# Patient Record
Sex: Male | Born: 1935 | Race: White | Hispanic: No | Smoking: Former smoker
Health system: Southern US, Community
[De-identification: ages and names within clinical notes are randomized; demographics above are authoritative.]

## PROBLEM LIST (undated history)

## (undated) DIAGNOSIS — T4145XA Adverse effect of unspecified anesthetic, initial encounter: Secondary | ICD-10-CM

## (undated) DIAGNOSIS — J309 Allergic rhinitis, unspecified: Secondary | ICD-10-CM

## (undated) DIAGNOSIS — Z87442 Personal history of urinary calculi: Secondary | ICD-10-CM

## (undated) DIAGNOSIS — S42002A Fracture of unspecified part of left clavicle, initial encounter for closed fracture: Secondary | ICD-10-CM

## (undated) DIAGNOSIS — E039 Hypothyroidism, unspecified: Secondary | ICD-10-CM

## (undated) DIAGNOSIS — K5790 Diverticulosis of intestine, part unspecified, without perforation or abscess without bleeding: Secondary | ICD-10-CM

## (undated) DIAGNOSIS — T8859XA Other complications of anesthesia, initial encounter: Secondary | ICD-10-CM

## (undated) DIAGNOSIS — I4510 Unspecified right bundle-branch block: Secondary | ICD-10-CM

## (undated) DIAGNOSIS — S329XXA Fracture of unspecified parts of lumbosacral spine and pelvis, initial encounter for closed fracture: Secondary | ICD-10-CM

## (undated) DIAGNOSIS — G4700 Insomnia, unspecified: Secondary | ICD-10-CM

## (undated) DIAGNOSIS — N200 Calculus of kidney: Secondary | ICD-10-CM

## (undated) DIAGNOSIS — H9190 Unspecified hearing loss, unspecified ear: Secondary | ICD-10-CM

## (undated) DIAGNOSIS — H269 Unspecified cataract: Secondary | ICD-10-CM

## (undated) DIAGNOSIS — E785 Hyperlipidemia, unspecified: Secondary | ICD-10-CM

## (undated) DIAGNOSIS — H4720 Unspecified optic atrophy: Secondary | ICD-10-CM

## (undated) DIAGNOSIS — I8390 Asymptomatic varicose veins of unspecified lower extremity: Secondary | ICD-10-CM

## (undated) DIAGNOSIS — M1990 Unspecified osteoarthritis, unspecified site: Secondary | ICD-10-CM

## (undated) HISTORY — DX: Fracture of unspecified part of left clavicle, initial encounter for closed fracture: S42.002A

## (undated) HISTORY — PX: INGUINAL HERNIA REPAIR: SUR1180

## (undated) HISTORY — DX: Unspecified optic atrophy: H47.20

## (undated) HISTORY — DX: Asymptomatic varicose veins of unspecified lower extremity: I83.90

## (undated) HISTORY — DX: Fracture of unspecified parts of lumbosacral spine and pelvis, initial encounter for closed fracture: S32.9XXA

## (undated) HISTORY — DX: Calculus of kidney: N20.0

## (undated) HISTORY — DX: Unspecified cataract: H26.9

## (undated) HISTORY — PX: THYROIDECTOMY: SHX17

## (undated) HISTORY — DX: Unspecified right bundle-branch block: I45.10

## (undated) HISTORY — DX: Hypothyroidism, unspecified: E03.9

## (undated) HISTORY — DX: Insomnia, unspecified: G47.00

## (undated) HISTORY — PX: OTHER SURGICAL HISTORY: SHX169

## (undated) HISTORY — DX: Allergic rhinitis, unspecified: J30.9

## (undated) HISTORY — DX: Hyperlipidemia, unspecified: E78.5

## (undated) HISTORY — DX: Unspecified osteoarthritis, unspecified site: M19.90

## (undated) HISTORY — DX: Unspecified hearing loss, unspecified ear: H91.90

## (undated) HISTORY — DX: Diverticulosis of intestine, part unspecified, without perforation or abscess without bleeding: K57.90

## (undated) SURGERY — Surgical Case
Anesthesia: *Unknown

---
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
## 1999-07-12 ENCOUNTER — Encounter: Payer: Self-pay | Admitting: Emergency Medicine

## 1999-07-12 ENCOUNTER — Inpatient Hospital Stay (HOSPITAL_COMMUNITY): Admission: EM | Admit: 1999-07-12 | Discharge: 1999-07-16 | Payer: Self-pay | Admitting: Emergency Medicine

## 1999-07-12 ENCOUNTER — Encounter: Payer: Self-pay | Admitting: Orthopedic Surgery

## 1999-07-13 ENCOUNTER — Encounter: Payer: Self-pay | Admitting: Orthopedic Surgery

## 2001-04-04 ENCOUNTER — Encounter: Payer: Self-pay | Admitting: Internal Medicine

## 2001-04-04 ENCOUNTER — Encounter: Admission: RE | Admit: 2001-04-04 | Discharge: 2001-04-04 | Payer: Self-pay | Admitting: Internal Medicine

## 2009-02-01 ENCOUNTER — Encounter: Admission: RE | Admit: 2009-02-01 | Discharge: 2009-02-01 | Payer: Self-pay | Admitting: Rheumatology

## 2009-03-30 HISTORY — PX: AORTIC VALVE REPLACEMENT: SHX41

## 2009-09-10 ENCOUNTER — Ambulatory Visit: Payer: Self-pay | Admitting: Surgery

## 2009-09-20 ENCOUNTER — Ambulatory Visit (HOSPITAL_COMMUNITY): Admission: RE | Admit: 2009-09-20 | Discharge: 2009-09-20 | Payer: Self-pay | Admitting: Cardiology

## 2009-10-29 ENCOUNTER — Ambulatory Visit: Payer: Self-pay | Admitting: Surgery

## 2009-10-30 ENCOUNTER — Encounter: Payer: Self-pay | Admitting: Surgery

## 2009-10-31 ENCOUNTER — Ambulatory Visit: Payer: Self-pay | Admitting: Surgery

## 2009-10-31 ENCOUNTER — Encounter: Payer: Self-pay | Admitting: Surgery

## 2009-10-31 ENCOUNTER — Inpatient Hospital Stay (HOSPITAL_COMMUNITY): Admission: RE | Admit: 2009-10-31 | Discharge: 2009-11-05 | Payer: Self-pay | Admitting: Surgery

## 2009-12-03 ENCOUNTER — Encounter: Admission: RE | Admit: 2009-12-03 | Discharge: 2009-12-03 | Payer: Self-pay | Admitting: Surgery

## 2009-12-03 ENCOUNTER — Ambulatory Visit: Payer: Self-pay | Admitting: Surgery

## 2010-01-02 ENCOUNTER — Encounter (HOSPITAL_COMMUNITY)
Admission: RE | Admit: 2010-01-02 | Discharge: 2010-04-02 | Payer: Self-pay | Source: Home / Self Care | Attending: Cardiology | Admitting: Cardiology

## 2010-04-03 ENCOUNTER — Encounter (HOSPITAL_COMMUNITY)
Admission: RE | Admit: 2010-04-03 | Discharge: 2010-04-29 | Payer: Self-pay | Source: Home / Self Care | Attending: Cardiology | Admitting: Cardiology

## 2010-04-30 ENCOUNTER — Ambulatory Visit (HOSPITAL_COMMUNITY): Payer: Self-pay

## 2010-05-02 ENCOUNTER — Ambulatory Visit (HOSPITAL_COMMUNITY): Payer: Self-pay

## 2010-05-05 ENCOUNTER — Ambulatory Visit (HOSPITAL_COMMUNITY): Payer: Self-pay

## 2010-05-07 ENCOUNTER — Ambulatory Visit (HOSPITAL_COMMUNITY): Payer: Self-pay

## 2010-05-09 ENCOUNTER — Ambulatory Visit (HOSPITAL_COMMUNITY): Payer: Self-pay

## 2010-05-12 ENCOUNTER — Ambulatory Visit (HOSPITAL_COMMUNITY): Payer: Self-pay

## 2010-05-14 ENCOUNTER — Ambulatory Visit (HOSPITAL_COMMUNITY): Payer: Self-pay

## 2010-05-16 ENCOUNTER — Ambulatory Visit (HOSPITAL_COMMUNITY): Payer: Self-pay

## 2010-05-19 ENCOUNTER — Ambulatory Visit (HOSPITAL_COMMUNITY): Payer: Self-pay

## 2010-05-21 ENCOUNTER — Ambulatory Visit (HOSPITAL_COMMUNITY): Payer: Self-pay

## 2010-05-23 ENCOUNTER — Ambulatory Visit (HOSPITAL_COMMUNITY): Payer: Self-pay

## 2010-05-26 ENCOUNTER — Ambulatory Visit (HOSPITAL_COMMUNITY): Payer: Self-pay

## 2010-05-28 ENCOUNTER — Ambulatory Visit (HOSPITAL_COMMUNITY): Payer: Self-pay

## 2010-05-30 ENCOUNTER — Ambulatory Visit (HOSPITAL_COMMUNITY): Payer: Self-pay

## 2010-06-02 ENCOUNTER — Ambulatory Visit (HOSPITAL_COMMUNITY): Payer: Self-pay

## 2010-06-04 ENCOUNTER — Ambulatory Visit (HOSPITAL_COMMUNITY): Payer: Self-pay

## 2010-06-06 ENCOUNTER — Ambulatory Visit (HOSPITAL_COMMUNITY): Payer: Self-pay

## 2010-06-09 ENCOUNTER — Ambulatory Visit (HOSPITAL_COMMUNITY): Payer: Self-pay

## 2010-06-11 ENCOUNTER — Ambulatory Visit (HOSPITAL_COMMUNITY): Payer: Self-pay

## 2010-06-13 ENCOUNTER — Ambulatory Visit (HOSPITAL_COMMUNITY): Payer: Self-pay

## 2010-06-13 LAB — BASIC METABOLIC PANEL
BUN: 14 mg/dL (ref 6–23)
BUN: 17 mg/dL (ref 6–23)
BUN: 21 mg/dL (ref 6–23)
BUN: 22 mg/dL (ref 6–23)
BUN: 23 mg/dL (ref 6–23)
BUN: 24 mg/dL — ABNORMAL HIGH (ref 6–23)
CO2: 25 mEq/L (ref 19–32)
CO2: 26 mEq/L (ref 19–32)
CO2: 26 mEq/L (ref 19–32)
CO2: 27 mEq/L (ref 19–32)
CO2: 27 mEq/L (ref 19–32)
CO2: 30 mEq/L (ref 19–32)
Calcium: 7.6 mg/dL — ABNORMAL LOW (ref 8.4–10.5)
Calcium: 7.6 mg/dL — ABNORMAL LOW (ref 8.4–10.5)
Calcium: 7.7 mg/dL — ABNORMAL LOW (ref 8.4–10.5)
Calcium: 7.8 mg/dL — ABNORMAL LOW (ref 8.4–10.5)
Calcium: 7.9 mg/dL — ABNORMAL LOW (ref 8.4–10.5)
Calcium: 8.6 mg/dL (ref 8.4–10.5)
Chloride: 104 mEq/L (ref 96–112)
Chloride: 106 mEq/L (ref 96–112)
Chloride: 108 mEq/L (ref 96–112)
Chloride: 108 mEq/L (ref 96–112)
Chloride: 109 mEq/L (ref 96–112)
Chloride: 112 mEq/L (ref 96–112)
Creatinine, Ser: 1.04 mg/dL (ref 0.4–1.5)
Creatinine, Ser: 1.08 mg/dL (ref 0.4–1.5)
Creatinine, Ser: 1.11 mg/dL (ref 0.4–1.5)
Creatinine, Ser: 1.12 mg/dL (ref 0.4–1.5)
Creatinine, Ser: 1.18 mg/dL (ref 0.4–1.5)
Creatinine, Ser: 1.29 mg/dL (ref 0.4–1.5)
GFR calc Af Amer: >60 mL/min (ref >60)
GFR calc Af Amer: >60 mL/min (ref >60)
GFR calc Af Amer: >60 mL/min (ref >60)
GFR calc Af Amer: >60 mL/min (ref >60)
GFR calc Af Amer: >60 mL/min (ref >60)
GFR calc Af Amer: >60 mL/min (ref >60)
GFR calc non Af Amer: 53 mL/min — ABNORMAL LOW (ref >60)
GFR calc non Af Amer: 59 mL/min — ABNORMAL LOW (ref >60)
GFR calc non Af Amer: >60 mL/min (ref >60)
GFR calc non Af Amer: >60 mL/min (ref >60)
GFR calc non Af Amer: >60 mL/min (ref >60)
GFR calc non Af Amer: >60 mL/min (ref >60)
Glucose, Bld: 118 mg/dL — ABNORMAL HIGH (ref 70–99)
Glucose, Bld: 119 mg/dL — ABNORMAL HIGH (ref 70–99)
Glucose, Bld: 120 mg/dL — ABNORMAL HIGH (ref 70–99)
Glucose, Bld: 128 mg/dL — ABNORMAL HIGH (ref 70–99)
Glucose, Bld: 129 mg/dL — ABNORMAL HIGH (ref 70–99)
Glucose, Bld: 143 mg/dL — ABNORMAL HIGH (ref 70–99)
Potassium: 3.5 mEq/L (ref 3.5–5.1)
Potassium: 3.6 mEq/L (ref 3.5–5.1)
Potassium: 3.7 mEq/L (ref 3.5–5.1)
Potassium: 3.9 mEq/L (ref 3.5–5.1)
Potassium: 3.9 mEq/L (ref 3.5–5.1)
Potassium: 3.9 mEq/L (ref 3.5–5.1)
Sodium: 139 mEq/L (ref 135–145)
Sodium: 139 mEq/L (ref 135–145)
Sodium: 141 mEq/L (ref 135–145)
Sodium: 141 mEq/L (ref 135–145)
Sodium: 143 mEq/L (ref 135–145)
Sodium: 144 mEq/L (ref 135–145)

## 2010-06-13 LAB — PLATELET COUNT: Platelets: 77 10*3/uL — ABNORMAL LOW (ref 150–400)

## 2010-06-13 LAB — CBC
HCT: 26.4 % — ABNORMAL LOW (ref 39.0–52.0)
HCT: 27.9 % — ABNORMAL LOW (ref 39.0–52.0)
HCT: 28.2 % — ABNORMAL LOW (ref 39.0–52.0)
HCT: 28.3 % — ABNORMAL LOW (ref 39.0–52.0)
HCT: 30.2 % — ABNORMAL LOW (ref 39.0–52.0)
HCT: 30.4 % — ABNORMAL LOW (ref 39.0–52.0)
HCT: 31.6 % — ABNORMAL LOW (ref 39.0–52.0)
HCT: 42.2 % (ref 39.0–52.0)
Hemoglobin: 10.5 g/dL — ABNORMAL LOW (ref 13.0–17.0)
Hemoglobin: 14.3 g/dL (ref 13.0–17.0)
Hemoglobin: 8.7 g/dL — ABNORMAL LOW (ref 13.0–17.0)
Hemoglobin: 9.3 g/dL — ABNORMAL LOW (ref 13.0–17.0)
Hemoglobin: 9.3 g/dL — ABNORMAL LOW (ref 13.0–17.0)
Hemoglobin: 9.5 g/dL — ABNORMAL LOW (ref 13.0–17.0)
Hemoglobin: 9.9 g/dL — ABNORMAL LOW (ref 13.0–17.0)
Hemoglobin: 9.9 g/dL — ABNORMAL LOW (ref 13.0–17.0)
MCH: 28.5 pg (ref 26.0–34.0)
MCH: 28.7 pg (ref 26.0–34.0)
MCH: 28.9 pg (ref 26.0–34.0)
MCH: 28.9 pg (ref 26.0–34.0)
MCH: 29 pg (ref 26.0–34.0)
MCH: 29 pg (ref 26.0–34.0)
MCH: 29.1 pg (ref 26.0–34.0)
MCH: 29.1 pg (ref 26.0–34.0)
MCHC: 32.6 g/dL (ref 30.0–36.0)
MCHC: 32.8 g/dL (ref 30.0–36.0)
MCHC: 32.9 g/dL (ref 30.0–36.0)
MCHC: 33 g/dL (ref 30.0–36.0)
MCHC: 33.2 g/dL (ref 30.0–36.0)
MCHC: 33.3 g/dL (ref 30.0–36.0)
MCHC: 33.7 g/dL (ref 30.0–36.0)
MCHC: 33.9 g/dL (ref 30.0–36.0)
MCV: 85.9 fL (ref 78.0–100.0)
MCV: 85.9 fL (ref 78.0–100.0)
MCV: 86 fL (ref 78.0–100.0)
MCV: 86.6 fL (ref 78.0–100.0)
MCV: 87.7 fL (ref 78.0–100.0)
MCV: 88.1 fL (ref 78.0–100.0)
MCV: 88.2 fL (ref 78.0–100.0)
MCV: 88.8 fL (ref 78.0–100.0)
Platelets: 108 10*3/uL — ABNORMAL LOW (ref 150–400)
Platelets: 125 10*3/uL — ABNORMAL LOW (ref 150–400)
Platelets: 72 10*3/uL — ABNORMAL LOW (ref 150–400)
Platelets: 84 10*3/uL — ABNORMAL LOW (ref 150–400)
Platelets: 89 10*3/uL — ABNORMAL LOW (ref 150–400)
Platelets: 90 10*3/uL — ABNORMAL LOW (ref 150–400)
Platelets: 98 10*3/uL — ABNORMAL LOW (ref 150–400)
Platelets: 99 10*3/uL — ABNORMAL LOW (ref 150–400)
RBC: 3.01 MIL/uL — ABNORMAL LOW (ref 4.22–5.81)
RBC: 3.21 MIL/uL — ABNORMAL LOW (ref 4.22–5.81)
RBC: 3.22 MIL/uL — ABNORMAL LOW (ref 4.22–5.81)
RBC: 3.28 MIL/uL — ABNORMAL LOW (ref 4.22–5.81)
RBC: 3.4 MIL/uL — ABNORMAL LOW (ref 4.22–5.81)
RBC: 3.45 MIL/uL — ABNORMAL LOW (ref 4.22–5.81)
RBC: 3.68 MIL/uL — ABNORMAL LOW (ref 4.22–5.81)
RBC: 4.91 MIL/uL (ref 4.22–5.81)
RDW: 14 % (ref 11.5–15.5)
RDW: 14 % (ref 11.5–15.5)
RDW: 14.1 % (ref 11.5–15.5)
RDW: 14.5 % (ref 11.5–15.5)
RDW: 14.7 % (ref 11.5–15.5)
RDW: 14.7 % (ref 11.5–15.5)
RDW: 14.8 % (ref 11.5–15.5)
RDW: 15 % (ref 11.5–15.5)
WBC: 12.1 10*3/uL — ABNORMAL HIGH (ref 4.0–10.5)
WBC: 12.9 10*3/uL — ABNORMAL HIGH (ref 4.0–10.5)
WBC: 6.5 10*3/uL (ref 4.0–10.5)
WBC: 7 10*3/uL (ref 4.0–10.5)
WBC: 8.3 10*3/uL (ref 4.0–10.5)
WBC: 8.9 10*3/uL (ref 4.0–10.5)
WBC: 9.2 10*3/uL (ref 4.0–10.5)
WBC: 9.7 10*3/uL (ref 4.0–10.5)

## 2010-06-13 LAB — HEMOGLOBIN A1C
Hgb A1c MFr Bld: 5.8 % — ABNORMAL HIGH (ref <5.7)
Mean Plasma Glucose: 120 mg/dL — ABNORMAL HIGH (ref <117)

## 2010-06-13 LAB — GLUCOSE, CAPILLARY
Glucose-Capillary: 100 mg/dL — ABNORMAL HIGH (ref 70–99)
Glucose-Capillary: 100 mg/dL — ABNORMAL HIGH (ref 70–99)
Glucose-Capillary: 105 mg/dL — ABNORMAL HIGH (ref 70–99)
Glucose-Capillary: 108 mg/dL — ABNORMAL HIGH (ref 70–99)
Glucose-Capillary: 117 mg/dL — ABNORMAL HIGH (ref 70–99)
Glucose-Capillary: 120 mg/dL — ABNORMAL HIGH (ref 70–99)
Glucose-Capillary: 132 mg/dL — ABNORMAL HIGH (ref 70–99)
Glucose-Capillary: 132 mg/dL — ABNORMAL HIGH (ref 70–99)
Glucose-Capillary: 136 mg/dL — ABNORMAL HIGH (ref 70–99)
Glucose-Capillary: 138 mg/dL — ABNORMAL HIGH (ref 70–99)
Glucose-Capillary: 152 mg/dL — ABNORMAL HIGH (ref 70–99)
Glucose-Capillary: 179 mg/dL — ABNORMAL HIGH (ref 70–99)
Glucose-Capillary: 208 mg/dL — ABNORMAL HIGH (ref 70–99)

## 2010-06-13 LAB — POCT I-STAT 3, ART BLOOD GAS (G3+)
Acid-base deficit: 1 mmol/L (ref 0.0–2.0)
Acid-base deficit: 1 mmol/L (ref 0.0–2.0)
Acid-base deficit: 2 mmol/L (ref 0.0–2.0)
Bicarbonate: 23.2 mEq/L (ref 20.0–24.0)
Bicarbonate: 23.5 mEq/L (ref 20.0–24.0)
Bicarbonate: 24.1 mEq/L — ABNORMAL HIGH (ref 20.0–24.0)
O2 Saturation: 100 %
O2 Saturation: 94 %
O2 Saturation: 96 %
Patient temperature: 35.4
Patient temperature: 36.9
TCO2: 24 mmol/L (ref 0–100)
TCO2: 25 mmol/L (ref 0–100)
TCO2: 25 mmol/L (ref 0–100)
pCO2 arterial: 35.8 mmHg (ref 35.0–45.0)
pCO2 arterial: 36.3 mmHg (ref 35.0–45.0)
pCO2 arterial: 39.9 mmHg (ref 35.0–45.0)
pH, Arterial: 7.373 (ref 7.350–7.450)
pH, Arterial: 7.422 (ref 7.350–7.450)
pH, Arterial: 7.425 (ref 7.350–7.450)
pO2, Arterial: 378 mmHg — ABNORMAL HIGH (ref 80.0–100.0)
pO2, Arterial: 65 mmHg — ABNORMAL LOW (ref 80.0–100.0)
pO2, Arterial: 83 mmHg (ref 80.0–100.0)

## 2010-06-13 LAB — URINALYSIS, ROUTINE W REFLEX MICROSCOPIC
Glucose, UA: NEGATIVE mg/dL
Hgb urine dipstick: NEGATIVE
Ketones, ur: 15 mg/dL — AB
Nitrite: NEGATIVE
Protein, ur: NEGATIVE mg/dL
Specific Gravity, Urine: 1.025 (ref 1.005–1.030)
Urobilinogen, UA: 1 mg/dL (ref 0.0–1.0)
pH: 5 (ref 5.0–8.0)

## 2010-06-13 LAB — TYPE AND SCREEN
ABO/RH(D): O NEG
Antibody Screen: NEGATIVE

## 2010-06-13 LAB — CREATININE, SERUM
Creatinine, Ser: 0.92 mg/dL (ref 0.4–1.5)
Creatinine, Ser: 1.28 mg/dL (ref 0.4–1.5)
GFR calc Af Amer: >60 mL/min (ref >60)
GFR calc Af Amer: >60 mL/min (ref >60)
GFR calc non Af Amer: 54 mL/min — ABNORMAL LOW (ref >60)
GFR calc non Af Amer: >60 mL/min (ref >60)

## 2010-06-13 LAB — MAGNESIUM
Magnesium: 2.4 mg/dL (ref 1.5–2.5)
Magnesium: 2.5 mg/dL (ref 1.5–2.5)
Magnesium: 2.8 mg/dL — ABNORMAL HIGH (ref 1.5–2.5)

## 2010-06-13 LAB — BLOOD GAS, ARTERIAL
Acid-base deficit: 0.5 mmol/L (ref 0.0–2.0)
Bicarbonate: 23.3 mEq/L (ref 20.0–24.0)
Drawn by: 181601
FIO2: 0.21 %
O2 Saturation: 95.8 %
Patient temperature: 98.6
TCO2: 24.4 mmol/L (ref 0–100)
pCO2 arterial: 36 mmHg (ref 35.0–45.0)
pH, Arterial: 7.428 (ref 7.350–7.450)
pO2, Arterial: 79.2 mmHg — ABNORMAL LOW (ref 80.0–100.0)

## 2010-06-13 LAB — POCT I-STAT 4, (NA,K, GLUC, HGB,HCT)
Glucose, Bld: 106 mg/dL — ABNORMAL HIGH (ref 70–99)
Glucose, Bld: 107 mg/dL — ABNORMAL HIGH (ref 70–99)
Glucose, Bld: 112 mg/dL — ABNORMAL HIGH (ref 70–99)
Glucose, Bld: 113 mg/dL — ABNORMAL HIGH (ref 70–99)
Glucose, Bld: 121 mg/dL — ABNORMAL HIGH (ref 70–99)
Glucose, Bld: 97 mg/dL (ref 70–99)
HCT: 26 % — ABNORMAL LOW (ref 39.0–52.0)
HCT: 26 % — ABNORMAL LOW (ref 39.0–52.0)
HCT: 27 % — ABNORMAL LOW (ref 39.0–52.0)
HCT: 30 % — ABNORMAL LOW (ref 39.0–52.0)
HCT: 32 % — ABNORMAL LOW (ref 39.0–52.0)
HCT: 36 % — ABNORMAL LOW (ref 39.0–52.0)
Hemoglobin: 10.2 g/dL — ABNORMAL LOW (ref 13.0–17.0)
Hemoglobin: 10.9 g/dL — ABNORMAL LOW (ref 13.0–17.0)
Hemoglobin: 12.2 g/dL — ABNORMAL LOW (ref 13.0–17.0)
Hemoglobin: 8.8 g/dL — ABNORMAL LOW (ref 13.0–17.0)
Hemoglobin: 8.8 g/dL — ABNORMAL LOW (ref 13.0–17.0)
Hemoglobin: 9.2 g/dL — ABNORMAL LOW (ref 13.0–17.0)
Potassium: 3.9 mEq/L (ref 3.5–5.1)
Potassium: 3.9 mEq/L (ref 3.5–5.1)
Potassium: 4 mEq/L (ref 3.5–5.1)
Potassium: 4 mEq/L (ref 3.5–5.1)
Potassium: 4.5 mEq/L (ref 3.5–5.1)
Potassium: 6 mEq/L — ABNORMAL HIGH (ref 3.5–5.1)
Sodium: 135 mEq/L (ref 135–145)
Sodium: 136 mEq/L (ref 135–145)
Sodium: 139 mEq/L (ref 135–145)
Sodium: 141 mEq/L (ref 135–145)
Sodium: 141 mEq/L (ref 135–145)
Sodium: 141 mEq/L (ref 135–145)

## 2010-06-13 LAB — HEMOGLOBIN AND HEMATOCRIT, BLOOD
HCT: 26.5 % — ABNORMAL LOW (ref 39.0–52.0)
Hemoglobin: 9 g/dL — ABNORMAL LOW (ref 13.0–17.0)

## 2010-06-13 LAB — COMPREHENSIVE METABOLIC PANEL
ALT: 20 U/L (ref 0–53)
AST: 27 U/L (ref 0–37)
Albumin: 3.8 g/dL (ref 3.5–5.2)
Alkaline Phosphatase: 70 U/L (ref 39–117)
BUN: 18 mg/dL (ref 6–23)
CO2: 22 mEq/L (ref 19–32)
Calcium: 9.1 mg/dL (ref 8.4–10.5)
Chloride: 109 mEq/L (ref 96–112)
Creatinine, Ser: 1 mg/dL (ref 0.4–1.5)
GFR calc Af Amer: >60 mL/min (ref >60)
GFR calc non Af Amer: >60 mL/min (ref >60)
Glucose, Bld: 137 mg/dL — ABNORMAL HIGH (ref 70–99)
Potassium: 4.3 mEq/L (ref 3.5–5.1)
Sodium: 138 mEq/L (ref 135–145)
Total Bilirubin: 0.7 mg/dL (ref 0.3–1.2)
Total Protein: 6.8 g/dL (ref 6.0–8.3)

## 2010-06-13 LAB — POCT I-STAT, CHEM 8
BUN: 14 mg/dL (ref 6–23)
Calcium, Ion: 1.11 mmol/L — ABNORMAL LOW (ref 1.12–1.32)
Chloride: 109 mEq/L (ref 96–112)
Creatinine, Ser: 1 mg/dL (ref 0.4–1.5)
Glucose, Bld: 143 mg/dL — ABNORMAL HIGH (ref 70–99)
HCT: 25 % — ABNORMAL LOW (ref 39.0–52.0)
Hemoglobin: 8.5 g/dL — ABNORMAL LOW (ref 13.0–17.0)
Potassium: 3.9 mEq/L (ref 3.5–5.1)
Sodium: 140 mEq/L (ref 135–145)
TCO2: 22 mmol/L (ref 0–100)

## 2010-06-13 LAB — SURGICAL PCR SCREEN
MRSA, PCR: NEGATIVE
Staphylococcus aureus: NEGATIVE

## 2010-06-13 LAB — PREPARE PLATELETS

## 2010-06-13 LAB — PROTIME-INR
INR: 1.08 (ref 0.00–1.49)
INR: 1.84 — ABNORMAL HIGH (ref 0.00–1.49)
Prothrombin Time: 14.2 seconds (ref 11.6–15.2)
Prothrombin Time: 21.4 seconds — ABNORMAL HIGH (ref 11.6–15.2)

## 2010-06-13 LAB — APTT
aPTT: 26 seconds (ref 24–37)
aPTT: 42 seconds — ABNORMAL HIGH (ref 24–37)

## 2010-06-13 LAB — ABO/RH: ABO/RH(D): O NEG

## 2010-06-15 LAB — POCT I-STAT 3, ART BLOOD GAS (G3+)
Bicarbonate: 23.9 mEq/L (ref 20.0–24.0)
O2 Saturation: 94 %
TCO2: 25 mmol/L (ref 0–100)
pCO2 arterial: 34.9 mmHg — ABNORMAL LOW (ref 35.0–45.0)
pH, Arterial: 7.444 (ref 7.350–7.450)
pO2, Arterial: 66 mmHg — ABNORMAL LOW (ref 80.0–100.0)

## 2010-06-15 LAB — POCT I-STAT 3, VENOUS BLOOD GAS (G3P V)
Acid-Base Excess: 1 mmol/L (ref 0.0–2.0)
Bicarbonate: 26 mEq/L — ABNORMAL HIGH (ref 20.0–24.0)
O2 Saturation: 60 %
TCO2: 27 mmol/L (ref 0–100)
pCO2, Ven: 43.5 mmHg — ABNORMAL LOW (ref 45.0–50.0)
pH, Ven: 7.385 — ABNORMAL HIGH (ref 7.250–7.300)
pO2, Ven: 32 mmHg (ref 30.0–45.0)

## 2010-06-16 ENCOUNTER — Ambulatory Visit (HOSPITAL_COMMUNITY): Payer: Self-pay

## 2010-06-18 ENCOUNTER — Ambulatory Visit (HOSPITAL_COMMUNITY): Payer: Self-pay

## 2010-06-20 ENCOUNTER — Ambulatory Visit (HOSPITAL_COMMUNITY): Payer: Self-pay

## 2010-06-23 ENCOUNTER — Ambulatory Visit (HOSPITAL_COMMUNITY): Payer: Self-pay

## 2010-06-25 ENCOUNTER — Ambulatory Visit (HOSPITAL_COMMUNITY): Payer: Self-pay

## 2010-06-27 ENCOUNTER — Ambulatory Visit (HOSPITAL_COMMUNITY): Payer: Self-pay

## 2010-06-30 ENCOUNTER — Ambulatory Visit (HOSPITAL_COMMUNITY): Payer: Self-pay

## 2010-07-02 ENCOUNTER — Ambulatory Visit (HOSPITAL_COMMUNITY): Payer: Self-pay

## 2010-07-04 ENCOUNTER — Ambulatory Visit (HOSPITAL_COMMUNITY): Payer: Self-pay

## 2010-07-07 ENCOUNTER — Ambulatory Visit (HOSPITAL_COMMUNITY): Payer: Self-pay

## 2010-07-09 ENCOUNTER — Ambulatory Visit (HOSPITAL_COMMUNITY): Payer: Self-pay

## 2010-07-11 ENCOUNTER — Ambulatory Visit (HOSPITAL_COMMUNITY): Payer: Self-pay

## 2010-07-14 ENCOUNTER — Ambulatory Visit (HOSPITAL_COMMUNITY): Payer: Self-pay

## 2010-07-16 ENCOUNTER — Ambulatory Visit (HOSPITAL_COMMUNITY): Payer: Self-pay

## 2010-07-18 ENCOUNTER — Ambulatory Visit (HOSPITAL_COMMUNITY): Payer: Self-pay

## 2010-07-21 ENCOUNTER — Ambulatory Visit (HOSPITAL_COMMUNITY): Payer: Self-pay

## 2010-07-23 ENCOUNTER — Ambulatory Visit (HOSPITAL_COMMUNITY): Payer: Self-pay

## 2010-07-25 ENCOUNTER — Ambulatory Visit (HOSPITAL_COMMUNITY): Payer: Self-pay

## 2010-07-28 ENCOUNTER — Ambulatory Visit (HOSPITAL_COMMUNITY): Payer: Self-pay

## 2010-07-30 ENCOUNTER — Ambulatory Visit (HOSPITAL_COMMUNITY): Payer: Self-pay

## 2010-08-01 ENCOUNTER — Ambulatory Visit (HOSPITAL_COMMUNITY): Payer: Self-pay

## 2010-08-04 ENCOUNTER — Ambulatory Visit (HOSPITAL_COMMUNITY): Payer: Self-pay

## 2010-08-06 ENCOUNTER — Ambulatory Visit (HOSPITAL_COMMUNITY): Payer: Self-pay

## 2010-08-08 ENCOUNTER — Ambulatory Visit (HOSPITAL_COMMUNITY): Payer: Self-pay

## 2010-08-11 ENCOUNTER — Ambulatory Visit (HOSPITAL_COMMUNITY): Payer: Self-pay

## 2010-08-12 NOTE — Consult Note (Signed)
NEW PATIENT CONSULTATION   ELJAY, LAVE  DOB:  [birth year]                                        September 11, 2009  CHART #:  [id number]   REASON FOR CONSULTATION:  Severe aortic stenosis.   CLINICAL HISTORY:  I was asked by Dr. Anne Fu to evaluate the patient for  consideration of aortic valve replacement.  He is an 77 year old  gentleman with a long history of heart murmur, who says he was referred  to Dr. Anne Fu recently by his primary physician, Dr. Donette Larry, due to  worsening of his heart murmur.  He had an echocardiogram performed on  July 26, 2009, which showed mild concentric left ventricular  hypertrophy with severe aortic stenosis with a peak gradient of 4 meters  per second and mean gradient of 35 mmHg with severe thickening and  calcification of the aortic valve.  When compared to a previous  echocardiogram in 2008, the aortic stenosis had worsened and was now  severe with an aortic valve area 0.85 sq. cm.  There was mild aortic  insufficiency, mild mitral annular calcification with trace mitral  regurgitation, and trivial tricuspid regurgitation.  He also underwent  an exercise treadmill test which only lasted about two and half minutes  and he had severely decreased exercise tolerance.   REVIEW OF SYSTEMS:  GENERAL:  He denies any fever or chills.  He has had  no recent weight changes.  He does report fatigue.  EYES:  Negative.  ENT:  Negative.  He does see a dentist regularly every 6 months.  ENDOCRINE:  He denies diabetes.  He does have a history of  hypothyroidism.  CARDIOVASCULAR:  He denies any chest pain or pressure.  He has had  exertional dyspnea.  Denies PND and orthopnea.  He denies peripheral  edema and palpitations.  RESPIRATORY:  He denies cough and sputum production.  He does have a  history of asthma with some wheezing and bronchitis.  GI:  He denies nausea or vomiting.  Denies melena and bright red blood  per rectum.  He does have  a history of reflux.  GU:  He denies dysuria and hematuria.  NEUROLOGIC:  Denies any focal weakness or numbness.  Denies dizziness  and syncope.  He has never had a TIA or a stroke.  He does have  headaches.  VASCULAR:  He denies claudication.  He does have a history of varicose  veins and phlebitis.  PSYCHIATRIC:  Negative.  HEMATOLOGIC:  Negative.  MUSCULOSKELETAL:  He does have arthritis.   ALLERGIES:  Penicillin causes a rash and hives and Aleve causes a rash.   MEDICATIONS:  1. Albuterol nebulizer p.r.n.  2. Aspirin 81 mg daily.  3. Saw palmetto 500 mg daily.  4. Zyrtec 10 mg daily.  5. Lipitor 10 mg daily.  6. Synthroid 88 mcg daily.  7. Zolpidem 5 mg daily.  8. Fiber capsule daily.  9. Celebrex 200 mg p.r.n.   PAST MEDICAL HISTORY:  Significant for aortic stenosis.  He has a  history of hypercholesterolemia.  He has a history of hypothyroidism.  He has a history of arthritis.  He has a history of diverticulosis.  He  has history of varicose veins.  He had trauma in the past with a pelvic  fracture and a left clavicle fracture.  He  has a history hearing loss  and wears hearing aids.  He has history of kidney stones.  He has  history of cataracts and had surgery in 2010.  He is status post surgery  for a thyroid goiter in the distant past.   SOCIAL HISTORY:  He is married and has 3 children.  He is retired.  He  is a previous smoker but quit in 1971.  Denies alcohol use.   FAMILY HISTORY:  Negative for cardiac or valve disease.   PHYSICAL EXAMINATION:  Blood pressure is 139/85, pulse is 62 and  regular, respiratory rate is 18 and unlabored.  Oxygen saturation on  room air is 93%.  He is a well-developed elderly white male in no  distress.  HEENT exam shows him to be normocephalic and atraumatic.  Pupils are equal and reactive to light and accommodation.  Extraocular  muscles are intact.  Throat is clear.  Teeth are in good condition.  Neck exam shows normal carotid  pulses bilaterally.  There is a  transmitted murmur to both sides of his neck.  There is no adenopathy or  thyromegaly.  Cardiac exam shows a regular rate and rhythm with a grade  3/6 holosystolic murmur over the aorta.  There is no diastolic murmur.  His lungs are clear.  Abdominal exam shows active bowel sounds.  His  abdomen is soft and nontender.  There are no palpable masses or  organomegaly.  Extremity exam shows no peripheral edema.  He does have  bilateral varicose veins, particularly in the lower legs.  Pedal pulses  are palpable bilaterally.  Skin is warm and dry.  Neurologic exam shows  him to be alert and oriented x3.  Motor and sensory exams are grossly  normal.   IMPRESSION:  The patient has severe aortic stenosis and mild aortic  insufficiency with symptoms of exertional dyspnea and fatigue as well as  severely decreased exercise tolerance on stress testing.  He is in  overall good condition and I agree at this time to proceed with aortic  valve replacement.  He will require cardiac catheterization first, and  given his age I think it would be best to do his catheterization and  wait at least a week before doing his surgery to minimize any insult to  his kidney function.  Hopefully, he can be cathed as an outpatient or an  overnight stay and then I will see him back in the office afterwards to  discuss of surgery further with him and make operative plans.  I told  him I would like to get his surgery done as quickly as possible, but I  would like to wait at least 5 days in between his cath and the surgery  if possible.  I discussed my recommendation for a tissue valve given his  age.  We discussed benefits and risks of surgery including but not  limited to bleeding, blood transfusion, infection, stroke, myocardial  infarction, heart block requiring permanent pacemaker, organ  dysfunction, and death.  He understands all this and agrees to proceed.  I will see him back after  his catheterization.   Evelene Croon, M.D.  Electronically Signed   BB/MEDQ  D:  09/11/2009  T:  09/12/2009  Job:  [id number]   cc:   Jake Bathe, MD  Georgann Housekeeper, MD

## 2010-08-12 NOTE — Assessment & Plan Note (Signed)
OFFICE VISIT   MONTERRIUS, CARDOSA  DOB:  March 22, 1926                                        December 03, 2009  CHART #:  [id number]   HISTORY:  The patient returned to my office today for followup status  post aortic valve replacement with a 23-mm Edwards pericardial valve on  October 31, 2009.  He has been feeling well overall and is walking daily  without chest pain or shortness of breath.   PHYSICAL EXAMINATION:  Blood pressure 111/69, pulse 71 and regular,  respiratory rate is 18 unlabored.  Oxygen saturation on room air is 96%.  He looks well.  Cardiac exam shows regular rate and rhythm with normal  valve sounds.  There is no murmur.  His lungs are clear.  Chest incision  is healing well and sternum is stable.  There is no peripheral edema.   Followup chest x-ray today shows clear lung fields and no pleural  effusions.   MEDICATIONS:  Albuterol HFA b.i.d. p.r.n., aspirin 81 mg daily, Zyrtec  10 mg daily, Lipitor 10 mg daily, Synthroid 88 mcg daily, his fiber  capsule daily, Lopressor 12.5 mg b.i.d., Ultram p.r.n. for pain, Maalox  q.4 h. p.r.n. for ingestion, and saw palmetto 500 mg daily.   IMPRESSION:  Overall, the patient is making a good recovery following  his surgery.  I encouraged him to continue increasing his activity.  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  He is planning on participating an outpatient cardiac rehab.  He will follow up with Dr. Anne Fu and will contact me if he develops any  problems with his incisions.   Evelene Croon, M.D.  Electronically Signed   BB/MEDQ  D:  12/03/2009  T:  12/04/2009  Job:  [id number]   cc:   Jake Bathe, MD  Tyson Dense, MD

## 2010-08-12 NOTE — Assessment & Plan Note (Signed)
OFFICE VISIT   Jay [name], Jay [name]  DOB:  [birth year]                                        October 29, 2009  CHART #:  [id number]   HISTORY:  The patient returned to my office today for further discussion  of planned aortic valve replacement scheduled for Thursday, October 31, 2009.  He underwent cardiac catheterization on September 20, 2009, which I  have reviewed.  This showed insignificant coronary disease.  The LAD had  about 30% proximal stenosis with some ectatic portion in the mid  segment.  The left circumflex had some aneurysmal dilatation, but no  stenosis.  The right coronary artery was a small nondominant vessel.  Left ventriculogram was not performed.  Right heart cath showed normal  pulmonary pressure of 30/15 and a wedge pressure of 10/12 with a mean of  8.  Cardiac output was 4 L/min with a pulmonary artery oxygen saturation  of 60% and an aortic saturation 94%.   The patient said that he is stable symptomatically.  He has not been  doing very much lately because he does get exertional dyspnea.  He has  had no chest pain or pressure.  He denies dizziness or syncope.   PHYSICAL EXAMINATION:  Today, his blood pressure is 127/82 with pulse of  84 and regular.  Respiratory rate is 16, unlabored.  Oxygen saturation  is 93% on room air.  He looks well.  Cardiac exam shows regular rate and  rhythm with a normal S1 and S2.  There is a grade 3/6 systolic murmur  over the aorta.  There is no diastolic murmur.  His lungs are clear.  There is no peripheral edema.   IMPRESSION:  The patient is stable for aortic valve replacement on  Thursday.  He has insignificant coronary disease.  We will plan to use a  tissue valve at his age of 62.  I discussed the operative procedure with  him including alternatives, benefits, and risks including, but not  limited to bleeding, blood transfusion, infection, stroke, myocardial  infarction, heart block requiring permanent  pacemaker, and death.  He  understands all this and agrees to proceed.   Evelene Croon, M.D.  Electronically Signed   BB/MEDQ  D:  10/29/2009  T:  10/30/2009  Job:  [id number]

## 2010-08-13 ENCOUNTER — Ambulatory Visit (HOSPITAL_COMMUNITY): Payer: Self-pay

## 2010-08-15 ENCOUNTER — Ambulatory Visit (HOSPITAL_COMMUNITY): Payer: Self-pay

## 2010-08-15 NOTE — H&P (Signed)
[name]. [facility]  Patient:    Jay [name], Jay [name]                MRN: [id number] Adm. Date:  [id number] Disc. Date: [id number] Attending:  Wende Mott                         History and Physical  CHIEF COMPLAINT:  Painful left hip.  HISTORY OF PRESENT ILLNESS:  This is a 77 year old male who had fallen 8 feet from a ladder today and landed onto his left side.  The patient denies any loss of consciousness, but felt dazed for a while.  The patient states that he hit the eft side of his head, but it was not on the concrete, but on the grassy surface. The patient was brought to [facility] Emergency Room for treatment by ambulance.  PAST MEDICAL HISTORY:  Asthmatic bronchitis.  Hypothyroidism.  ALLERGIES:  Penicillin.  MEDICATIONS:  Glucosamine and Synthroid.  FAMILY HISTORY:  Noncontributory.  HABITS:  None.  REVIEW OF SYSTEMS:  No cardiac, respiratory, urinary or bowel symptoms.  PHYSICAL EXAMINATION:  VITAL SIGNS:  Temperature 97.8.  Pulse 75.  Respirations 16.  Blood pressure 138/81.  GENERAL:  Alert and oriented.  No acute distress.  NECK:  Left paracervical muscle tenderness.  Range of motion is good but guarded on rotation.  NEUROLOGIC:  Good grip and pinch in both upper extremities.  Intrinsically intact. Sensory is intact.  CHEST:  Clear.  Nontender.  ABDOMEN:  Soft.  Active bowel sounds.  Nontender.  PELVIS:  Tender in the left SI joint and left inguinal area.  BACK AND SPINE:  Dorsal spine nontender.  Lumbar spine showed left paralumbar tenderness and SI joint.  EXTREMITIES:  Straight leg raise and ______ negative.  No clonus.  Extensor hallux intact.  Left elbow abrasion.  Bruise, left forearm.  LABORATORY DATA:  X-ray revealed fracture left ischial ramus.  IMPRESSION: 1. Cervical strain. 2. Lumbar strain. 3. Strain, left sacroiliac joint. 4. Abrasion, left elbow. 5. Contusion, head, rule  out subdural. 6. Fractured pelvis, non-displaced. DD:  07/29/99 TD:  07/30/99 Job: [id number]

## 2010-08-15 NOTE — Discharge Summary (Signed)
[place]. [facility]  Patient:    Jay [name], Jay [name]                MRN: [id number] Adm. Date:  [id number] Disc. Date: [id number] Attending:  Wende Mott                           Discharge Summary  ADMITTING DIAGNOSES: 1. Fractured left pelvis. 2. Cervical strain. 3. Lumbar strain. 4. Strain, left sacroiliac joint. 5. Abrasion, right elbow. 6. Contused head, rule out subdural.  DISCHARGE DIAGNOSES: 1. Cervical strain. 2. Lumbar strain. 3. Strain, left sacroiliac joint. 4. Abrasion, right elbow. 5. Contusion, head. 6. Fractured left pelvis.  COMPLICATIONS:  None.  INFECTIONS:  None.  OPERATIONS:  None.  PERTINENT HISTORY:  This is a 77 year old male who was on a ladder and had an eight foot fall.  The patient was brought to [facility] Emergency Room for treatment.  The patient complained of left hip and pelvic pain.  PERTINENT PHYSICAL EXAMINATION:  NECK:  Paracervical muscle tenderness.  NEUROLOGIC:  Intact.  BACK AND SPINE:  Lumbar spine showed left paralumbar muscle tenderness and SI joint tenderness.  EXTREMITIES:  Abrasion about the right elbow.  Bruise about the left forearm.  LABORATORY DATA:  X-rays revealed a fractured left ischial ramus.  HOSPITAL COURSE:  The patient was placed on strict bed rest with flexion of the knee of the bed, pain medication, muscle relaxers, anti-inflammatories. The patient was started on ambulation, non weight bearing on the left side with the use of a walker and progressed quite well.  The patient became stable enough to be discharged and was discharged on Celebrex 200 mg q.d., baby aspiring q.d., Skelaxin two tablets b.i.d. moist heat and a pillow beneath the knees at home, non weight bearing on the left side with the use of crutches. The patient is to return to the office in two weeks. DD:  07/29/99 TD:  07/30/99 Job: [id number]

## 2010-08-18 ENCOUNTER — Ambulatory Visit (HOSPITAL_COMMUNITY): Payer: Self-pay

## 2010-08-20 ENCOUNTER — Ambulatory Visit (HOSPITAL_COMMUNITY): Payer: Self-pay

## 2010-08-22 ENCOUNTER — Ambulatory Visit (HOSPITAL_COMMUNITY): Payer: Self-pay

## 2010-08-25 ENCOUNTER — Ambulatory Visit (HOSPITAL_COMMUNITY): Payer: Self-pay

## 2010-08-27 ENCOUNTER — Ambulatory Visit (HOSPITAL_COMMUNITY): Payer: Self-pay

## 2010-08-29 ENCOUNTER — Ambulatory Visit (HOSPITAL_COMMUNITY): Payer: Self-pay

## 2010-09-01 ENCOUNTER — Ambulatory Visit (HOSPITAL_COMMUNITY): Payer: Self-pay

## 2011-08-11 DIAGNOSIS — E039 Hypothyroidism, unspecified: Secondary | ICD-10-CM | POA: Diagnosis not present

## 2011-08-11 DIAGNOSIS — R7309 Other abnormal glucose: Secondary | ICD-10-CM | POA: Diagnosis not present

## 2011-08-11 DIAGNOSIS — E782 Mixed hyperlipidemia: Secondary | ICD-10-CM | POA: Diagnosis not present

## 2011-08-11 DIAGNOSIS — N182 Chronic kidney disease, stage 2 (mild): Secondary | ICD-10-CM | POA: Diagnosis not present

## 2011-08-11 DIAGNOSIS — Z1331 Encounter for screening for depression: Secondary | ICD-10-CM | POA: Diagnosis not present

## 2011-08-11 DIAGNOSIS — J45909 Unspecified asthma, uncomplicated: Secondary | ICD-10-CM | POA: Diagnosis not present

## 2011-09-17 DIAGNOSIS — Z954 Presence of other heart-valve replacement: Secondary | ICD-10-CM | POA: Diagnosis not present

## 2011-09-17 DIAGNOSIS — I359 Nonrheumatic aortic valve disorder, unspecified: Secondary | ICD-10-CM | POA: Diagnosis not present

## 2011-09-17 DIAGNOSIS — E782 Mixed hyperlipidemia: Secondary | ICD-10-CM | POA: Diagnosis not present

## 2011-11-02 DIAGNOSIS — D485 Neoplasm of uncertain behavior of skin: Secondary | ICD-10-CM | POA: Diagnosis not present

## 2011-11-02 DIAGNOSIS — C44390 Other specified malignant neoplasm of skin of unspecified parts of face: Secondary | ICD-10-CM | POA: Diagnosis not present

## 2011-11-02 DIAGNOSIS — L821 Other seborrheic keratosis: Secondary | ICD-10-CM | POA: Diagnosis not present

## 2011-11-02 DIAGNOSIS — R238 Other skin changes: Secondary | ICD-10-CM | POA: Diagnosis not present

## 2011-11-02 DIAGNOSIS — Z85828 Personal history of other malignant neoplasm of skin: Secondary | ICD-10-CM | POA: Diagnosis not present

## 2011-11-19 DIAGNOSIS — H2510 Age-related nuclear cataract, unspecified eye: Secondary | ICD-10-CM | POA: Diagnosis not present

## 2011-12-03 DIAGNOSIS — C44390 Other specified malignant neoplasm of skin of unspecified parts of face: Secondary | ICD-10-CM | POA: Diagnosis not present

## 2012-02-08 DIAGNOSIS — Z0000 Encounter for general adult medical examination without abnormal findings: Secondary | ICD-10-CM | POA: Diagnosis not present

## 2012-02-08 DIAGNOSIS — E782 Mixed hyperlipidemia: Secondary | ICD-10-CM | POA: Diagnosis not present

## 2012-02-08 DIAGNOSIS — N182 Chronic kidney disease, stage 2 (mild): Secondary | ICD-10-CM | POA: Diagnosis not present

## 2012-02-08 DIAGNOSIS — E039 Hypothyroidism, unspecified: Secondary | ICD-10-CM | POA: Diagnosis not present

## 2012-02-08 DIAGNOSIS — J45909 Unspecified asthma, uncomplicated: Secondary | ICD-10-CM | POA: Diagnosis not present

## 2012-02-08 DIAGNOSIS — Z23 Encounter for immunization: Secondary | ICD-10-CM | POA: Diagnosis not present

## 2012-02-08 DIAGNOSIS — Z1331 Encounter for screening for depression: Secondary | ICD-10-CM | POA: Diagnosis not present

## 2012-02-08 DIAGNOSIS — R7309 Other abnormal glucose: Secondary | ICD-10-CM | POA: Diagnosis not present

## 2012-02-08 DIAGNOSIS — I359 Nonrheumatic aortic valve disorder, unspecified: Secondary | ICD-10-CM | POA: Diagnosis not present

## 2012-02-09 DIAGNOSIS — Z1211 Encounter for screening for malignant neoplasm of colon: Secondary | ICD-10-CM | POA: Diagnosis not present

## 2012-03-18 DIAGNOSIS — I359 Nonrheumatic aortic valve disorder, unspecified: Secondary | ICD-10-CM | POA: Diagnosis not present

## 2012-03-18 DIAGNOSIS — Z954 Presence of other heart-valve replacement: Secondary | ICD-10-CM | POA: Diagnosis not present

## 2012-03-18 DIAGNOSIS — E782 Mixed hyperlipidemia: Secondary | ICD-10-CM | POA: Diagnosis not present

## 2012-08-09 DIAGNOSIS — E782 Mixed hyperlipidemia: Secondary | ICD-10-CM | POA: Diagnosis not present

## 2012-08-09 DIAGNOSIS — E039 Hypothyroidism, unspecified: Secondary | ICD-10-CM | POA: Diagnosis not present

## 2012-08-09 DIAGNOSIS — Z23 Encounter for immunization: Secondary | ICD-10-CM | POA: Diagnosis not present

## 2012-08-09 DIAGNOSIS — J309 Allergic rhinitis, unspecified: Secondary | ICD-10-CM | POA: Diagnosis not present

## 2012-09-23 DIAGNOSIS — Z954 Presence of other heart-valve replacement: Secondary | ICD-10-CM | POA: Diagnosis not present

## 2012-09-23 DIAGNOSIS — I359 Nonrheumatic aortic valve disorder, unspecified: Secondary | ICD-10-CM | POA: Diagnosis not present

## 2012-09-23 DIAGNOSIS — I959 Hypotension, unspecified: Secondary | ICD-10-CM | POA: Diagnosis not present

## 2012-09-23 DIAGNOSIS — E782 Mixed hyperlipidemia: Secondary | ICD-10-CM | POA: Diagnosis not present

## 2012-10-31 DIAGNOSIS — Z85828 Personal history of other malignant neoplasm of skin: Secondary | ICD-10-CM | POA: Diagnosis not present

## 2012-10-31 DIAGNOSIS — L821 Other seborrheic keratosis: Secondary | ICD-10-CM | POA: Diagnosis not present

## 2012-10-31 DIAGNOSIS — L570 Actinic keratosis: Secondary | ICD-10-CM | POA: Diagnosis not present

## 2013-02-09 DIAGNOSIS — Z1331 Encounter for screening for depression: Secondary | ICD-10-CM | POA: Diagnosis not present

## 2013-02-09 DIAGNOSIS — E782 Mixed hyperlipidemia: Secondary | ICD-10-CM | POA: Diagnosis not present

## 2013-02-09 DIAGNOSIS — E039 Hypothyroidism, unspecified: Secondary | ICD-10-CM | POA: Diagnosis not present

## 2013-02-09 DIAGNOSIS — Z0000 Encounter for general adult medical examination without abnormal findings: Secondary | ICD-10-CM | POA: Diagnosis not present

## 2013-02-09 DIAGNOSIS — Z954 Presence of other heart-valve replacement: Secondary | ICD-10-CM | POA: Diagnosis not present

## 2013-02-09 DIAGNOSIS — N182 Chronic kidney disease, stage 2 (mild): Secondary | ICD-10-CM | POA: Diagnosis not present

## 2013-02-09 DIAGNOSIS — Z23 Encounter for immunization: Secondary | ICD-10-CM | POA: Diagnosis not present

## 2013-02-09 DIAGNOSIS — R7309 Other abnormal glucose: Secondary | ICD-10-CM | POA: Diagnosis not present

## 2013-02-09 DIAGNOSIS — I359 Nonrheumatic aortic valve disorder, unspecified: Secondary | ICD-10-CM | POA: Diagnosis not present

## 2013-03-26 ENCOUNTER — Encounter: Payer: Self-pay | Admitting: Cardiology

## 2013-03-26 DIAGNOSIS — I4510 Unspecified right bundle-branch block: Secondary | ICD-10-CM | POA: Insufficient documentation

## 2013-03-26 DIAGNOSIS — E039 Hypothyroidism, unspecified: Secondary | ICD-10-CM | POA: Insufficient documentation

## 2013-03-26 DIAGNOSIS — E785 Hyperlipidemia, unspecified: Secondary | ICD-10-CM | POA: Insufficient documentation

## 2013-04-03 ENCOUNTER — Ambulatory Visit (INDEPENDENT_AMBULATORY_CARE_PROVIDER_SITE_OTHER): Payer: Medicare Other | Admitting: Cardiology

## 2013-04-03 ENCOUNTER — Encounter: Payer: Self-pay | Admitting: Cardiology

## 2013-04-03 ENCOUNTER — Encounter (INDEPENDENT_AMBULATORY_CARE_PROVIDER_SITE_OTHER): Payer: Self-pay

## 2013-04-03 VITALS — BP 136/84 | HR 56 | Ht 70.0 in | Wt 172.0 lb

## 2013-04-03 DIAGNOSIS — Z952 Presence of prosthetic heart valve: Secondary | ICD-10-CM

## 2013-04-03 DIAGNOSIS — I951 Orthostatic hypotension: Secondary | ICD-10-CM | POA: Diagnosis not present

## 2013-04-03 DIAGNOSIS — E785 Hyperlipidemia, unspecified: Secondary | ICD-10-CM

## 2013-04-03 DIAGNOSIS — Z954 Presence of other heart-valve replacement: Secondary | ICD-10-CM

## 2013-04-03 DIAGNOSIS — I4510 Unspecified right bundle-branch block: Secondary | ICD-10-CM

## 2013-04-03 NOTE — Patient Instructions (Signed)
Your physician wants you to follow-up in: 6 months with Dr. Skains You will receive a reminder letter in the mail two months in advance. If you don't receive a letter, please call our office to schedule the follow-up appointment.  Your physician recommends that you continue on your current medications as directed. Please refer to the Current Medication list given to you today.  

## 2013-04-03 NOTE — Progress Notes (Signed)
[name]. [address], [address] [place], [place]  [address] Phone: [phone] Fax:  [phone]  Date:  04/03/2013   ID:  Jay [name], DOB [birth year], MRN [id number]  PCP:  Wenda Low, MD   History of Present Illness: Jay [name] is a 80 y.o. male with aortic valve replacement on 10/31/09 with a pericardial #23 Edwards.  Energy can be low at times but not terrible. Children live behind him. Walked half mile. In all, he feels very vigorous. He chops wood. He is very satisfied with his life.  Has arthritis at times. Used to be on Celebrex.  BP has been low at times, whoozy. Tries to eat more salt. Once he got up and hit the floor long time ago. None since  Wife had MI summer 2013. Dr. Burt Knack took care of her.   Overall doing well  Wt Readings from Last 3 Encounters:  04/03/13 172 lb (78.019 kg)     Past Medical History  Diagnosis Date  . Hypothyroidism   . Dyslipidemia   . OA (optic atrophy)   . Insomnia   . Allergic rhinitis     ,RAD wheezing  . Diverticulosis     , ACBE, flex, in 2003  . Pelvic fracture     , Hx  . Fracture of left clavicle     , Hx  . Hearing loss     , Bilateral  . Kidney stone   . Osteoarthritis   . RBBB (right bundle branch block)     , seen on EKG  . Cataracts, bilateral   . Varicose veins     Past Surgical History  Procedure Laterality Date  . Inguinal hernia repair Bilateral   . Orchectomy Right   . Thyroidectomy    . Anomalous pulmonary venous return repair      Current Outpatient Prescriptions  Medication Sig Dispense Refill  . albuterol (PROVENTIL HFA;VENTOLIN HFA) 108 (90 BASE) MCG/ACT inhaler Inhale 2 puffs into the lungs every 6 (six) hours as needed for wheezing or shortness of breath.      [name] aspirin 81 MG tablet Take 81 mg by mouth daily.      [name] atorvastatin (LIPITOR) 10 MG tablet Take 10 mg by mouth daily.      . cetirizine (ZYRTEC) 10 MG tablet Take 10 mg by mouth daily.      [name] levothyroxine (SYNTHROID,  LEVOTHROID) 88 MCG tablet Take 88 mcg by mouth daily before breakfast.      . mometasone-formoterol (DULERA) 100-5 MCG/ACT AERO Inhale 2 puffs into the lungs 2 (two) times daily.      . Omega-3 300 MG CAPS Take 900 mg by mouth daily.      . saw palmetto 500 MG capsule Take 500 mg by mouth daily.       No current facility-administered medications for this visit.    Allergies:    Allergies  Allergen Reactions  . Aleve [Naproxen Sodium] Hives  . Penicillin G Benzathine     Social History:  The patient  reports that he has quit smoking. He does not have any smokeless tobacco history on file. He reports that he drinks alcohol. He reports that he does not use illicit drugs.   ROS:  Please see the history of present illness.   Denies any CV, bleeding, orthopnea, PND   PHYSICAL EXAM: VS:  BP 136/84  Pulse 56  Ht 5\' 10"  (1.778 m)  Wt 172 lb (78.019 kg)  BMI 24.68 kg/m2 Well nourished, well developed, in no acute distress HEENT: normal Neck: no JVD Cardiac:  normal S1, S2; RRR with occasional irreg. ; no murmur Lungs:  clear to auscultation bilaterally, no wheezing, rhonchi or rales Abd: soft, nontender, no hepatomegaly Ext: no edema Skin: warm and dry Neuro: no focal abnormalities noted  EKG: Sinus bradycardia heart rate 56, right bundle branch block, nonspecific ST-T wave changes, Baelynn Schmuhl sinus arrhythmia. Not atrial fibrillation. No significant change from prior.     ASSESSMENT AND PLAN:  1. Right bundle branch block-chronic, stable, no hilar symptoms such as syncope. 2. Aortic valve replacement-currently doing very well no symptoms. 3. Orthostatic hypotension-currently doing well with salt liberalization. Blood pressure excellent today. 4. Hyperlipidemia-continue with low-dose statin.  Signed, Candee Furbish, MD [facility]  04/03/2013 2:12 PM

## 2013-08-14 DIAGNOSIS — E782 Mixed hyperlipidemia: Secondary | ICD-10-CM | POA: Diagnosis not present

## 2013-08-14 DIAGNOSIS — E039 Hypothyroidism, unspecified: Secondary | ICD-10-CM | POA: Diagnosis not present

## 2013-08-14 DIAGNOSIS — J309 Allergic rhinitis, unspecified: Secondary | ICD-10-CM | POA: Diagnosis not present

## 2013-08-14 DIAGNOSIS — I359 Nonrheumatic aortic valve disorder, unspecified: Secondary | ICD-10-CM | POA: Diagnosis not present

## 2013-08-14 DIAGNOSIS — N182 Chronic kidney disease, stage 2 (mild): Secondary | ICD-10-CM | POA: Diagnosis not present

## 2013-10-03 ENCOUNTER — Encounter: Payer: Self-pay | Admitting: Cardiology

## 2013-10-03 ENCOUNTER — Ambulatory Visit (INDEPENDENT_AMBULATORY_CARE_PROVIDER_SITE_OTHER): Payer: Medicare Other | Admitting: Cardiology

## 2013-10-03 VITALS — BP 118/62 | HR 60 | Ht 70.0 in | Wt 164.0 lb

## 2013-10-03 DIAGNOSIS — E785 Hyperlipidemia, unspecified: Secondary | ICD-10-CM

## 2013-10-03 DIAGNOSIS — E038 Other specified hypothyroidism: Secondary | ICD-10-CM

## 2013-10-03 DIAGNOSIS — Z952 Presence of prosthetic heart valve: Secondary | ICD-10-CM | POA: Insufficient documentation

## 2013-10-03 DIAGNOSIS — Z954 Presence of other heart-valve replacement: Secondary | ICD-10-CM | POA: Diagnosis not present

## 2013-10-03 DIAGNOSIS — I4510 Unspecified right bundle-branch block: Secondary | ICD-10-CM | POA: Diagnosis not present

## 2013-10-03 NOTE — Progress Notes (Signed)
[place]. [address], [address] [place], [place]  [address] Phone: [phone] Fax:  [phone]  Date:  10/03/2013   ID:  Jay [name], DOB 06-13-1925, MRN [id number]  PCP:  Wenda Low, MD   History of Present Illness: Jay [name] is a 80 y.o. male with aortic valve replacement on 10/31/09 with a pericardial #23 Edwards.  Energy can be low at times but not terrible. Children live behind him. Walked half mile. In all, he feels very vigorous. He chops wood. He is very satisfied with his life.  Has arthritis at times. Used to be on Celebrex.  BP has been low at times, whoozy. Tries to eat more salt. Once he got up and hit the floor long time ago. None since  Wife had MI summer 2013. Dr. Burt Knack took care of her.   Overall doing well.  Did have some questions about statin use and potential side effects from this.  Wt Readings from Last 3 Encounters:  10/03/13 164 lb (74.39 kg)  04/03/13 172 lb (78.019 kg)     Past Medical History  Diagnosis Date  . Hypothyroidism   . Dyslipidemia   . OA (optic atrophy)   . Insomnia   . Allergic rhinitis     ,RAD wheezing  . Diverticulosis     , ACBE, flex, in 2003  . Pelvic fracture     , Hx  . Fracture of left clavicle     , Hx  . Hearing loss     , Bilateral  . Kidney stone   . Osteoarthritis   . RBBB (right bundle branch block)     , seen on EKG  . Cataracts, bilateral   . Varicose veins     Past Surgical History  Procedure Laterality Date  . Inguinal hernia repair Bilateral   . Orchectomy Right   . Thyroidectomy    . Anomalous pulmonary venous return repair      Current Outpatient Prescriptions  Medication Sig Dispense Refill  . albuterol (PROVENTIL HFA;VENTOLIN HFA) 108 (90 BASE) MCG/ACT inhaler Inhale 2 puffs into the lungs every 6 (six) hours as needed for wheezing or shortness of breath.      [name] aspirin 81 MG tablet Take 81 mg by mouth daily.      [name] atorvastatin (LIPITOR) 10 MG tablet Take 10 mg by mouth  daily.      . cetirizine (ZYRTEC) 10 MG tablet Take 10 mg by mouth daily.      . clindamycin (CLEOCIN) 150 MG capsule For dental visits      . levothyroxine (SYNTHROID, LEVOTHROID) 88 MCG tablet Take 88 mcg by mouth daily before breakfast.      . mometasone-formoterol (DULERA) 100-5 MCG/ACT AERO Inhale 2 puffs into the lungs 2 (two) times daily.      . Omega-3 300 MG CAPS Take 900 mg by mouth daily.      . saw palmetto 500 MG capsule Take 500 mg by mouth daily.       No current facility-administered medications for this visit.    Allergies:    Allergies  Allergen Reactions  . Aleve [Naproxen Sodium] Hives  . Penicillin G Benzathine     Social History:  The patient  reports that he has quit smoking. He does not have any smokeless tobacco history on file. He reports that he drinks alcohol. He reports that he does not use illicit drugs.   ROS:  Please see the history  of present illness.   Denies any CV, bleeding, orthopnea, PND   PHYSICAL EXAM: VS:  BP 118/62  Pulse 60  Ht 5\' 10"  (1.778 m)  Wt 164 lb (74.39 kg)  BMI 23.53 kg/m2 Well nourished, well developed, in no acute distress HEENT: normal Neck: no JVD Cardiac:  normal S1, S2; RRR; no murmur Lungs:  clear to auscultation bilaterally, no wheezing, rhonchi or rales Abd: soft, nontender, no hepatomegaly Ext: no edema Skin: warm and dry Neuro: no focal abnormalities noted  EKG: 1/15: Sinus bradycardia heart rate 56, right bundle branch block, nonspecific ST-T wave changes, Trinidad Petron sinus arrhythmia. Not atrial fibrillation. No significant change from prior.  Labs: 11/14-LDL 86     ASSESSMENT AND PLAN:  1. Right bundle branch block-chronic, stable, no high risk symptoms such as syncope. 2. Aortic valve replacement-currently doing very well no symptoms. No murmurs 3. Orthostatic hypotension-currently doing well with salt liberalization. Blood pressure excellent today. 4. Hyperlipidemia-we will give him a statin holiday for 2  weeks. See how his symptoms are. He had a neighbor who felt much better off of his cholesterol medication. After 2 weeks, reevaluate, resume low-dose atorvastatin. If symptoms return, we may go to a 3 times a week plan.  Signed, Candee Furbish, MD [facility]  10/03/2013 8:57 AM

## 2013-10-03 NOTE — Patient Instructions (Signed)
Hold atorvastatin for 2 weeks to see how you feel.  IF no improvement after 2 weeks restart.  Continue all other medications as listed.  Follow up in 1 year with Dr Marlou Porch.  You will receive a letter in the mail 2 months before you are due.  Please call us when you receive this letter to schedule your follow up appointment.

## 2014-02-14 DIAGNOSIS — M1990 Unspecified osteoarthritis, unspecified site: Secondary | ICD-10-CM | POA: Diagnosis not present

## 2014-02-14 DIAGNOSIS — E039 Hypothyroidism, unspecified: Secondary | ICD-10-CM | POA: Diagnosis not present

## 2014-02-14 DIAGNOSIS — Z1389 Encounter for screening for other disorder: Secondary | ICD-10-CM | POA: Diagnosis not present

## 2014-02-14 DIAGNOSIS — Z0000 Encounter for general adult medical examination without abnormal findings: Secondary | ICD-10-CM | POA: Diagnosis not present

## 2014-02-14 DIAGNOSIS — N182 Chronic kidney disease, stage 2 (mild): Secondary | ICD-10-CM | POA: Diagnosis not present

## 2014-02-14 DIAGNOSIS — J309 Allergic rhinitis, unspecified: Secondary | ICD-10-CM | POA: Diagnosis not present

## 2014-02-14 DIAGNOSIS — Z952 Presence of prosthetic heart valve: Secondary | ICD-10-CM | POA: Diagnosis not present

## 2014-02-14 DIAGNOSIS — Z23 Encounter for immunization: Secondary | ICD-10-CM | POA: Diagnosis not present

## 2014-02-14 DIAGNOSIS — R7309 Other abnormal glucose: Secondary | ICD-10-CM | POA: Diagnosis not present

## 2014-02-14 DIAGNOSIS — E780 Pure hypercholesterolemia: Secondary | ICD-10-CM | POA: Diagnosis not present

## 2014-02-26 DIAGNOSIS — Z1211 Encounter for screening for malignant neoplasm of colon: Secondary | ICD-10-CM | POA: Diagnosis not present

## 2014-07-19 DIAGNOSIS — D229 Melanocytic nevi, unspecified: Secondary | ICD-10-CM | POA: Diagnosis not present

## 2014-07-19 DIAGNOSIS — D1801 Hemangioma of skin and subcutaneous tissue: Secondary | ICD-10-CM | POA: Diagnosis not present

## 2014-07-19 DIAGNOSIS — L821 Other seborrheic keratosis: Secondary | ICD-10-CM | POA: Diagnosis not present

## 2014-07-19 DIAGNOSIS — L723 Sebaceous cyst: Secondary | ICD-10-CM | POA: Diagnosis not present

## 2014-07-19 DIAGNOSIS — Z85828 Personal history of other malignant neoplasm of skin: Secondary | ICD-10-CM | POA: Diagnosis not present

## 2014-07-19 DIAGNOSIS — L720 Epidermal cyst: Secondary | ICD-10-CM | POA: Diagnosis not present

## 2014-10-04 ENCOUNTER — Telehealth: Payer: Self-pay | Admitting: *Deleted

## 2014-10-04 NOTE — Telephone Encounter (Signed)
UNABLE TO REACH PT TO GET FAMILY HX/STATUS.

## 2014-10-09 ENCOUNTER — Encounter: Payer: Self-pay | Admitting: Cardiology

## 2014-10-09 ENCOUNTER — Ambulatory Visit (INDEPENDENT_AMBULATORY_CARE_PROVIDER_SITE_OTHER): Payer: Medicare Other | Admitting: Cardiology

## 2014-10-09 VITALS — BP 128/68 | HR 56 | Ht 69.0 in | Wt 166.5 lb

## 2014-10-09 DIAGNOSIS — I4510 Unspecified right bundle-branch block: Secondary | ICD-10-CM | POA: Diagnosis not present

## 2014-10-09 DIAGNOSIS — Z954 Presence of other heart-valve replacement: Secondary | ICD-10-CM

## 2014-10-09 DIAGNOSIS — Z952 Presence of prosthetic heart valve: Secondary | ICD-10-CM

## 2014-10-09 DIAGNOSIS — E785 Hyperlipidemia, unspecified: Secondary | ICD-10-CM

## 2014-10-09 NOTE — Progress Notes (Signed)
Jay [name]. [address], [address] [place], [place]  [address] Phone: [phone] Fax:  [phone]  Date:  10/09/2014   ID:  Jay [name], DOB 01-01-1926, MRN [id number]  PCP:  Jay Low, MD   History of Present Illness: Jay [name] is a 81 y.o. male with aortic valve replacement on 10/31/09 with a pericardial #23 Edwards.  Energy can be [name] at times but not terrible. Children live behind him. Walked half mile. In all, he feels very vigorous. He chops wood. He is very satisfied with his life.  BP has been [name] at times, whoozy. Tries to eat more salt. Once he got up and hit the floor long time ago. None since  Wife had MI summer 2013. Dr. Burt [name] took care of her.    Wt Readings from Last 3 Encounters:  10/09/14 166 lb 8 oz (75.524 kg)  10/03/13 164 lb (74.39 kg)  04/03/13 172 lb (78.019 kg)     Past Medical History  Diagnosis Date  . Hypothyroidism   . Dyslipidemia   . OA (optic atrophy)   . Insomnia   . Allergic rhinitis     ,RAD wheezing  . Diverticulosis     , ACBE, flex, in 2003  . Pelvic fracture     , Hx  . Fracture of left clavicle     , Hx  . Hearing loss     , Bilateral  . Kidney stone   . Osteoarthritis   . RBBB (right bundle branch block)     , seen on EKG  . Cataracts, bilateral   . Varicose veins     Past Surgical History  Procedure Laterality Date  . Inguinal hernia repair Bilateral   . Orchectomy Right   . Thyroidectomy    . Anomalous pulmonary venous return repair      Current Outpatient Prescriptions  Medication Sig Dispense Refill  . albuterol (PROVENTIL HFA;VENTOLIN HFA) 108 (90 BASE) MCG/ACT inhaler Inhale 2 puffs into the lungs every 6 (six) hours as needed for wheezing or shortness of breath.    [name] aspirin 81 MG tablet Take 81 mg by mouth daily.    [name] atorvastatin (LIPITOR) 10 MG tablet Take 10 mg by mouth daily.    . cetirizine (ZYRTEC) 10 MG tablet Take 10 mg by mouth as needed.     . clindamycin (CLEOCIN) 150 MG capsule  For dental visits    . levothyroxine (SYNTHROID, LEVOTHROID) 88 MCG tablet Take 88 mcg by mouth daily before breakfast.    . mometasone-formoterol (DULERA) 100-5 MCG/ACT AERO Inhale 2 puffs into the lungs as needed.     . Omega-3 300 MG CAPS Take 900 mg by mouth daily.    . saw palmetto 500 MG capsule Take 500 mg by mouth daily.     No current facility-administered medications for this visit.    Allergies:    Allergies  Allergen Reactions  . Aleve [Naproxen Sodium] Hives  . Penicillin G Benzathine     Social History:  The patient  reports that he has quit smoking. He does not have any smokeless tobacco history on file. He reports that he drinks alcohol. He reports that he does not use illicit drugs.   ROS:  Please see the history of present illness.   Denies any CV, bleeding, orthopnea, PND   PHYSICAL EXAM: VS:  BP 128/68 mmHg  Pulse 56  Ht 5\' 9"  (1.753 m)  Wt 166 lb 8 oz (75.524  kg)  BMI 24.58 kg/m2 Well nourished, well developed, in no acute distress HEENT: normal Neck: no JVD Cardiac:  normal S1, S2; RRR; no murmur Lungs:  clear to auscultation bilaterally, no wheezing, rhonchi or rales Abd: soft, nontender, no hepatomegaly Ext: no edema Skin: warm and dry Neuro: no focal abnormalities noted  EKG: Today 10/09/14-sinus bradycardia, sinus arrhythmia, 56, likely PACs, right bundle block, left posterior fascicular block personally viewed-no significant change in prior 1/15: Sinus bradycardia heart rate 56, right bundle branch block, nonspecific ST-T wave changes, Jay [name] sinus arrhythmia. Not atrial fibrillation. No significant change from prior.  Labs: 11/14-LDL 86     ASSESSMENT AND PLAN:  1. Right bundle branch block-chronic, stable, no high risk symptoms such as syncope. 2. Aortic valve replacement-currently doing very well no symptoms. No murmurs, dental antibodies 3. Orthostatic hypotension-currently doing well with salt liberalization. Blood pressure excellent today.  Careful especially when mowing his 6 acres. 4. Hyperlipidemia-[name]-dose atorvastatin. Tried holiday but no change, he is back on medication. 5. One-year follow-up  Signed, Jay Furbish, MD [facility]  10/09/2014 11:01 AM

## 2014-10-09 NOTE — Patient Instructions (Signed)
Medication Instructions:  Your physician recommends that you continue on your current medications as directed. Please refer to the Current Medication list given to you today.  Follow-Up: Follow up in 1 year with Dr. Skains.  You will receive a letter in the mail 2 months before you are due.  Please call us when you receive this letter to schedule your follow up appointment.  Thank you for choosing [facility]!!       

## 2015-01-30 DIAGNOSIS — R7309 Other abnormal glucose: Secondary | ICD-10-CM | POA: Diagnosis not present

## 2015-01-30 DIAGNOSIS — Z23 Encounter for immunization: Secondary | ICD-10-CM | POA: Diagnosis not present

## 2015-01-30 DIAGNOSIS — Z952 Presence of prosthetic heart valve: Secondary | ICD-10-CM | POA: Diagnosis not present

## 2015-01-30 DIAGNOSIS — E039 Hypothyroidism, unspecified: Secondary | ICD-10-CM | POA: Diagnosis not present

## 2015-01-30 DIAGNOSIS — N182 Chronic kidney disease, stage 2 (mild): Secondary | ICD-10-CM | POA: Diagnosis not present

## 2015-01-30 DIAGNOSIS — D696 Thrombocytopenia, unspecified: Secondary | ICD-10-CM | POA: Diagnosis not present

## 2015-01-30 DIAGNOSIS — J309 Allergic rhinitis, unspecified: Secondary | ICD-10-CM | POA: Diagnosis not present

## 2015-01-30 DIAGNOSIS — E7800 Pure hypercholesterolemia, unspecified: Secondary | ICD-10-CM | POA: Diagnosis not present

## 2015-01-30 DIAGNOSIS — H9193 Unspecified hearing loss, bilateral: Secondary | ICD-10-CM | POA: Diagnosis not present

## 2015-01-30 DIAGNOSIS — M1990 Unspecified osteoarthritis, unspecified site: Secondary | ICD-10-CM | POA: Diagnosis not present

## 2015-01-30 DIAGNOSIS — Z1389 Encounter for screening for other disorder: Secondary | ICD-10-CM | POA: Diagnosis not present

## 2015-01-30 DIAGNOSIS — Z0000 Encounter for general adult medical examination without abnormal findings: Secondary | ICD-10-CM | POA: Diagnosis not present

## 2015-10-04 ENCOUNTER — Encounter: Payer: Self-pay | Admitting: Cardiology

## 2015-10-25 ENCOUNTER — Encounter: Payer: Self-pay | Admitting: Cardiology

## 2015-10-25 ENCOUNTER — Encounter (INDEPENDENT_AMBULATORY_CARE_PROVIDER_SITE_OTHER): Payer: Self-pay

## 2015-10-25 ENCOUNTER — Ambulatory Visit (INDEPENDENT_AMBULATORY_CARE_PROVIDER_SITE_OTHER): Payer: Medicare Other | Admitting: Cardiology

## 2015-10-25 VITALS — BP 110/66 | HR 61 | Ht 69.0 in | Wt 163.0 lb

## 2015-10-25 DIAGNOSIS — Z952 Presence of prosthetic heart valve: Secondary | ICD-10-CM

## 2015-10-25 DIAGNOSIS — I4510 Unspecified right bundle-branch block: Secondary | ICD-10-CM

## 2015-10-25 DIAGNOSIS — E785 Hyperlipidemia, unspecified: Secondary | ICD-10-CM

## 2015-10-25 DIAGNOSIS — Z954 Presence of other heart-valve replacement: Secondary | ICD-10-CM

## 2015-10-25 NOTE — Progress Notes (Signed)
[place]. [address] [place], [place]  [address] Phone: [phone] Fax:  [phone]  Date:  10/25/2015   ID:  Jay [name], DOB [birth year], MRN [id number]  PCP:  Wenda Low, MD   History of Present Illness: Jay [name] is a 82 y.o. male with aortic valve replacement on 10/31/09 with a pericardial #23 Edwards. right bundle branch block chronic  The LAD had about 30% proximal stenosis with some ectatic portion in the mid  segment.  The left circumflex had some aneurysmal dilatation, but no  stenosis.  The right coronary artery was a small nondominant vessel.  Children live behind him. Walked half mile. In all, he feels very vigorous. He chops wood. He is very satisfied with his life.  Wife had MI summer 2013. Dr. Burt Knack took care of her.   He was a bit tangential during his office visit today, sometimes several different topics. I'm concerned because he still driving and taking care of his wife. No significant chest pain or shortness of breath. No syncope. Other times during his encounter he was very sharp and clear area   Wt Readings from Last 3 Encounters:  10/25/15 163 lb (73.9 kg)  10/09/14 166 lb 8 oz (75.5 kg)  10/03/13 164 lb (74.4 kg)     Past Medical History:  Diagnosis Date  . Allergic rhinitis    ,RAD wheezing  . Cataracts, bilateral   . Diverticulosis    , ACBE, flex, in 2003  . Dyslipidemia   . Fracture of left clavicle    , Hx  . Hearing loss    , Bilateral  . Hypothyroidism   . Insomnia   . Kidney stone   . OA (optic atrophy)   . Osteoarthritis   . Pelvic fracture (HCC)    , Hx  . RBBB (right bundle branch block)    , seen on EKG  . Varicose veins     Past Surgical History:  Procedure Laterality Date  . ANOMALOUS PULMONARY VENOUS RETURN REPAIR    . INGUINAL HERNIA REPAIR Bilateral   . Orchectomy Right   . THYROIDECTOMY      Current Outpatient Prescriptions  Medication Sig Dispense Refill  . albuterol (PROVENTIL  HFA;VENTOLIN HFA) 108 (90 BASE) MCG/ACT inhaler Inhale 2 puffs into the lungs every 6 (six) hours as needed for wheezing or shortness of breath.    [name] aspirin 81 MG tablet Take 81 mg by mouth daily.    [name] atorvastatin (LIPITOR) 10 MG tablet Take 10 mg by mouth daily.    . cetirizine (ZYRTEC) 10 MG tablet Take 10 mg by mouth as needed.     . clindamycin (CLEOCIN) 150 MG capsule For dental visits    . levothyroxine (SYNTHROID, LEVOTHROID) 88 MCG tablet Take 88 mcg by mouth daily before breakfast.    . mometasone-formoterol (DULERA) 100-5 MCG/ACT AERO Inhale 2 puffs into the lungs as needed.     . Omega-3 300 MG CAPS Take 900 mg by mouth daily.    . saw palmetto 500 MG capsule Take 500 mg by mouth daily.     No current facility-administered medications for this visit.     Allergies:    Allergies  Allergen Reactions  . Aleve [Naproxen Sodium] Hives  . Penicillin G Benzathine     Social History:  The patient  reports that he has quit smoking. He has never used smokeless tobacco. He reports that he drinks alcohol. He reports that  he does not use drugs.   ROS:  Please see the history of present illness.   Denies any CV, bleeding, orthopnea, PND   PHYSICAL EXAM: VS:  BP 110/66 (BP Location: Right Arm, Patient Position: Sitting, Cuff Size: Normal)   Pulse 61   Ht 5\' 9"  (1.753 m)   Wt 163 lb (73.9 kg)   SpO2 94%   BMI 24.07 kg/m  Well nourished, well developed, in no acute distress  HEENT: normal  Neck: no JVD  Cardiac:  normal S1, S2; RRR; no murmur  Lungs:  clear to auscultation bilaterally, no wheezing, rhonchi or rales  Abd: soft, nontender, no hepatomegaly  Ext: no edema  Skin: warm and dry  Neuro: no focal abnormalities noted  EKG: Today 10/25/15-sinus rhythm, 61, PA-C, sinus arrhythmia noted, PVC, right bundle branch block personally viewed-prior 10/09/14-sinus bradycardia, sinus arrhythmia, 56, likely PACs, right bundle block, left posterior fascicular block personally viewed-no  significant change in prior 1/15: Sinus bradycardia heart rate 56, right bundle branch block, nonspecific ST-T wave changes, Mark sinus arrhythmia. Not atrial fibrillation. No significant change from prior.  Labs: 11/14-LDL 86     ASSESSMENT AND PLAN:  1. Right bundle branch block-chronic, stable, no high risk symptoms such as syncope. 2. Aortic valve replacement-currently doing very well no symptoms. No murmurs, dental antibodies. His been several years since his last echocardiogram, we will check his aortic valve echocardiogram. 3. Orthostatic hypotension-currently doing well with salt liberalization. Blood pressure excellent today. Careful especially when mowing his 6 acres. 4. Hyperlipidemia-low-dose atorvastatin. Tried holiday but no change, he is back on medication. 5. Memory impairment?-I'm somewhat concerned especially with his driving. Expressed this to him. He is the only one taking care of his wife he states. I want to be very careful. If he begins to become more forgetful he may have to relinquish this privilege. 6. One-year follow-up  Signed, Candee Furbish, MD [facility]  10/25/2015 9:45 AM

## 2015-10-25 NOTE — Patient Instructions (Signed)

## 2015-11-07 ENCOUNTER — Ambulatory Visit (HOSPITAL_COMMUNITY): Payer: Medicare Other | Attending: Cardiology

## 2015-11-07 ENCOUNTER — Other Ambulatory Visit: Payer: Self-pay

## 2015-11-07 DIAGNOSIS — I517 Cardiomegaly: Secondary | ICD-10-CM | POA: Insufficient documentation

## 2015-11-07 DIAGNOSIS — I359 Nonrheumatic aortic valve disorder, unspecified: Secondary | ICD-10-CM | POA: Diagnosis present

## 2015-11-07 DIAGNOSIS — Z953 Presence of xenogenic heart valve: Secondary | ICD-10-CM | POA: Insufficient documentation

## 2015-11-07 DIAGNOSIS — Z952 Presence of prosthetic heart valve: Secondary | ICD-10-CM

## 2015-11-07 DIAGNOSIS — I340 Nonrheumatic mitral (valve) insufficiency: Secondary | ICD-10-CM | POA: Diagnosis not present

## 2015-11-07 DIAGNOSIS — E785 Hyperlipidemia, unspecified: Secondary | ICD-10-CM | POA: Insufficient documentation

## 2015-11-07 DIAGNOSIS — Z954 Presence of other heart-valve replacement: Secondary | ICD-10-CM

## 2016-01-07 DIAGNOSIS — H40003 Preglaucoma, unspecified, bilateral: Secondary | ICD-10-CM | POA: Diagnosis not present

## 2016-02-10 DIAGNOSIS — H2513 Age-related nuclear cataract, bilateral: Secondary | ICD-10-CM | POA: Diagnosis not present

## 2016-02-11 DIAGNOSIS — Z85828 Personal history of other malignant neoplasm of skin: Secondary | ICD-10-CM | POA: Diagnosis not present

## 2016-02-11 DIAGNOSIS — L814 Other melanin hyperpigmentation: Secondary | ICD-10-CM | POA: Diagnosis not present

## 2016-02-11 DIAGNOSIS — L570 Actinic keratosis: Secondary | ICD-10-CM | POA: Diagnosis not present

## 2016-02-11 DIAGNOSIS — L821 Other seborrheic keratosis: Secondary | ICD-10-CM | POA: Diagnosis not present

## 2016-02-12 ENCOUNTER — Encounter: Payer: Self-pay | Admitting: *Deleted

## 2016-02-14 NOTE — Discharge Instructions (Signed)
Cataract Surgery, Care After °Refer to this sheet in the next few weeks. These instructions provide you with information about caring for yourself after your procedure. Your health care provider may also give you more specific instructions. Your treatment has been planned according to current medical practices, but problems sometimes occur. Call your health care provider if you have any problems or questions after your procedure. °What can I expect after the procedure? °After the procedure, it is common to have: °· Itching. °· Discomfort. °· Fluid discharge. °· Sensitivity to light and to touch. °· Bruising. °Follow these instructions at home: °Eye Care  °· Check your eye every day for signs of infection. Watch for: °¨ Redness, swelling, or pain. °¨ Fluid, blood, or pus. °¨ Warmth. °¨ Bad smell. °Activity  °· Avoid strenuous activities, such as playing contact sports, for as long as told by your health care provider. °· Do not drive or operate heavy machinery until your health care provider approves. °· Do not bend or lift heavy objects . Bending increases pressure in the eye. You can walk, climb stairs, and do light household chores. °· Ask your health care provider when you can return to work. If you work in a dusty environment, you may be advised to wear protective eyewear for a period of time. °General instructions  °· Take or apply over-the-counter and prescription medicines only as told by your health care provider. This includes eye drops. °· Do not touch or rub your eyes. °· If you were given a protective shield, wear it as told by your health care provider. If you were not given a protective shield, wear sunglasses as told by your health care provider to protect your eyes. °· Keep the area around your eye clean and dry. Avoid swimming or allowing water to hit you directly in the face while showering until told by your health care provider. Keep soap and shampoo out of your eyes. °· Do not put a contact lens  into the affected eye or eyes until your health care provider approves. °· Keep all follow-up visits as told by your health care provider. This is important. °Contact a health care provider if: ° °· You have increased bruising around your eye. °· You have pain that is not helped with medicine. °· You have a fever. °· You have redness, swelling, or pain in your eye. °· You have fluid, blood, or pus coming from your incision. °· Your vision gets worse. °Get help right away if: °· You have sudden vision loss. °This information is not intended to replace advice given to you by your health care provider. Make sure you discuss any questions you have with your health care provider. °Document Released: 10/03/2004 Document Revised: 07/25/2015 Document Reviewed: 01/24/2015 °Elsevier Interactive Patient Education © 2017 [employer] ° ° ° ° °General Anesthesia, Adult, Care After °These instructions provide you with information about caring for yourself after your procedure. Your health care provider may also give you more specific instructions. Your treatment has been planned according to current medical practices, but problems sometimes occur. Call your health care provider if you have any problems or questions after your procedure. °What can I expect after the procedure? °After the procedure, it is common to have: °· Vomiting. °· A sore throat. °· Mental slowness. °It is common to feel: °· Nauseous. °· Cold or shivery. °· Sleepy. °· Tired. °· Sore or achy, even in parts of your body where you did not have surgery. °Follow these instructions at   home: °For at least 24 hours after the procedure:  °· Do not: °¨ Participate in activities where you could fall or become injured. °¨ Drive. °¨ Use heavy machinery. °¨ Drink alcohol. °¨ Take sleeping pills or medicines that cause drowsiness. °¨ Make important decisions or sign legal documents. °¨ Take care of children on your own. °· Rest. °Eating and drinking  °· If you vomit, drink  water, juice, or soup when you can drink without vomiting. °· Drink enough fluid to keep your urine clear or pale yellow. °· Make sure you have little or no nausea before eating solid foods. °· Follow the diet recommended by your health care provider. °General instructions  °· Have a responsible adult stay with you until you are awake and alert. °· Return to your normal activities as told by your health care provider. Ask your health care provider what activities are safe for you. °· Take over-the-counter and prescription medicines only as told by your health care provider. °· If you smoke, do not smoke without supervision. °· Keep all follow-up visits as told by your health care provider. This is important. °Contact a health care provider if: °· You continue to have nausea or vomiting at home, and medicines are not helpful. °· You cannot drink fluids or start eating again. °· You cannot urinate after 8-12 hours. °· You develop a skin rash. °· You have fever. °· You have increasing redness at the site of your procedure. °Get help right away if: °· You have difficulty breathing. °· You have chest pain. °· You have unexpected bleeding. °· You feel that you are having a life-threatening or urgent problem. °This information is not intended to replace advice given to you by your health care provider. Make sure you discuss any questions you have with your health care provider. °Document Released: 06/22/2000 Document Revised: 08/19/2015 Document Reviewed: 02/28/2015 °Elsevier Interactive Patient Education © 2017 [employer] Inc[employer] ° °

## 2016-02-17 ENCOUNTER — Ambulatory Visit: Payer: Medicare Other | Admitting: Anesthesiology

## 2016-02-17 ENCOUNTER — Ambulatory Visit
Admission: RE | Admit: 2016-02-17 | Discharge: 2016-02-17 | Disposition: A | Discharge status: Home / Self Care | Payer: Medicare Other | Source: Ambulatory Visit | Attending: Ophthalmology | Admitting: Ophthalmology

## 2016-02-17 ENCOUNTER — Encounter
Admission: RE | Disposition: A | Discharge status: Home / Self Care | Payer: Self-pay | Source: Ambulatory Visit | Attending: Ophthalmology

## 2016-02-17 DIAGNOSIS — E7800 Pure hypercholesterolemia, unspecified: Secondary | ICD-10-CM | POA: Insufficient documentation

## 2016-02-17 DIAGNOSIS — I4510 Unspecified right bundle-branch block: Secondary | ICD-10-CM | POA: Diagnosis not present

## 2016-02-17 DIAGNOSIS — Z87891 Personal history of nicotine dependence: Secondary | ICD-10-CM | POA: Insufficient documentation

## 2016-02-17 DIAGNOSIS — J45909 Unspecified asthma, uncomplicated: Secondary | ICD-10-CM | POA: Insufficient documentation

## 2016-02-17 DIAGNOSIS — E039 Hypothyroidism, unspecified: Secondary | ICD-10-CM | POA: Diagnosis not present

## 2016-02-17 DIAGNOSIS — N400 Enlarged prostate without lower urinary tract symptoms: Secondary | ICD-10-CM | POA: Insufficient documentation

## 2016-02-17 DIAGNOSIS — H2511 Age-related nuclear cataract, right eye: Secondary | ICD-10-CM | POA: Insufficient documentation

## 2016-02-17 DIAGNOSIS — H2513 Age-related nuclear cataract, bilateral: Secondary | ICD-10-CM | POA: Diagnosis not present

## 2016-02-17 DIAGNOSIS — Z954 Presence of other heart-valve replacement: Secondary | ICD-10-CM | POA: Insufficient documentation

## 2016-02-17 HISTORY — PX: CATARACT EXTRACTION W/PHACO: SHX586

## 2016-02-17 SURGERY — PHACOEMULSIFICATION, CATARACT, WITH IOL INSERTION
Anesthesia: Monitor Anesthesia Care | Laterality: Right

## 2016-02-17 MED ORDER — ACETAMINOPHEN 160 MG/5ML PO SOLN: 325.0000 mg | ORAL | Status: DC | PRN

## 2016-02-17 MED ORDER — EPINEPHRINE PF 1 MG/ML IJ SOLN
INTRAOCULAR | Status: DC | PRN
  Administered 2016-02-17: 120 mL via OPHTHALMIC

## 2016-02-17 MED ORDER — MOXIFLOXACIN HCL 0.5 % OP SOLN
1.0000 [drp] | OPHTHALMIC | Status: DC | PRN
  Administered 2016-02-17 (×3): 1 [drp] via OPHTHALMIC

## 2016-02-17 MED ORDER — MIDAZOLAM HCL 2 MG/2ML IJ SOLN
INTRAMUSCULAR | Status: DC | PRN
  Administered 2016-02-17: 1 mg via INTRAVENOUS

## 2016-02-17 MED ORDER — FENTANYL CITRATE (PF) 100 MCG/2ML IJ SOLN
INTRAMUSCULAR | Status: DC | PRN
  Administered 2016-02-17: 50 ug via INTRAVENOUS

## 2016-02-17 MED ORDER — ARMC OPHTHALMIC DILATING DROPS
1.0000 "application " | OPHTHALMIC | Status: DC | PRN
  Administered 2016-02-17 (×3): 1 via OPHTHALMIC

## 2016-02-17 MED ORDER — BRIMONIDINE TARTRATE 0.2 % OP SOLN
OPHTHALMIC | Status: DC | PRN
  Administered 2016-02-17: 1 [drp] via OPHTHALMIC

## 2016-02-17 MED ORDER — LIDOCAINE HCL (PF) 4 % IJ SOLN
INTRAOCULAR | Status: DC | PRN
  Administered 2016-02-17: 1 mL via OPHTHALMIC

## 2016-02-17 MED ORDER — MOXIFLOXACIN HCL 0.5 % OP SOLN
OPHTHALMIC | Status: DC | PRN
  Administered 2016-02-17: .3 mL via OPHTHALMIC

## 2016-02-17 MED ORDER — ACETAMINOPHEN 325 MG PO TABS: 325.0000 mg | ORAL_TABLET | ORAL | Status: DC | PRN

## 2016-02-17 MED ORDER — TIMOLOL MALEATE 0.5 % OP SOLN
OPHTHALMIC | Status: DC | PRN
  Administered 2016-02-17: 1 [drp] via OPHTHALMIC

## 2016-02-17 MED ORDER — NA HYALUR & NA CHOND-NA HYALUR 0.4-0.35 ML IO KIT
PACK | INTRAOCULAR | Status: DC | PRN
  Administered 2016-02-17: 1 mL via INTRAOCULAR

## 2016-02-17 MED ORDER — LACTATED RINGERS IV SOLN: INTRAVENOUS | Status: DC

## 2016-02-17 SURGICAL SUPPLY — 31 items
APL FBRTP 3 NS LF CTTN WD (MISCELLANEOUS) ×1
APPLICATOR COTTON TIP 3IN (MISCELLANEOUS) ×2 IMPLANT
CANNULA ANT/CHMB 27G (MISCELLANEOUS) ×1 IMPLANT
CANNULA ANT/CHMB 27GA (MISCELLANEOUS) ×2 IMPLANT
DISSECTOR HYDRO NUCLEUS 50X22 (MISCELLANEOUS) ×2 IMPLANT
GLOVE BIO SURGEON STRL SZ7 (GLOVE) ×2 IMPLANT
GLOVE SURG LX 6.5 MICRO (GLOVE) ×1
GLOVE SURG LX STRL 6.5 MICRO (GLOVE) ×1 IMPLANT
GOWN STRL REUS W/ TWL LRG LVL3 (GOWN DISPOSABLE) ×2 IMPLANT
GOWN STRL REUS W/TWL LRG LVL3 (GOWN DISPOSABLE) ×4
LENS IOL ACRYSOF IQ 21.0 (Intraocular Lens) ×1 IMPLANT
MARKER SKIN DUAL TIP RULER LAB (MISCELLANEOUS) ×2 IMPLANT
NDL FILTER BLUNT 18X1 1/2 (NEEDLE) ×1 IMPLANT
NEEDLE FILTER BLUNT 18X 1/2SAF (NEEDLE) ×1
NEEDLE FILTER BLUNT 18X1 1/2 (NEEDLE) ×1 IMPLANT
PACK CATARACT BRASINGTON (MISCELLANEOUS) ×2 IMPLANT
PACK EYE AFTER SURG (MISCELLANEOUS) ×2 IMPLANT
PACK OPTHALMIC (MISCELLANEOUS) ×2 IMPLANT
RING MALYGIN 7.0 (MISCELLANEOUS) IMPLANT
SOL BAL SALT 15ML (MISCELLANEOUS)
SOLUTION BAL SALT 15ML (MISCELLANEOUS) IMPLANT
SUT ETHILON 10-0 CS-B-6CS-B-6 (SUTURE)
SUT VICRYL  9 0 (SUTURE)
SUT VICRYL 9 0 (SUTURE) IMPLANT
SUTURE EHLN 10-0 CS-B-6CS-B-6 (SUTURE) IMPLANT
SYR 3ML LL SCALE MARK (SYRINGE) ×2 IMPLANT
SYR TB 1ML LUER SLIP (SYRINGE) ×2 IMPLANT
WATER STERILE IRR 250ML POUR (IV SOLUTION) ×2 IMPLANT
WATER STERILE IRR 500ML POUR (IV SOLUTION) IMPLANT
WICK EYE OCUCEL (MISCELLANEOUS) IMPLANT
WIPE NON LINTING 3.25X3.25 (MISCELLANEOUS) ×2 IMPLANT

## 2016-02-17 NOTE — Anesthesia Postprocedure Evaluation (Signed)
Anesthesia Post Note  Patient: Jay [name]  Procedure(s) Performed: Procedure(s) (LRB): CATARACT EXTRACTION PHACO AND INTRAOCULAR LENS PLACEMENT (IOC) (Right)  Patient location during evaluation: PACU Anesthesia Type: MAC Level of consciousness: awake and alert Pain management: pain level controlled Vital Signs Assessment: post-procedure vital signs reviewed and stable Respiratory status: spontaneous breathing, nonlabored ventilation, respiratory function stable and patient connected to nasal cannula oxygen Cardiovascular status: stable and blood pressure returned to baseline Anesthetic complications: no    Trecia Rogers

## 2016-02-17 NOTE — Anesthesia Procedure Notes (Signed)
Procedure Name: MAC Performed by: Danell Verno Pre-anesthesia Checklist: Patient identified, Emergency Drugs available, Suction available, Timeout performed and Patient being monitored Patient Re-evaluated:Patient Re-evaluated prior to inductionOxygen Delivery Method: Nasal cannula Placement Confirmation: positive ETCO2       

## 2016-02-17 NOTE — H&P (Signed)
H+P reviewed and is up to date, please see paper chart.  

## 2016-02-17 NOTE — Op Note (Signed)
Date of Surgery: 02/17/2016  PREOPERATIVE DIAGNOSES: Visually significant nuclear sclerotic cataract, right eye.  POSTOPERATIVE DIAGNOSES: Same  PROCEDURES PERFORMED: Cataract extraction with intraocular lens implant, right eye.  SURGEON: Almon Hercules, M.D.  ANESTHESIA: MAC and topical  IMPLANTS: [id number] +21.0 D  Implant Name Type Inv. Item Serial No. Manufacturer Lot No. LRB No. Used  LENS IOL ACRYSOF IQ 21.0 - [id number] Intraocular Lens LENS IOL ACRYSOF IQ 21.0 [id number] [employer]CON   Right 1     COMPLICATIONS: None.  DESCRIPTION OF PROCEDURE: Therapeutic options were discussed with the patient preoperatively, including a discussion of risks and benefits of surgery. Informed consent was obtained. An IOL-Master and immersion biometry were used to take the lens measurements, and a dilated fundus exam was performed within 6 months of the surgical date.  The patient was premedicated and brought to the operating room and placed on the operating table in the supine position. After adequate anesthesia, the patient was prepped and draped in the usual sterile ophthalmic fashion. A wire lid speculum was inserted and the microscope was positioned. A Superblade was used to create a paracentesis site at the limbus and a small amount of dilute preservative free lidocaine was instilled into the anterior chamber, followed by dispersive viscoelastic. A clear corneal incision was created temporally using a 2.4 mm keratome blade. Capsulorrhexis was then performed. In situ phacoemulsification was performed.  Cortical material was removed with the irrigation-aspiration unit. Dispersive viscoelastic was instilled to open the capsular bag. A posterior chamber intraocular lens with the specifications above was inserted and positioned. Irrigation-aspiration was used to remove all viscoelastic. Vigamox 1cc was instilled into the anterior chamber, and the corneal incision was checked and found to be water tight. The  eyelid speculum was removed.  The operative eye was covered with protective goggles after instilling 1 drop of timolol and brimonidine. The patient tolerated the procedure well. There were no complications.

## 2016-02-17 NOTE — Anesthesia Preprocedure Evaluation (Signed)
Anesthesia Evaluation  Patient identified by MRN, date of birth, ID band Patient awake    Reviewed: Allergy & Precautions, H&P , NPO status , Patient's Chart, lab work & pertinent test results, reviewed documented beta blocker date and time   Airway Mallampati: II  TM Distance: >3 FB Neck ROM: full    Dental   Few missing teeth, top right:   Pulmonary asthma , former smoker,    Pulmonary exam normal breath sounds clear to auscultation       Cardiovascular Exercise Tolerance: Good negative cardio ROS   Rhythm:regular Rate:Normal  Hx of RBBB   Neuro/Psych negative neurological ROS  negative psych ROS   GI/Hepatic negative GI ROS, Neg liver ROS,   Endo/Other  Hypothyroidism   Renal/GU negative Renal ROS  negative genitourinary   Musculoskeletal   Abdominal   Peds  Hematology negative hematology ROS (+)   Anesthesia Other Findings   Reproductive/Obstetrics negative OB ROS                             Anesthesia Physical Anesthesia Plan  ASA: II  Anesthesia Plan: MAC   Post-op Pain Management:    Induction:   Airway Management Planned:   Additional Equipment:   Intra-op Plan:   Post-operative Plan:   Informed Consent: I have reviewed the patients History and Physical, chart, labs and discussed the procedure including the risks, benefits and alternatives for the proposed anesthesia with the patient or authorized representative who has indicated his/her understanding and acceptance.   Dental Advisory Given  Plan Discussed with: CRNA and Anesthesiologist  Anesthesia Plan Comments:         Anesthesia Quick Evaluation

## 2016-02-17 NOTE — Transfer of Care (Signed)
Immediate Anesthesia Transfer of Care Note  Patient: Jay [name]  Procedure(s) Performed: Procedure(s) with comments: CATARACT EXTRACTION PHACO AND INTRAOCULAR LENS PLACEMENT (IOC) (Right) - RIGHT  Patient Location: PACU  Anesthesia Type: MAC  Level of Consciousness: awake, alert  and patient cooperative  Airway and Oxygen Therapy: Patient Spontanous Breathing and Patient connected to supplemental oxygen  Post-op Assessment: Post-op Vital signs reviewed, Patient's Cardiovascular Status Stable, Respiratory Function Stable, Patent Airway and No signs of Nausea or vomiting  Post-op Vital Signs: Reviewed and stable  Complications: No apparent anesthesia complications

## 2016-02-18 ENCOUNTER — Encounter: Payer: Self-pay | Admitting: Ophthalmology

## 2016-03-09 DIAGNOSIS — N182 Chronic kidney disease, stage 2 (mild): Secondary | ICD-10-CM | POA: Diagnosis not present

## 2016-03-09 DIAGNOSIS — Z23 Encounter for immunization: Secondary | ICD-10-CM | POA: Diagnosis not present

## 2016-03-09 DIAGNOSIS — Z952 Presence of prosthetic heart valve: Secondary | ICD-10-CM | POA: Diagnosis not present

## 2016-03-09 DIAGNOSIS — Z1389 Encounter for screening for other disorder: Secondary | ICD-10-CM | POA: Diagnosis not present

## 2016-03-09 DIAGNOSIS — E7800 Pure hypercholesterolemia, unspecified: Secondary | ICD-10-CM | POA: Diagnosis not present

## 2016-03-09 DIAGNOSIS — R7303 Prediabetes: Secondary | ICD-10-CM | POA: Diagnosis not present

## 2016-03-09 DIAGNOSIS — Z0000 Encounter for general adult medical examination without abnormal findings: Secondary | ICD-10-CM | POA: Diagnosis not present

## 2016-03-09 DIAGNOSIS — D696 Thrombocytopenia, unspecified: Secondary | ICD-10-CM | POA: Diagnosis not present

## 2016-03-09 DIAGNOSIS — H9193 Unspecified hearing loss, bilateral: Secondary | ICD-10-CM | POA: Diagnosis not present

## 2016-03-09 DIAGNOSIS — E039 Hypothyroidism, unspecified: Secondary | ICD-10-CM | POA: Diagnosis not present

## 2016-03-30 ENCOUNTER — Observation Stay (HOSPITAL_COMMUNITY)
Admission: EM | Admit: 2016-03-30 | Discharge: 2016-03-31 | Disposition: A | Discharge status: Home / Self Care | Payer: Medicare Other | Attending: Emergency Medicine | Admitting: Emergency Medicine

## 2016-03-30 ENCOUNTER — Emergency Department (HOSPITAL_COMMUNITY): Payer: Medicare Other

## 2016-03-30 ENCOUNTER — Observation Stay (HOSPITAL_BASED_OUTPATIENT_CLINIC_OR_DEPARTMENT_OTHER): Payer: Medicare Other

## 2016-03-30 ENCOUNTER — Encounter (HOSPITAL_COMMUNITY): Payer: Self-pay

## 2016-03-30 DIAGNOSIS — R2689 Other abnormalities of gait and mobility: Secondary | ICD-10-CM | POA: Insufficient documentation

## 2016-03-30 DIAGNOSIS — R079 Chest pain, unspecified: Principal | ICD-10-CM

## 2016-03-30 DIAGNOSIS — Z79899 Other long term (current) drug therapy: Secondary | ICD-10-CM | POA: Insufficient documentation

## 2016-03-30 DIAGNOSIS — Z87891 Personal history of nicotine dependence: Secondary | ICD-10-CM | POA: Insufficient documentation

## 2016-03-30 DIAGNOSIS — N179 Acute kidney failure, unspecified: Secondary | ICD-10-CM | POA: Diagnosis not present

## 2016-03-30 DIAGNOSIS — I4510 Unspecified right bundle-branch block: Secondary | ICD-10-CM | POA: Diagnosis present

## 2016-03-30 DIAGNOSIS — E039 Hypothyroidism, unspecified: Secondary | ICD-10-CM

## 2016-03-30 DIAGNOSIS — R42 Dizziness and giddiness: Secondary | ICD-10-CM

## 2016-03-30 DIAGNOSIS — R0789 Other chest pain: Secondary | ICD-10-CM | POA: Diagnosis not present

## 2016-03-30 DIAGNOSIS — R0600 Dyspnea, unspecified: Secondary | ICD-10-CM | POA: Diagnosis not present

## 2016-03-30 DIAGNOSIS — I2510 Atherosclerotic heart disease of native coronary artery without angina pectoris: Secondary | ICD-10-CM | POA: Diagnosis not present

## 2016-03-30 DIAGNOSIS — I10 Essential (primary) hypertension: Secondary | ICD-10-CM

## 2016-03-30 DIAGNOSIS — R0602 Shortness of breath: Secondary | ICD-10-CM | POA: Diagnosis not present

## 2016-03-30 DIAGNOSIS — Z7982 Long term (current) use of aspirin: Secondary | ICD-10-CM | POA: Insufficient documentation

## 2016-03-30 DIAGNOSIS — E785 Hyperlipidemia, unspecified: Secondary | ICD-10-CM

## 2016-03-30 DIAGNOSIS — Z952 Presence of prosthetic heart valve: Secondary | ICD-10-CM

## 2016-03-30 LAB — CBC
HCT: 38.3 % — ABNORMAL LOW (ref 39.0–52.0)
Hemoglobin: 12.7 g/dL — ABNORMAL LOW (ref 13.0–17.0)
MCH: 28.3 pg (ref 26.0–34.0)
MCHC: 33.2 g/dL (ref 30.0–36.0)
MCV: 85.3 fL (ref 78.0–100.0)
Platelets: 139 10*3/uL — ABNORMAL LOW (ref 150–400)
RBC: 4.49 MIL/uL (ref 4.22–5.81)
RDW: 13.8 % (ref 11.5–15.5)
WBC: 6.5 10*3/uL (ref 4.0–10.5)

## 2016-03-30 LAB — LIPID PANEL
Cholesterol: 135 mg/dL (ref 0–200)
HDL: 38 mg/dL — ABNORMAL LOW (ref >40)
LDL Cholesterol: 83 mg/dL (ref 0–99)
Total CHOL/HDL Ratio: 3.6 RATIO
Triglycerides: 68 mg/dL (ref <150)
VLDL: 14 mg/dL (ref 0–40)

## 2016-03-30 LAB — URINALYSIS, ROUTINE W REFLEX MICROSCOPIC
Bacteria, UA: NONE SEEN
Bilirubin Urine: NEGATIVE
Glucose, UA: NEGATIVE mg/dL
Ketones, ur: NEGATIVE mg/dL
Leukocytes, UA: NEGATIVE
Nitrite: NEGATIVE
Protein, ur: NEGATIVE mg/dL
Specific Gravity, Urine: 1.01 (ref 1.005–1.030)
pH: 7 (ref 5.0–8.0)

## 2016-03-30 LAB — ECHOCARDIOGRAM COMPLETE
Height: 70 in
Weight: 2633.6 oz

## 2016-03-30 LAB — BASIC METABOLIC PANEL
Anion gap: 9 (ref 5–15)
BUN: 15 mg/dL (ref 6–20)
CO2: 25 mmol/L (ref 22–32)
Calcium: 8.7 mg/dL — ABNORMAL LOW (ref 8.9–10.3)
Chloride: 104 mmol/L (ref 101–111)
Creatinine, Ser: 1.21 mg/dL (ref 0.61–1.24)
GFR calc Af Amer: 60 mL/min — ABNORMAL LOW (ref >60)
GFR calc non Af Amer: 52 mL/min — ABNORMAL LOW (ref >60)
Glucose, Bld: 118 mg/dL — ABNORMAL HIGH (ref 65–99)
Potassium: 3.9 mmol/L (ref 3.5–5.1)
Sodium: 138 mmol/L (ref 135–145)

## 2016-03-30 LAB — TROPONIN I
Troponin I: <0.03 ng/mL (ref <0.03)
Troponin I: <0.03 ng/mL (ref <0.03)
Troponin I: <0.03 ng/mL (ref <0.03)

## 2016-03-30 LAB — I-STAT TROPONIN, ED: Troponin i, poc: 0 ng/mL (ref 0.00–0.08)

## 2016-03-30 LAB — PROTIME-INR
INR: 1.12
Prothrombin Time: 14.5 seconds (ref 11.4–15.2)

## 2016-03-30 LAB — TSH: TSH: 0.381 u[IU]/mL (ref 0.350–4.500)

## 2016-03-30 MED ORDER — AMLODIPINE BESYLATE 10 MG PO TABS
10.0000 mg | ORAL_TABLET | Freq: Every day | ORAL | Status: DC
  Administered 2016-03-30: 10 mg via ORAL
  Filled 2016-03-30 (×2): qty 1

## 2016-03-30 MED ORDER — LORATADINE 10 MG PO TABS
10.0000 mg | ORAL_TABLET | Freq: Every day | ORAL | Status: DC
  Filled 2016-03-30: qty 1

## 2016-03-30 MED ORDER — ATORVASTATIN CALCIUM 10 MG PO TABS
10.0000 mg | ORAL_TABLET | Freq: Every day | ORAL | Status: DC
  Administered 2016-03-30 – 2016-03-31 (×2): 10 mg via ORAL
  Filled 2016-03-30 (×2): qty 1

## 2016-03-30 MED ORDER — ACETAMINOPHEN 325 MG PO TABS: 650.0000 mg | ORAL_TABLET | ORAL | Status: DC | PRN

## 2016-03-30 MED ORDER — ALBUTEROL SULFATE (2.5 MG/3ML) 0.083% IN NEBU: 3.0000 mL | INHALATION_SOLUTION | Freq: Four times a day (QID) | RESPIRATORY_TRACT | Status: DC | PRN

## 2016-03-30 MED ORDER — ZOLPIDEM TARTRATE 5 MG PO TABS: 5.0000 mg | ORAL_TABLET | Freq: Every evening | ORAL | Status: DC | PRN

## 2016-03-30 MED ORDER — SAW PALMETTO (SERENOA REPENS) 500 MG PO CAPS: 500.0000 mg | ORAL_CAPSULE | Freq: Every day | ORAL | Status: DC

## 2016-03-30 MED ORDER — ONDANSETRON HCL 4 MG/2ML IJ SOLN: 4.0000 mg | Freq: Four times a day (QID) | INTRAMUSCULAR | Status: DC | PRN

## 2016-03-30 MED ORDER — ENOXAPARIN SODIUM 40 MG/0.4ML ~~LOC~~ SOLN
40.0000 mg | SUBCUTANEOUS | Status: DC
  Administered 2016-03-31: 40 mg via SUBCUTANEOUS
  Filled 2016-03-30: qty 0.4

## 2016-03-30 MED ORDER — HYDRALAZINE HCL 20 MG/ML IJ SOLN: 5.0000 mg | INTRAMUSCULAR | Status: DC | PRN

## 2016-03-30 MED ORDER — NITROGLYCERIN 0.4 MG SL SUBL: 0.4000 mg | SUBLINGUAL_TABLET | SUBLINGUAL | Status: DC | PRN

## 2016-03-30 MED ORDER — ASPIRIN EC 325 MG PO TBEC
325.0000 mg | DELAYED_RELEASE_TABLET | Freq: Every day | ORAL | Status: DC
Start: 2016-03-30 — End: 2016-03-31
  Administered 2016-03-31: 325 mg via ORAL
  Filled 2016-03-30: qty 1

## 2016-03-30 MED ORDER — MOMETASONE FURO-FORMOTEROL FUM 100-5 MCG/ACT IN AERO: 2.0000 | INHALATION_SPRAY | RESPIRATORY_TRACT | Status: DC | PRN

## 2016-03-30 MED ORDER — MORPHINE SULFATE (PF) 4 MG/ML IV SOLN: 2.0000 mg | INTRAVENOUS | Status: DC | PRN

## 2016-03-30 MED ORDER — SODIUM CHLORIDE 0.9 % IV SOLN
INTRAVENOUS | Status: DC
  Administered 2016-03-30: 07:00:00 via INTRAVENOUS

## 2016-03-30 MED ORDER — LEVOTHYROXINE SODIUM 88 MCG PO TABS
88.0000 ug | ORAL_TABLET | Freq: Every day | ORAL | Status: DC
  Administered 2016-03-30 – 2016-03-31 (×2): 88 ug via ORAL
  Filled 2016-03-30 (×2): qty 1

## 2016-03-30 MED ORDER — OMEGA-3-ACID ETHYL ESTERS 1 G PO CAPS
1000.0000 mg | ORAL_CAPSULE | Freq: Every day | ORAL | Status: DC
  Administered 2016-03-30: 1000 mg via ORAL
  Filled 2016-03-30 (×2): qty 1

## 2016-03-30 NOTE — Consult Note (Signed)
Cardiology Consult Note  Admit date: 03/30/2016 Name: Jay [name] 83 y.o.  male DOB:  [birth year] MRN:  [id number]  Today's date:  03/30/2016  Referring Physician:    [facility]  Primary Physician:    Dr. Deforest Hoyles  Reason for Consultation:   Dizziness and chest pain   IMPRESSIONS: 1.  Predominant reason for admission was more dizziness rather than chest pain. 2.  Somewhat atypical chest pain that I doubt was ischemic in origin 3.  Prior history of aortic valve replacement in 2011 4.  Hypertension currently elevated 5.  Right bundle branch block 6.  Hypothyroidism under treatment 7.  Hyperlipidemia under treatment  RECOMMENDATION: Would continue to obtain serial troponins.  I was not particularly impressed with the chest pain history.  He did not have a whole lot of coronary artery disease at catheterization 6 years ago.  The history sounded to me more like he had dizziness and some mild diaphoresis.  1.  Obtain repeat echocardiogram 2.  Cycle serial troponins 3.  Telemetry monitoring to be sure he does not have significant bradycardia and monitor overnight. 4.  Echocardiogram to reassess aortic valve and fissure does not have wall motion abnormality. 5.  Monitor blood pressure, if it remains elevated he may need to start antihypertensive therapy.Jay [name]  HISTORY: This 83 year old male came to the emergency room last night with complaints of dizziness and diaphoresis.  He awoke to go to the bathroom this morning and had his onset of significant dizziness.  He was somewhat diaphoretic and felt hot.  He then laid back down but continued to have some sweating and called his daughter who is a retired Audiological scientist.  He was mildly nauseated and may have had some indigestion type symptoms that were transient.  He presented to the emergency room where the predominant complaint was listed as chest pain.  The family was fairly adamant that his major symptoms were dizziness and some diaphoresis.  His  daughter noted that he had a heart rate of around 47.  Initial EKG was read as atrial fibrillation but in retrospect is sinus rhythm.  He currently feels well and denies chest pain.  He normally is active and does not have anginal type chest pain.  He denies PND, orthopnea, syncope, or claudication.  The history is somewhat circuitous.  When he presented to the emergency room his blood pressure was quite elevated.  Multiple family members were in the room and history was obtained both from the patient as well as family members.  Past Medical History:  Diagnosis Date  . Allergic rhinitis    ,RAD wheezing  . Cataracts, bilateral   . Diverticulosis    , ACBE, flex, in 2003  . Dyslipidemia   . Fracture of left clavicle    , Hx  . Hearing loss    , Bilateral  . Hypothyroidism   . Insomnia   . Kidney stone   . OA (optic atrophy)   . Osteoarthritis   . Pelvic fracture (HCC)    , Hx  . RBBB (right bundle branch block)    , seen on EKG  . Varicose veins       Past Surgical History:  Procedure Laterality Date  . AORTIC VALVE REPLACEMENT  2011  . CATARACT EXTRACTION W/PHACO Right 02/17/2016   Procedure: CATARACT EXTRACTION PHACO AND INTRAOCULAR LENS PLACEMENT (IOC);  Surgeon: Ronnell Freshwater, MD;  Location: [facility];  Service: Ophthalmology;  Laterality: Right;  RIGHT  . INGUINAL HERNIA REPAIR  Bilateral   . Orchectomy Right   . THYROIDECTOMY       Allergies:  is allergic to aleve [naproxen sodium] and penicillin g benzathine.   Medications: Prior to Admission medications   Medication Sig Start Date End Date Taking? Authorizing Provider  albuterol (PROVENTIL HFA;VENTOLIN HFA) 108 (90 BASE) MCG/ACT inhaler Inhale 2 puffs into the lungs every 6 (six) hours as needed for wheezing or shortness of breath.   Yes Historical Provider, MD  aspirin 81 MG tablet Take 81 mg by mouth daily.   Yes Historical Provider, MD  atorvastatin (LIPITOR) 10 MG tablet Take 10 mg by mouth  daily.   Yes Historical Provider, MD  cetirizine (ZYRTEC) 10 MG tablet Take 10 mg by mouth as needed for allergies.    Yes Historical Provider, MD  levothyroxine (SYNTHROID, LEVOTHROID) 88 MCG tablet Take 88 mcg by mouth daily before breakfast.   Yes Historical Provider, MD  mometasone-formoterol (DULERA) 100-5 MCG/ACT AERO Inhale 2 puffs into the lungs as needed.    Yes Historical Provider, MD  Omega-3 300 MG CAPS Take 900 mg by mouth daily.   Yes Historical Provider, MD  saw palmetto 500 MG capsule Take 500 mg by mouth daily.   Yes Historical Provider, MD   Family History: Family Status  Relation Status  . Mother Deceased  . Father Deceased   Social History:   reports that he quit smoking about 51 years ago. His smokeless tobacco use includes Chew. He reports that he does not drink alcohol or use drugs.   He lives at home currently with his wife who had a previous stent, his daughter lives behind him.  Review of Systems: Weight has been stable, he is hard of hearing, has optic atrophy and reduced vision, has had some episodic indigestion, has arthritis of his hands, no prior history of syncope but did fall a number of years ago.  Other than as noted above the remainder of the review of systems is unremarkable.  Physical Exam: BP (!) 167/106 (BP Location: Right Arm)   Pulse 60   Temp 98.4 F (36.9 C) (Oral)   Resp 14   Ht 5\' 10"  (1.778 m)   Wt 74.7 kg (164 lb 9.6 oz)   SpO2 96%   BMI 23.62 kg/m    General appearance: He is an elderly pleasant male currently in no acute distress lying in bed Head: Normocephalic, without obvious abnormality, atraumatic, Balding male hair pattern Eyes: conjunctivae/corneas clear. PERRL, EOM's intact. Fundi benign.  Ears: Hearing aid present Neck: no adenopathy, no carotid bruit, no JVD and supple, symmetrical, trachea midline Lungs: clear to auscultation bilaterally, healed median sternotomy scar Heart: Regular rate and rhythm, normal S1 and S2,  no S3, very faint 1/6 systolic murmur left sternal border Abdomen: soft, non-tender; bowel sounds normal; no masses,  no organomegaly Rectal: deferred Extremities: extremities normal, atraumatic, no cyanosis or edema,  varicose veins noted Pulses: 2+ and symmetric Skin: Skin color, texture, turgor normal. No rashes or lesions Neurologic: Cranial nerves intact, normal strength and movement, Psych: Alert and oriented x 3 Labs: CBC  Recent Labs  03/30/16 0449  WBC 6.5  RBC 4.49  HGB 12.7*  HCT 38.3*  PLT 139*  MCV 85.3  MCH 28.3  MCHC 33.2  RDW 13.8   CMP   Recent Labs  03/30/16 0449  NA 138  K 3.9  CL 104  CO2 25  GLUCOSE 118*  BUN 15  CREATININE 1.21  CALCIUM 8.7*  GFRNONAA  52*  GFRAA 60*   Cardiac Panel (last 3 results) Troponin (Point of Care Test)  Recent Labs  03/30/16 0502  TROPIPOC 0.00    Radiology:  Independently reviewed by me.  Cardiomegaly, previous aortic valve replacement, otherwise unremarkable except for hyperinflation.  EKG:  sinus rhythm with PACs, right bundle branch block Independently reviewed by me   Telemetry: Independently reviewed does not show bradycardia, has PACs and sinus rhythm-no atrial fibrillation  Signed:  W. Doristine Church MD [facility]   Cardiology Consultant  03/30/2016, 9:32 AM

## 2016-03-30 NOTE — H&P (Signed)
History and Physical    Jay [name] [id number] DOB: 03/28/1926 DOA: 03/30/2016  Referring MD/NP/PA:   PCP: Wenda Low, MD   Patient coming from:  The patient is coming from home.  At baseline, pt is independent for most of ADL  Chief Complaint: chest pain shortness of breath  HPI: Jay [name] is a 83 y.o. male with medical history significant of right bundle branch block, HLD, hypothyroidism, varicose vein, poor hearing, aortic valve replacement with biovalve, who presents with chest pain and shortness of breath.  Pt states that his symptoms started at about 3:30 AM. His CP is located in the substernal area, mild, 2 out of 10 in severity, pressure-like, nonradiating. It is associated with shortness of breath. Patient states that he has mild dry cough, but no fever or chills. Denies tenderness in calf areas. Per his daughter, patient normally is active and moves around at home. Patient had nausea, but no vomiting, diarrhea, abdominal pain. Denies symptoms of urinary tract infection or unilateral weakness. Patient does not have a history of hypertension, but his blood pressure has recently elevated.   ED Course: pt was found to have blood pressure 186/106, WBC 6.5, INR 1.12, troponin negative, mild AKi with Cre 1.21, chest x-ray showed hyperinflation, but no infiltration. Patient is placed on telemetry bed for observation.  Review of Systems:   General: no fevers, chills, no changes in body weight, has fatigue HEENT: no blurry vision, hearing changes or sore throat Respiratory: has dyspnea, coughing, no wheezing CV: has chest pain, no palpitations GI: has nausea, no vomiting, abdominal pain, diarrhea, constipation GU: no dysuria, burning on urination, increased urinary frequency, hematuria  Ext: no leg edema Neuro: no unilateral weakness, numbness, or tingling, no vision change. Has hearing loss Skin: no rash, no skin tear. MSK: No muscle spasm, no deformity, no limitation of  range of movement in spin Heme: No easy bruising.  Travel history: No recent long distant travel.  Allergy:  Allergies  Allergen Reactions  . Aleve [Naproxen Sodium] Hives  . Penicillin G Benzathine     Pt doesn't remember reaction    Past Medical History:  Diagnosis Date  . Allergic rhinitis    ,RAD wheezing  . Cataracts, bilateral   . Diverticulosis    , ACBE, flex, in 2003  . Dyslipidemia   . Fracture of left clavicle    , Hx  . Hearing loss    , Bilateral  . Hypothyroidism   . Insomnia   . Kidney stone   . OA (optic atrophy)   . Osteoarthritis   . Pelvic fracture (HCC)    , Hx  . RBBB (right bundle branch block)    , seen on EKG  . Varicose veins     Past Surgical History:  Procedure Laterality Date  . ANOMALOUS PULMONARY VENOUS RETURN REPAIR    . CATARACT EXTRACTION W/PHACO Right 02/17/2016   Procedure: CATARACT EXTRACTION PHACO AND INTRAOCULAR LENS PLACEMENT (IOC);  Surgeon: Ronnell Freshwater, MD;  Location: [facility];  Service: Ophthalmology;  Laterality: Right;  RIGHT  . INGUINAL HERNIA REPAIR Bilateral   . Orchectomy Right   . THYROIDECTOMY      Social History:  reports that he quit smoking about 51 years ago. His smokeless tobacco use includes Chew. He reports that he does not drink alcohol or use drugs.  Family History:  Family History  Problem Relation Age of Onset  . Diabetes Mellitus II Mother      Prior  to Admission medications   Medication Sig Start Date End Date Taking? Authorizing Provider  albuterol (PROVENTIL HFA;VENTOLIN HFA) 108 (90 BASE) MCG/ACT inhaler Inhale 2 puffs into the lungs every 6 (six) hours as needed for wheezing or shortness of breath.   Yes Historical Provider, MD  aspirin 81 MG tablet Take 81 mg by mouth daily.   Yes Historical Provider, MD  atorvastatin (LIPITOR) 10 MG tablet Take 10 mg by mouth daily.   Yes Historical Provider, MD  cetirizine (ZYRTEC) 10 MG tablet Take 10 mg by mouth as needed for  allergies.    Yes Historical Provider, MD  levothyroxine (SYNTHROID, LEVOTHROID) 88 MCG tablet Take 88 mcg by mouth daily before breakfast.   Yes Historical Provider, MD  mometasone-formoterol (DULERA) 100-5 MCG/ACT AERO Inhale 2 puffs into the lungs as needed.    Yes Historical Provider, MD  Omega-3 300 MG CAPS Take 900 mg by mouth daily.   Yes Historical Provider, MD  saw palmetto 500 MG capsule Take 500 mg by mouth daily.   Yes Historical Provider, MD    Physical Exam: Vitals:   03/30/16 0444 03/30/16 0445 03/30/16 0530 03/30/16 0545  BP: (!) 186/106 183/85 172/92 155/87  Pulse: 70 68 68 64  Resp: 17 16 21 17   Temp: 97.9 F (36.6 C)     TempSrc: Oral     SpO2: 98% 97% 96% 96%  Weight: 75.8 kg (167 lb)     Height: 5\' 10"  (1.778 m)      General: Not in acute distress HEENT:       Eyes: PERRL, EOMI, no scleral icterus.       ENT: No discharge from the ears and nose, no pharynx injection, no tonsillar enlargement.        Neck: No JVD, no bruit, no mass felt. Heme: No neck lymph node enlargement. Cardiac: S1/S2, RRR, No murmurs, No gallops or rubs. Respiratory: No rales, wheezing, rhonchi or rubs. GI: Soft, nondistended, nontender, no rebound pain, no organomegaly, BS present. GU: No hematuria Ext: No pitting leg edema bilaterally. 2+DP/PT pulse bilaterally. Musculoskeletal: No joint deformities, No joint redness or warmth, no limitation of ROM in spin. Skin: No rashes.  Neuro: Alert, oriented X3, cranial nerves II-XII grossly intact, moves all extremities normally. Psych: Patient is not psychotic, no suicidal or hemocidal ideation.  Labs on Admission: I have personally reviewed following labs and imaging studies  CBC:  Recent Labs Lab 03/30/16 0449  WBC 6.5  HGB 12.7*  HCT 38.3*  MCV 85.3  PLT [id number]*   Basic Metabolic Panel:  Recent Labs Lab 03/30/16 0449  NA 138  K 3.9  CL 104  CO2 25  GLUCOSE 118*  BUN 15  CREATININE 1.21  CALCIUM 8.7*   GFR: Estimated  Creatinine Clearance: 43.6 mL/min (by C-G formula based on SCr of 1.21 mg/dL). Liver Function Tests: No results for input(s): AST, ALT, ALKPHOS, BILITOT, PROT, ALBUMIN in the last 168 hours. No results for input(s): LIPASE, AMYLASE in the last 168 hours. No results for input(s): AMMONIA in the last 168 hours. Coagulation Profile:  Recent Labs Lab 03/30/16 0449  INR 1.12   Cardiac Enzymes: No results for input(s): CKTOTAL, CKMB, CKMBINDEX, TROPONINI in the last 168 hours. BNP (last 3 results) No results for input(s): PROBNP in the last 8760 hours. HbA1C: No results for input(s): HGBA1C in the last 72 hours. CBG: No results for input(s): GLUCAP in the last 168 hours. Lipid Profile: No results for input(s): CHOL, HDL, LDLCALC,  TRIG, CHOLHDL, LDLDIRECT in the last 72 hours. Thyroid Function Tests: No results for input(s): TSH, T4TOTAL, FREET4, T3FREE, THYROIDAB in the last 72 hours. Anemia Panel: No results for input(s): VITAMINB12, FOLATE, FERRITIN, TIBC, IRON, RETICCTPCT in the last 72 hours. Urine analysis:    Component Value Date/Time   COLORURINE YELLOW 10/30/2009 [place] 10/30/2009 0924   LABSPEC 1.025 10/30/2009 0924   PHURINE 5.0 10/30/2009 0924   GLUCOSEU NEGATIVE 10/30/2009 0924   HGBUR NEGATIVE 10/30/2009 0924   BILIRUBINUR SMALL (A) 10/30/2009 0924   KETONESUR 15 (A) 10/30/2009 0924   PROTEINUR NEGATIVE 10/30/2009 0924   UROBILINOGEN 1.0 10/30/2009 0924   NITRITE NEGATIVE 10/30/2009 0924   LEUKOCYTESUR  10/30/2009 0924    NEGATIVE MICROSCOPIC NOT DONE ON URINES WITH NEGATIVE PROTEIN, BLOOD, LEUKOCYTES, NITRITE, OR GLUCOSE <1000 mg/dL.   Sepsis Labs: @LABRCNTIP (procalcitonin:4,lacticidven:4) )No results found for this or any previous visit (from the past 240 hour(s)).   Radiological Exams on Admission: Dg Chest 2 View  Result Date: 03/30/2016 CLINICAL DATA:  Dyspnea, onset at 02:30. Mild chest tightness as well. EXAM: CHEST  2 VIEW COMPARISON:   12/03/2009 FINDINGS: Moderate hyperinflation. Unchanged mild cardiomegaly. There is prior sternotomy with CABG and aortic valvuloplasty. The lungs are clear except for stable curvilinear scarring in the bases. There is no consolidation. There are no effusions. The pulmonary vasculature is normal. Hilar, mediastinal and cardiac contours are unchanged and unremarkable. IMPRESSION: Hyperinflation.  No consolidation or effusion.  Normal vasculature. Electronically Signed   By: Andreas Newport M.D.   On: 03/30/2016 05:28     EKG: Independently reviewed.Sinus rhythm, old right bundle blockage. QTC 472   Assessment/Plan Principal Problem:   Chest pain Active Problems:   Hypothyroidism   Dyslipidemia   RBBB (right bundle branch block)   H/O aortic valve replacement   AKI (acute kidney injury) ([place])   Essential hypertension   Chest pain: no signs of infection, chest x-ray is negative for pneumonia. Patient is normally active, no risk of DVT and no signs of DVT, less likely to have  PE. His blood pressure is significantly elevated at 186/106, indicating possible demand ischemia. Initial troponin negative.  - will place on Tele bed for obs - cycle CE q6 x3 and repeat her EKG in the am  - prn Nitroglycerin, Morphine, and aspirin, lipitor  - Risk factor stratification: will check FLP and A1C  - 2d echo - please call Card in AM  HTN: Patient does not have history of hypertension, but his blood pressure has recently elevated. His bp is 186/106 today. -start Amlodipine -IV Hydralazine  HLD: Last LDL was not on record -Continue home medications: Lipitor -Check FLP  Hypothyroidism: Last TSH was not on record -Continue home Synthroid -Check TSH  Mild AKI: Cre 1.21. Likely due to prerenal secondary to dehydration. - IVF: NS 75 cc/h - check UA - Follow up renal function by BMP  DVT ppx: SQ Lovenox Code Status: Full code Family Communication: Yes, patient's son and 2 daughters  at bed  side Disposition Plan:  Anticipate discharge back to previous home environment Consults called: none   Admission status: Obs / tele    Date of Service 03/30/2016    Ivor Costa [facility] Pager [phone]  If 7PM-7AM, please contact night-coverage [email or web address] Password [facility] 03/30/2016, 6:21 AM

## 2016-03-30 NOTE — ED Provider Notes (Signed)
[facility] DEPT Provider Note   CSN: [id number] Arrival date & time: 03/30/16  0435     History   Chief Complaint Chief Complaint  Patient presents with  . Shortness of Breath    HPI Jay [name] is a 83 y.o. male past medical history of right bundle branch block and atrial fibrillation, presenting today with shortness of breath. Patient states he woke up and was acutely short of breath. He became dizzy and diaphoretic. He had nausea with one episode of vomiting. He states he tried to lay back down but continually cannot catch his breath. He is concerned he may be having a heart attack and call his daughter. In route, he had left-sided pressure chest pain with EMS.  He was given aspirin. Currently he states his pain is now improved. He still feels mildly short of breath. There are no further complaints.  10 Systems reviewed and are negative for acute change except as noted in the HPI.    HPI  Past Medical History:  Diagnosis Date  . Allergic rhinitis    ,RAD wheezing  . Cataracts, bilateral   . Diverticulosis    , ACBE, flex, in 2003  . Dyslipidemia   . Fracture of left clavicle    , Hx  . Hearing loss    , Bilateral  . Hypothyroidism   . Insomnia   . Kidney stone   . OA (optic atrophy)   . Osteoarthritis   . Pelvic fracture (HCC)    , Hx  . RBBB (right bundle branch block)    , seen on EKG  . Varicose veins     Patient Active Problem List   Diagnosis Date Noted  . H/O aortic valve replacement 10/03/2013  . Hypothyroidism   . Dyslipidemia   . RBBB (right bundle branch block)     Past Surgical History:  Procedure Laterality Date  . ANOMALOUS PULMONARY VENOUS RETURN REPAIR    . CATARACT EXTRACTION W/PHACO Right 02/17/2016   Procedure: CATARACT EXTRACTION PHACO AND INTRAOCULAR LENS PLACEMENT (IOC);  Surgeon: Ronnell Freshwater, MD;  Location: [facility];  Service: Ophthalmology;  Laterality: Right;  RIGHT  . INGUINAL HERNIA REPAIR  Bilateral   . Orchectomy Right   . THYROIDECTOMY         Home Medications    Prior to Admission medications   Medication Sig Start Date End Date Taking? Authorizing Provider  albuterol (PROVENTIL HFA;VENTOLIN HFA) 108 (90 BASE) MCG/ACT inhaler Inhale 2 puffs into the lungs every 6 (six) hours as needed for wheezing or shortness of breath.   Yes Historical Provider, MD  aspirin 81 MG tablet Take 81 mg by mouth daily.   Yes Historical Provider, MD  atorvastatin (LIPITOR) 10 MG tablet Take 10 mg by mouth daily.   Yes Historical Provider, MD  cetirizine (ZYRTEC) 10 MG tablet Take 10 mg by mouth as needed for allergies.    Yes Historical Provider, MD  levothyroxine (SYNTHROID, LEVOTHROID) 88 MCG tablet Take 88 mcg by mouth daily before breakfast.   Yes Historical Provider, MD  mometasone-formoterol (DULERA) 100-5 MCG/ACT AERO Inhale 2 puffs into the lungs as needed.    Yes Historical Provider, MD  Omega-3 300 MG CAPS Take 900 mg by mouth daily.   Yes Historical Provider, MD  saw palmetto 500 MG capsule Take 500 mg by mouth daily.   Yes Historical Provider, MD    Family History Family History  Problem Relation Age of Onset  . Family history unknown:  Yes    Social History Social History  Substance Use Topics  . Smoking status: Former Smoker    Quit date: 03/30/1965  . Smokeless tobacco: Current User    Types: Chew  . Alcohol use No     Comment: Rarely     Allergies   Aleve [naproxen sodium] and Penicillin g benzathine   Review of Systems Review of Systems   Physical Exam Updated Vital Signs BP 183/85   Pulse 68   Temp 97.9 F (36.6 C) (Oral)   Resp 16   Ht 5\' 10"  (1.778 m)   Wt 167 lb (75.8 kg)   SpO2 97%   BMI 23.96 kg/m   Physical Exam  Constitutional: He is oriented to person, place, and time. Vital signs are normal. He appears well-developed and well-nourished.  Non-toxic appearance. He does not appear ill. No distress.  HENT:  Head: Normocephalic and  atraumatic.  Nose: Nose normal.  Mouth/Throat: Oropharynx is clear and moist. No oropharyngeal exudate.  Eyes: Conjunctivae and EOM are normal. Pupils are equal, round, and reactive to light. No scleral icterus.  Neck: Normal range of motion. Neck supple. No tracheal deviation, no edema, no erythema and normal range of motion present. No thyroid mass and no thyromegaly present.  Cardiovascular: Normal rate, S1 normal, S2 normal, normal heart sounds, intact distal pulses and normal pulses.  Exam reveals no gallop and no friction rub.   No murmur heard. Irregularly irregular rhythm  Pulmonary/Chest: Effort normal and breath sounds normal. No respiratory distress. He has no wheezes. He has no rhonchi. He has no rales.  Abdominal: Soft. Normal appearance and bowel sounds are normal. He exhibits no distension, no ascites and no mass. There is no hepatosplenomegaly. There is no tenderness. There is no rebound, no guarding and no CVA tenderness.  Musculoskeletal: Normal range of motion. He exhibits edema. He exhibits no tenderness.  Lymphadenopathy:    He has no cervical adenopathy.  Neurological: He is alert and oriented to person, place, and time. He has normal strength. No cranial nerve deficit or sensory deficit.  Skin: Skin is warm, dry and intact. No petechiae and no rash noted. He is not diaphoretic. No erythema. No pallor.  Nursing note and vitals reviewed.    ED Treatments / Results  Labs (all labs ordered are listed, but only abnormal results are displayed) Labs Reviewed  BASIC METABOLIC PANEL - Abnormal; Notable for the following:       Result Value   Glucose, Bld 118 (*)    Calcium 8.7 (*)    GFR calc non Af Amer 52 (*)    GFR calc Af Amer 60 (*)    All other components within normal limits  CBC - Abnormal; Notable for the following:    Hemoglobin 12.7 (*)    HCT 38.3 (*)    Platelets 139 (*)    All other components within normal limits  PROTIME-INR  I-STAT TROPOININ, ED     EKG  EKG Interpretation  Date/Time:  Monday March 30 2016 04:41:22 EST Ventricular Rate:  71 PR Interval:    QRS Duration: 148 QT Interval:  434 QTC Calculation: 472 R Axis:   71 Text Interpretation:  Sinus rhythm Atrial premature complex Right bundle branch block No significant change since last tracing Confirmed by Glynn Octave [phone]) on 03/30/2016 4:50:08 AM       Radiology No results found.  Procedures Procedures (including critical care time)  Medications Ordered in ED Medications - No  data to display   Initial Impression / Assessment and Plan / ED Course  I have reviewed the triage vital signs and the nursing notes.  Pertinent labs & imaging results that were available during my care of the patient were reviewed by me and considered in my medical decision making (see chart for details).  Clinical Course     Patient presents to the ED for SOB and diaphoresis.  His history is concerning for ACS.  Initial troponin is negative.  EKG is unchanged from previous.  Will page hospitalist for further care.    Final Clinical Impressions(s) / ED Diagnoses   Final diagnoses:  None    New Prescriptions New Prescriptions   No medications on file     Everlene Balls, MD 03/30/16 [phone]

## 2016-03-30 NOTE — Progress Notes (Signed)
  Echocardiogram 2D Echocardiogram has been performed.  Donata Clay 03/30/2016, 2:17 PM

## 2016-03-30 NOTE — Progress Notes (Signed)
Pt's orthostatic V/S are positive. MD on call aware. Pt denies any dizziness. Pt has walked to the bathroom with standby assistance and has a steady gait. Pt and family updated. Will cont to monitor pt.

## 2016-03-30 NOTE — Progress Notes (Addendum)
Patient ID: Jay [name], male   DOB: May 17, 1925, 83 y.o.   MRN: [id number]  Pt admitted after midnight. For details, ,pleaserefer to admission note done 03/30/2016.  Pt seen and examined at the bedside. His wife and daughter were present at the bedside.  83 y.o. male with past medical history significant for right bundle branch block, HLD, hypothyroidism, aortic valve replacement with biovalve. Pt apparently had cataract surgery about 4 weeks PTA. Since then his BP was on a higher side but prior to that his BP usually on soft side. Since then he has been feeling intermittently dizzy but no associated lightheadedness or falls. This am around 2 am he woke up and got up, felt dizzy and as he went to bathroom he started to feel sweaty and short of breath. He drank water but he symptoms have not improved. He never had chest pain but perhaps indigestion or pressure type of feeling in upper abdomen or lower chest area.  On admission, BP was 186/106, HR 71, RR 14, normal oxygen saturation. Blood work was notable for hemoglobin of 12.7, normal troponin. CXR showed no consolidation or effusion. Cardio has been consulted.  Assessment and Plan:  Acute respiratory distress without hypoxia - Unclear etiology - Troponi levels WNL - His last 2 D ECHO in 10/2015 with normal EF - 2 D ECHO on this admission is pending - BNP not obtained on this admission - Cardio will see in consultation  Dizziness - Now pt feels better - Obtain PT eval - May need MRI brain if he continues to experience dizziness or gait instability   Leisa Lenz [facility] [id number]

## 2016-03-30 NOTE — ED Triage Notes (Signed)
Pt from home with Shortness of breath this morning starting at 0230 and some mild chest tightness that has resolved now. Pt given 324mg  ASA with EMS. Pt not in any distress.

## 2016-03-31 DIAGNOSIS — I2510 Atherosclerotic heart disease of native coronary artery without angina pectoris: Secondary | ICD-10-CM

## 2016-03-31 DIAGNOSIS — R0789 Other chest pain: Secondary | ICD-10-CM | POA: Diagnosis not present

## 2016-03-31 DIAGNOSIS — Z952 Presence of prosthetic heart valve: Secondary | ICD-10-CM

## 2016-03-31 DIAGNOSIS — R42 Dizziness and giddiness: Secondary | ICD-10-CM

## 2016-03-31 DIAGNOSIS — R079 Chest pain, unspecified: Secondary | ICD-10-CM | POA: Diagnosis not present

## 2016-03-31 LAB — HEMOGLOBIN A1C
Hgb A1c MFr Bld: 5.4 % (ref 4.8–5.6)
Mean Plasma Glucose: 108 mg/dL

## 2016-03-31 NOTE — Progress Notes (Addendum)
Patient Name: Jay [name] Date of Encounter: 03/31/2016  Primary Cardiologist: [facility] Problem List     Principal Problem:   Chest pain Active Problems:   Hypothyroidism   Dyslipidemia   RBBB (right bundle branch block)   H/O aortic valve replacement   Essential hypertension   CAD (coronary artery disease), native coronary artery   Dizziness     Subjective   Doing well, no further chest pain. He was getting up in the middle the night to go to the bathroom and became pale, diaphoretic. He has battled with orthostatic hypotension for quite some time. He may have had some chest pressure at that time. No longer having any pain.  Inpatient Medications    Scheduled Meds: . amLODipine  10 mg Oral Daily  . aspirin EC  325 mg Oral Daily  . atorvastatin  10 mg Oral Daily  . enoxaparin (LOVENOX) injection  40 mg Subcutaneous Q24H  . levothyroxine  88 mcg Oral QAC breakfast  . loratadine  10 mg Oral Daily  . omega-3 acid ethyl esters  1,000 mg Oral Daily   Continuous Infusions: . sodium chloride 75 mL/hr at 03/30/16 0701   PRN Meds: acetaminophen, albuterol, hydrALAZINE, mometasone-formoterol, morphine injection, nitroGLYCERIN, ondansetron (ZOFRAN) IV, zolpidem   Vital Signs    Vitals:   03/30/16 2055 03/31/16 0016 03/31/16 0500 03/31/16 0600  BP: (!) 152/77 124/70  (!) 146/85  Pulse: 66 64  65  Resp:    20  Temp: 98.2 F (36.8 C) 98.6 F (37 C)  97.8 F (36.6 C)  TempSrc: Oral Oral  Oral  SpO2: 97% 95%  98%  Weight:   165 lb 11.2 oz (75.2 kg)   Height:        Intake/Output Summary (Last 24 hours) at 03/31/16 0943 Last data filed at 03/31/16 0838  Gross per 24 hour  Intake          2053.75 ml  Output                0 ml  Net          2053.75 ml   Filed Weights   03/30/16 0444 03/30/16 0814 03/31/16 0500  Weight: 167 lb (75.8 kg) 164 lb 9.6 oz (74.7 kg) 165 lb 11.2 oz (75.2 kg)    Physical Exam    GEN: Well nourished, well developed, in no  acute distress.  HEENT: Grossly normal.  Neck: Supple, no JVD, carotid bruits, or masses. Cardiac: RRR, 1/6 systolic murmur,no rubs, or gallops. No clubbing, cyanosis, edema.  Radials/DP/PT 2+ and equal bilaterally.  Respiratory:  Respirations regular and unlabored, clear to auscultation bilaterally. GI: Soft, nontender, nondistended, BS + x 4. MS: no deformity or atrophy. Skin: warm and dry, no rash. Neuro:  Strength and sensation are intact. Psych: AAOx3.  Normal affect.  Labs    CBC  Recent Labs  03/30/16 0449  WBC 6.5  HGB 12.7*  HCT 38.3*  MCV 85.3  PLT [id number]*   Basic Metabolic Panel  Recent Labs  03/30/16 0449  NA 138  K 3.9  CL 104  CO2 25  GLUCOSE 118*  BUN 15  CREATININE 1.21  CALCIUM 8.7*   Liver Function Tests No results for input(s): AST, ALT, ALKPHOS, BILITOT, PROT, ALBUMIN in the last 72 hours. No results for input(s): LIPASE, AMYLASE in the last 72 hours. Cardiac Enzymes  Recent Labs  03/30/16 0923 03/30/16 1340 03/30/16 2036  TROPONINI <0.03 <0.03 <0.03  BNP Invalid input(s): POCBNP D-Dimer No results for input(s): DDIMER in the last 72 hours. Hemoglobin A1C  Recent Labs  03/30/16 0652  HGBA1C 5.4   Fasting Lipid Panel  Recent Labs  03/30/16 0652  CHOL 135  HDL 38*  LDLCALC 83  TRIG 68  CHOLHDL 3.6   Thyroid Function Tests  Recent Labs  03/30/16 0652  TSH 0.381    Telemetry    No adverse arrhythmias - Personally Reviewed  ECG    Sinus rhythm, PACs, right bundle branch block - Personally Reviewed  Radiology    Dg Chest 2 View  Result Date: 03/30/2016 CLINICAL DATA:  Dyspnea, onset at 02:30. Mild chest tightness as well. EXAM: CHEST  2 VIEW COMPARISON:  12/03/2009 FINDINGS: Moderate hyperinflation. Unchanged mild cardiomegaly. There is prior sternotomy with CABG and aortic valvuloplasty. The lungs are clear except for stable curvilinear scarring in the bases. There is no consolidation. There are no effusions. The  pulmonary vasculature is normal. Hilar, mediastinal and cardiac contours are unchanged and unremarkable. IMPRESSION: Hyperinflation.  No consolidation or effusion.  Normal vasculature. Electronically Signed   By: Andreas Newport M.D.   On: 03/30/2016 05:28    Cardiac Studies   Echocardiogram 03/30/16  - Left ventricle: The cavity size was normal. There was mild   concentric hypertrophy. Systolic function was normal. The   estimated ejection fraction was in the range of 60% to 65%. Wall   motion was normal; there were no regional wall motion   abnormalities. Features are consistent with a pseudonormal left   ventricular filling pattern, with concomitant abnormal relaxation   and increased filling pressure (grade 2 diastolic dysfunction).   Doppler parameters are consistent with high ventricular filling   pressure. - Aortic valve: Poorly visualized. Mildly thickened, mildly   calcified leaflets. A bioprosthesis was present. There was mild   regurgitation. Valve area (VTI): 2.32 cm^2. Valve area (Vmax):   2.06 cm^2. Valve area (Vmean): 2 cm^2. - Mitral valve: Poorly visualized. Calcified annulus. Mildly   thickened leaflets . There was mild regurgitation.  Patient Profile     83 year old male with dizziness and chest pain perhaps.  Assessment & Plan    Dizziness/Orthostasis/dysautonomia/vagal-like episode  - May be related to orthostatic hypotension which has been an ongoing issue with him.  - Be careful with over aggressive blood pressure control.  - Discussed with him once again about salt liberalization. He wears TED hose at home. Be careful when getting out of bed especially in the middle of the night. We discussed again.  - OK to DC amlodipine.   Chest pain  - He may of had some pressure in his chest at the time of his vagal-like episode. Atypical. Does not sound ischemic in origin. Troponins and been normal. No further cardiac testing.  - Echocardiogram reassuring with normal  wall motion.  Aortic valve replacement  - Bioprosthetic, normal function.  Okay with discharge.  Signed, Candee Furbish, MD  03/31/2016, 9:43 AM

## 2016-03-31 NOTE — Discharge Summary (Addendum)
Physician Discharge Summary  Jay [name] [id number] DOB: 1925-10-15 DOA: 03/30/2016  PCP: Wenda Low, MD  Admit date: 03/30/2016 Discharge date: 03/31/2016  Recommendations for Outpatient Follow-up:  1. Follow up with PCP and cardio per scheduled appt  Discharge Diagnoses:  Principal Problem:   Chest pain Active Problems:   Hypothyroidism   Dyslipidemia   RBBB (right bundle branch block)   H/O aortic valve replacement   Essential hypertension   CAD (coronary artery disease), native coronary artery   Dizziness    Discharge Condition: stable   Diet recommendation: as tolerated   History of present illness:  83 y.o.malewith past medical history significant for right bundle branch block, HLD, hypothyroidism, aortic valve replacement with biovalve. Pt apparently had cataract surgery about 4 weeks PTA. Since then his BP was on a higher side but prior to that his BP usually on soft side. Since then he has been feeling intermittently dizzy but no associated lightheadedness or falls. This am around 2 am he woke up and got up, felt dizzy and as he went to bathroom he started to feel sweaty and short of breath. He drank water but he symptoms have not improved. He never had chest pain but perhaps indigestion or pressure type of feeling in upper abdomen or lower chest area.  On admission, BP was 186/106, HR 71, RR 14, normal oxygen saturation. Blood work was notable for hemoglobin of 12.7, normal troponin. CXR showed no consolidation or effusion. Cardio has been consulted.  Hospital Course:    Assessment and Plan:  Acute respiratory distress without hypoxia / Chest pain at rest - Unclear etiology - Troponi levels WNL - His last 2 D ECHO in 10/2015 with normal EF - 2 D ECHO on this admission showed normal EF with grade 2 DD - No further diagnostic studies required at this time per cardio  Dizziness - Resolved at this point, ambulates without assistance   Signed:  Leisa Lenz, MD  [facility] 03/31/2016, 11:18 AM  Pager #: [phone]  Time spent in minutes: less than 30 minutes  Procedures:  2 D ECHO, normal EF and grade 2 DD  Consultations:  Cardio  Discharge Exam: Vitals:   03/31/16 0600 03/31/16 1006  BP: (!) 146/85 (!) 143/89  Pulse: 65   Resp: 20   Temp: 97.8 F (36.6 C)    Vitals:   03/31/16 0016 03/31/16 0500 03/31/16 0600 03/31/16 1006  BP: 124/70  (!) 146/85 (!) 143/89  Pulse: 64  65   Resp:   20   Temp: 98.6 F (37 C)  97.8 F (36.6 C)   TempSrc: Oral  Oral   SpO2: 95%  98%   Weight:  75.2 kg (165 lb 11.2 oz)    Height:        General: Pt is alert, follows commands appropriately, not in acute distress Cardiovascular: Regular rate and rhythm, S1/S2 + Respiratory: Clear to auscultation bilaterally, no wheezing, no crackles, no rhonchi Abdominal: Soft, non tender, non distended, bowel sounds +, no guarding Extremities: no edema, no cyanosis, pulses palpable bilaterally DP and PT Neuro: Grossly nonfocal  Discharge Instructions  Discharge Instructions    Call MD for:  persistant nausea and vomiting    Complete by:  As directed    Call MD for:  redness, tenderness, or signs of infection (pain, swelling, redness, odor or green/yellow discharge around incision site)    Complete by:  As directed    Call MD for:  severe uncontrolled pain  Complete by:  As directed    Diet - low sodium heart healthy    Complete by:  As directed    Increase activity slowly    Complete by:  As directed      Allergies as of 03/31/2016      Reactions   Aleve [naproxen Sodium] Hives   Penicillin G Benzathine    Pt doesn't remember reaction      Medication List    TAKE these medications   albuterol 108 (90 Base) MCG/ACT inhaler Commonly known as:  PROVENTIL HFA;VENTOLIN HFA Inhale 2 puffs into the lungs every 6 (six) hours as needed for wheezing or shortness of breath.   aspirin 81 MG tablet Take 81 mg by mouth daily.    atorvastatin 10 MG tablet Commonly known as:  LIPITOR Take 10 mg by mouth daily.   cetirizine 10 MG tablet Commonly known as:  ZYRTEC Take 10 mg by mouth as needed for allergies.   levothyroxine 88 MCG tablet Commonly known as:  SYNTHROID, LEVOTHROID Take 88 mcg by mouth daily before breakfast.   mometasone-formoterol 100-5 MCG/ACT Aero Commonly known as:  DULERA Inhale 2 puffs into the lungs as needed.   Omega-3 300 MG Caps Take 900 mg by mouth daily.   saw palmetto 500 MG capsule Take 500 mg by mouth daily.      Follow-up Information    HUSAIN,KARRAR, MD. Schedule an appointment as soon as possible for a visit in 1 week(s).   Specialty:  Internal Medicine Contact information: [address] [place] [address] [phone]            The results of significant diagnostics from this hospitalization (including imaging, microbiology, ancillary and laboratory) are listed below for reference.    Significant Diagnostic Studies: Dg Chest 2 View  Result Date: 03/30/2016 CLINICAL DATA:  Dyspnea, onset at 02:30. Mild chest tightness as well. EXAM: CHEST  2 VIEW COMPARISON:  12/03/2009 FINDINGS: Moderate hyperinflation. Unchanged mild cardiomegaly. There is prior sternotomy with CABG and aortic valvuloplasty. The lungs are clear except for stable curvilinear scarring in the bases. There is no consolidation. There are no effusions. The pulmonary vasculature is normal. Hilar, mediastinal and cardiac contours are unchanged and unremarkable. IMPRESSION: Hyperinflation.  No consolidation or effusion.  Normal vasculature. Electronically Signed   By: Andreas Newport M.D.   On: 03/30/2016 05:28    Microbiology: No results found for this or any previous visit (from the past 240 hour(s)).   Labs: Basic Metabolic Panel:  Recent Labs Lab 03/30/16 0449  NA 138  K 3.9  CL 104  CO2 25  GLUCOSE 118*  BUN 15  CREATININE 1.21  CALCIUM 8.7*   Liver Function  Tests: No results for input(s): AST, ALT, ALKPHOS, BILITOT, PROT, ALBUMIN in the last 168 hours. No results for input(s): LIPASE, AMYLASE in the last 168 hours. No results for input(s): AMMONIA in the last 168 hours. CBC:  Recent Labs Lab 03/30/16 0449  WBC 6.5  HGB 12.7*  HCT 38.3*  MCV 85.3  PLT 139*   Cardiac Enzymes:  Recent Labs Lab 03/30/16 0923 03/30/16 1340 03/30/16 2036  TROPONINI <0.03 <0.03 <0.03   BNP: BNP (last 3 results) No results for input(s): BNP in the last 8760 hours.  ProBNP (last 3 results) No results for input(s): PROBNP in the last 8760 hours.  CBG: No results for input(s): GLUCAP in the last 168 hours.

## 2016-03-31 NOTE — Evaluation (Signed)
Physical Therapy Evaluation Patient Details Name: Jay [name] MRN: [id number] DOB: 12/02/1925 Today's Date: 03/31/2016   History of Present Illness  Patient is a 83 y/o male with hx of RBBB, dyslipidemia, hearing loss, aortic valve replacement with biovalve presents with chest pain and SOB. Pt was found to have blood pressure 186/106  Clinical Impression  Patient tolerated gait training without evidence of LOB or difficulty. Orthostatic vitals performed.  Supine BP 126/73 Sitting BP 126/65 Standing BP 104/66 Standing post ambulation BP 136/79 Pt independent PTA. Reports no symptoms with changes in position despite the 22 point systolic drop. Lengthy education about sitting EOB first thing in the morning and when changing positions prior to mobility to ensure BP stabilizes. Pt agreeable. Pt does not require skilled therapy services. Discharge from therapy.    Follow Up Recommendations No PT follow up    Equipment Recommendations  None recommended by PT    Recommendations for Other Services       Precautions / Restrictions Precautions Precautions: None Restrictions Weight Bearing Restrictions: No      Mobility  Bed Mobility Overal bed mobility: Modified Independent                Transfers Overall transfer level: Modified independent               General transfer comment: No dizziness, or assist needed.   Ambulation/Gait Ambulation/Gait assistance: Modified independent (Device/Increase time) Ambulation Distance (Feet): 250 Feet Assistive device: None Gait Pattern/deviations: Step-through pattern;Decreased stride length   Gait velocity interpretation: at or above normal speed for age/gender General Gait Details: Steady gait without dizziness or LOB. No DOE.   Stairs            Wheelchair Mobility    Modified Rankin (Stroke Patients Only)       Balance Overall balance assessment: Needs assistance Sitting-balance support: Feet supported;No  upper extremity supported Sitting balance-Leahy Scale: Good Sitting balance - Comments: Able to reach outside BoS and donn shoes without difficulty.    Standing balance support: During functional activity Standing balance-Leahy Scale: Good                               Pertinent Vitals/Pain Pain Assessment: No/denies pain    Home Living Family/patient expects to be discharged to:: Private residence Living Arrangements: Spouse/significant other Available Help at Discharge: Family;Available 24 hours/day Type of Home: House Home Access: Level entry     Home Layout: One level Home Equipment: None      Prior Function Level of Independence: Independent         Comments: Mows lawn, drives, cares for wife.      Hand Dominance        Extremity/Trunk Assessment   Upper Extremity Assessment Upper Extremity Assessment: Defer to OT evaluation    Lower Extremity Assessment Lower Extremity Assessment: Overall WFL for tasks assessed       Communication   Communication: HOH  Cognition Arousal/Alertness: Awake/alert Behavior During Therapy: WFL for tasks assessed/performed (irritated about not being d/ced yet.) Overall Cognitive Status: Within Functional Limits for tasks assessed                      General Comments General comments (skin integrity, edema, etc.): Wife and daughter present during session.    Exercises     Assessment/Plan    PT Assessment Patent does not need any further PT services  PT Problem List            PT Treatment Interventions      PT Goals (Current goals can be found in the Care Plan section)  Acute Rehab PT Goals Patient Stated Goal: to go home PT Goal Formulation: All assessment and education complete, DC therapy    Frequency     Barriers to discharge        Co-evaluation               End of Session   Activity Tolerance: Patient tolerated treatment well Patient left: in bed;with family/visitor  present;with call bell/phone within reach Nurse Communication: Mobility status    Functional Assessment Tool Used: clinical judgment Functional Limitation: Mobility: Walking and moving around Mobility: Walking and Moving Around Current Status [id number]): At least 1 percent but less than 20 percent impaired, limited or restricted Mobility: Walking and Moving Around Goal Status [phone]): At least 1 percent but less than 20 percent impaired, limited or restricted Mobility: Walking and Moving Around Discharge Status [phone]): At least 1 percent but less than 20 percent impaired, limited or restricted    Time: [phone] PT Time Calculation (min) (ACUTE ONLY): 26 min   Charges:   PT Evaluation $PT Eval Low Complexity: 1 Procedure PT Treatments $Gait Training: 8-22 mins   PT G Codes:   PT G-Codes **NOT FOR INPATIENT CLASS** Functional Assessment Tool Used: clinical judgment Functional Limitation: Mobility: Walking and moving around Mobility: Walking and Moving Around Current Status [id number]): At least 1 percent but less than 20 percent impaired, limited or restricted Mobility: Walking and Moving Around Goal Status [phone]): At least 1 percent but less than 20 percent impaired, limited or restricted Mobility: Walking and Moving Around Discharge Status [phone]): At least 1 percent but less than 20 percent impaired, limited or restricted    [place] 03/31/2016, 9:51 AM Jay [name], PT, DPT [phone]

## 2016-03-31 NOTE — Care Management Note (Signed)
Case Management Note  Patient Details  Name: Jay [name] MRN: [id number] Date of Birth: 10/02/1925  Subjective/Objective: Pt presented for chest pain. Pt is from home with wife and plans to return home with wife.  PTA pt was independent.                   Action/Plan: No needs identified at time of visit. No further needs from CM at this time.    Expected Discharge Date:                  Expected Discharge Plan:  Home/Self Care  In-House Referral:  NA  Discharge planning Services  CM Consult  Post Acute Care Choice:  NA Choice offered to:  NA  DME Arranged:  N/A DME Agency:  NA  HH Arranged:  NA HH Agency:  NA  Status of Service:  Completed, signed off  If discussed at [place] of Stay Meetings, dates discussed:    Additional Comments:  Bethena Roys, RN 03/31/2016, 11:31 AM

## 2016-03-31 NOTE — Care Management Obs Status (Signed)
[facility] NOTIFICATION   Patient Details  Name: Jay [name] MRN: [id number] Date of Birth: 23-Nov-1925   Medicare Observation Status Notification Given:  Yes    Bethena Roys, RN 03/31/2016, 11:28 AM

## 2016-03-31 NOTE — Discharge Instructions (Signed)
Shortness of Breath  Shortness of breath means you have trouble breathing. Shortness of breath needs medical care right away.  HOME CARE   ? Do not smoke.  ? Avoid being around chemicals or things (paint fumes, dust) that may bother your breathing.  ? Rest as needed. Slowly begin your normal activities.  ? Only take medicines as told by your doctor.  ? Keep all doctor visits as told.  GET HELP RIGHT AWAY IF:   ? Your shortness of breath gets worse.  ? You feel lightheaded, pass out (faint), or have a cough that is not helped by medicine.  ? You cough up blood.  ? You have pain with breathing.  ? You have pain in your chest, arms, shoulders, or belly (abdomen).  ? You have a fever.  ? You cannot walk up stairs or exercise the way you normally do.  ? You do not get better in the time expected.  ? You have a hard time doing normal activities even with rest.  ? You have problems with your medicines.  ? You have any new symptoms.  MAKE SURE YOU:  ? Understand these instructions.  ? Will watch your condition.  ? Will get help right away if you are not doing well or get worse.  This information is not intended to replace advice given to you by your health care provider. Make sure you discuss any questions you have with your health care provider.  Document Released: 09/02/2007 Document Revised: 03/21/2013 Document Reviewed: 06/01/2011  Elsevier Interactive Patient Education ? 2017 [employer]

## 2016-04-07 DIAGNOSIS — Z952 Presence of prosthetic heart valve: Secondary | ICD-10-CM | POA: Diagnosis not present

## 2016-04-07 DIAGNOSIS — R0602 Shortness of breath: Secondary | ICD-10-CM | POA: Diagnosis not present

## 2016-04-07 DIAGNOSIS — R42 Dizziness and giddiness: Secondary | ICD-10-CM | POA: Diagnosis not present

## 2016-04-10 NOTE — Progress Notes (Signed)
PT evaluation addendum Late entry for 03/31/16 Visit diagnosis:    03/31/16 0951  PT Assessment  PT Recommendation/Assessment Patent does not need any further PT services (visit diagnosis:  Other abnormalities of gait and mobility)  No Skilled PT Patient at baseline level of functioning;Patient is modified independent with all activity/mobility;All education completed   04/10/2016 [place], [place]

## 2016-06-22 ENCOUNTER — Inpatient Hospital Stay
Admission: EM | Admit: 2016-06-22 | Discharge: 2016-06-25 | DRG: 269 | Disposition: A | Discharge status: Home / Self Care | Payer: Medicare Other | Attending: Vascular Surgery | Admitting: Vascular Surgery

## 2016-06-22 ENCOUNTER — Emergency Department: Payer: Medicare Other | Admitting: Anesthesiology

## 2016-06-22 ENCOUNTER — Emergency Department: Payer: Medicare Other

## 2016-06-22 ENCOUNTER — Encounter: Payer: Self-pay | Admitting: Emergency Medicine

## 2016-06-22 ENCOUNTER — Encounter
Admission: EM | Disposition: A | Discharge status: Home / Self Care | Payer: Self-pay | Source: Home / Self Care | Attending: Vascular Surgery

## 2016-06-22 DIAGNOSIS — I714 Abdominal aortic aneurysm, without rupture: Secondary | ICD-10-CM | POA: Diagnosis not present

## 2016-06-22 DIAGNOSIS — I129 Hypertensive chronic kidney disease with stage 1 through stage 4 chronic kidney disease, or unspecified chronic kidney disease: Secondary | ICD-10-CM | POA: Diagnosis not present

## 2016-06-22 DIAGNOSIS — I4510 Unspecified right bundle-branch block: Secondary | ICD-10-CM | POA: Diagnosis not present

## 2016-06-22 DIAGNOSIS — Z720 Tobacco use: Secondary | ICD-10-CM

## 2016-06-22 DIAGNOSIS — H4720 Unspecified optic atrophy: Secondary | ICD-10-CM | POA: Diagnosis not present

## 2016-06-22 DIAGNOSIS — I713 Abdominal aortic aneurysm, ruptured, unspecified: Secondary | ICD-10-CM | POA: Diagnosis present

## 2016-06-22 DIAGNOSIS — E785 Hyperlipidemia, unspecified: Secondary | ICD-10-CM | POA: Diagnosis not present

## 2016-06-22 DIAGNOSIS — I2510 Atherosclerotic heart disease of native coronary artery without angina pectoris: Secondary | ICD-10-CM | POA: Diagnosis not present

## 2016-06-22 DIAGNOSIS — E039 Hypothyroidism, unspecified: Secondary | ICD-10-CM | POA: Diagnosis present

## 2016-06-22 DIAGNOSIS — Z961 Presence of intraocular lens: Secondary | ICD-10-CM | POA: Diagnosis present

## 2016-06-22 DIAGNOSIS — I719 Aortic aneurysm of unspecified site, without rupture: Secondary | ICD-10-CM | POA: Diagnosis not present

## 2016-06-22 DIAGNOSIS — H9193 Unspecified hearing loss, bilateral: Secondary | ICD-10-CM | POA: Diagnosis present

## 2016-06-22 DIAGNOSIS — Z7982 Long term (current) use of aspirin: Secondary | ICD-10-CM

## 2016-06-22 DIAGNOSIS — R109 Unspecified abdominal pain: Secondary | ICD-10-CM | POA: Diagnosis not present

## 2016-06-22 DIAGNOSIS — Z9841 Cataract extraction status, right eye: Secondary | ICD-10-CM

## 2016-06-22 DIAGNOSIS — R1032 Left lower quadrant pain: Secondary | ICD-10-CM | POA: Diagnosis not present

## 2016-06-22 DIAGNOSIS — F05 Delirium due to known physiological condition: Secondary | ICD-10-CM | POA: Diagnosis not present

## 2016-06-22 DIAGNOSIS — Z953 Presence of xenogenic heart valve: Secondary | ICD-10-CM

## 2016-06-22 DIAGNOSIS — N189 Chronic kidney disease, unspecified: Secondary | ICD-10-CM | POA: Diagnosis not present

## 2016-06-22 DIAGNOSIS — Z79899 Other long term (current) drug therapy: Secondary | ICD-10-CM

## 2016-06-22 DIAGNOSIS — I10 Essential (primary) hypertension: Secondary | ICD-10-CM | POA: Diagnosis not present

## 2016-06-22 HISTORY — PX: ABDOMINAL AORTOGRAM: CATH118222

## 2016-06-22 HISTORY — PX: AORTIC INTERVENTION: CATH118225

## 2016-06-22 LAB — CBC
HCT: 40 % (ref 40.0–52.0)
Hemoglobin: 13.4 g/dL (ref 13.0–18.0)
MCH: 28.4 pg (ref 26.0–34.0)
MCHC: 33.4 g/dL (ref 32.0–36.0)
MCV: 84.8 fL (ref 80.0–100.0)
Platelets: 154 10*3/uL (ref 150–440)
RBC: 4.72 MIL/uL (ref 4.40–5.90)
RDW: 14.4 % (ref 11.5–14.5)
WBC: 11.5 10*3/uL — ABNORMAL HIGH (ref 3.8–10.6)

## 2016-06-22 LAB — URINALYSIS, COMPLETE (UACMP) WITH MICROSCOPIC
Bacteria, UA: NONE SEEN
Bilirubin Urine: NEGATIVE
Glucose, UA: NEGATIVE mg/dL
Ketones, ur: 20 mg/dL — AB
Leukocytes, UA: NEGATIVE
Nitrite: NEGATIVE
Protein, ur: 100 mg/dL — AB
Specific Gravity, Urine: 1.031 — ABNORMAL HIGH (ref 1.005–1.030)
pH: 5 (ref 5.0–8.0)

## 2016-06-22 LAB — COMPREHENSIVE METABOLIC PANEL
ALT: 20 U/L (ref 17–63)
AST: 28 U/L (ref 15–41)
Albumin: 4.1 g/dL (ref 3.5–5.0)
Alkaline Phosphatase: 81 U/L (ref 38–126)
Anion gap: 11 (ref 5–15)
BUN: 24 mg/dL — ABNORMAL HIGH (ref 6–20)
CO2: 24 mmol/L (ref 22–32)
Calcium: 9.2 mg/dL (ref 8.9–10.3)
Chloride: 97 mmol/L — ABNORMAL LOW (ref 101–111)
Creatinine, Ser: 1.18 mg/dL (ref 0.61–1.24)
GFR calc Af Amer: >60 mL/min (ref >60)
GFR calc non Af Amer: 53 mL/min — ABNORMAL LOW (ref >60)
Glucose, Bld: 162 mg/dL — ABNORMAL HIGH (ref 65–99)
Potassium: 4.3 mmol/L (ref 3.5–5.1)
Sodium: 132 mmol/L — ABNORMAL LOW (ref 135–145)
Total Bilirubin: 1.7 mg/dL — ABNORMAL HIGH (ref 0.3–1.2)
Total Protein: 8.1 g/dL (ref 6.5–8.1)

## 2016-06-22 LAB — LIPASE, BLOOD: Lipase: 42 U/L (ref 11–51)

## 2016-06-22 LAB — ABO/RH: ABO/RH(D): O NEG

## 2016-06-22 LAB — APTT: aPTT: 27 seconds (ref 24–36)

## 2016-06-22 LAB — PROTIME-INR
INR: 1.16
Prothrombin Time: 14.9 seconds (ref 11.4–15.2)

## 2016-06-22 LAB — PREPARE RBC (CROSSMATCH)

## 2016-06-22 SURGERY — ABDOMINAL AORTOGRAM
Anesthesia: General

## 2016-06-22 SURGERY — ENDOVASCULAR STENT GRAFT (AAA)
Anesthesia: Moderate Sedation | Laterality: Bilateral

## 2016-06-22 MED ORDER — SUCCINYLCHOLINE CHLORIDE 20 MG/ML IJ SOLN
INTRAMUSCULAR | Status: DC | PRN
  Administered 2016-06-22: 140 mg via INTRAVENOUS

## 2016-06-22 MED ORDER — SODIUM CHLORIDE 0.9 % IV BOLUS (SEPSIS)
1000.0000 mL | Freq: Once | INTRAVENOUS | Status: AC
  Administered 2016-06-22: 1000 mL via INTRAVENOUS

## 2016-06-22 MED ORDER — ONDANSETRON HCL 4 MG/2ML IJ SOLN
4.0000 mg | Freq: Once | INTRAMUSCULAR | Status: AC
  Administered 2016-06-22: 4 mg via INTRAVENOUS
  Filled 2016-06-22: qty 2

## 2016-06-22 MED ORDER — LACTATED RINGERS IV SOLN
INTRAVENOUS | Status: DC | PRN
  Administered 2016-06-22: 23:00:00 via INTRAVENOUS

## 2016-06-22 MED ORDER — KETOROLAC TROMETHAMINE 30 MG/ML IJ SOLN
30.0000 mg | Freq: Once | INTRAMUSCULAR | Status: AC
  Administered 2016-06-22: 30 mg via INTRAVENOUS
  Filled 2016-06-22: qty 1

## 2016-06-22 MED ORDER — ESMOLOL HCL-SODIUM CHLORIDE 2000 MG/100ML IV SOLN
25.0000 ug/kg/min | Freq: Once | INTRAVENOUS | Status: DC
  Filled 2016-06-22: qty 100

## 2016-06-22 MED ORDER — CLINDAMYCIN PHOSPHATE 300 MG/50ML IV SOLN
300.0000 mg | INTRAVENOUS | Status: DC
  Filled 2016-06-22: qty 50

## 2016-06-22 MED ORDER — PHENYLEPHRINE HCL 10 MG/ML IJ SOLN
INTRAMUSCULAR | Status: DC | PRN
  Administered 2016-06-22: 100 ug via INTRAVENOUS
  Administered 2016-06-23: 40 ug via INTRAVENOUS
  Administered 2016-06-23: 80 ug via INTRAVENOUS

## 2016-06-22 MED ORDER — ESMOLOL HCL-SODIUM CHLORIDE 2000 MG/100ML IV SOLN: 25.0000 ug/kg/min | INTRAVENOUS | Status: DC

## 2016-06-22 MED ORDER — FENTANYL CITRATE (PF) 250 MCG/5ML IJ SOLN
INTRAMUSCULAR | Status: AC
  Filled 2016-06-22: qty 5

## 2016-06-22 MED ORDER — ESMOLOL HCL 2500 MG/250ML IV SOLN: 25.0000 ug/kg/min | INTRAVENOUS | Status: DC

## 2016-06-22 MED ORDER — SODIUM CHLORIDE 0.9 % IV SOLN
Freq: Once | INTRAVENOUS | Status: AC
  Administered 2016-06-22 – 2016-06-23 (×2): via INTRAVENOUS

## 2016-06-22 MED ORDER — ETOMIDATE 2 MG/ML IV SOLN
INTRAVENOUS | Status: DC | PRN
  Administered 2016-06-22: 20 mg via INTRAVENOUS

## 2016-06-22 MED ORDER — FENTANYL CITRATE (PF) 100 MCG/2ML IJ SOLN
INTRAMUSCULAR | Status: DC | PRN
  Administered 2016-06-22 (×2): 50 ug via INTRAVENOUS

## 2016-06-22 MED ORDER — ESMOLOL HCL-SODIUM CHLORIDE 2500 MG/250ML IV SOLN
25.0000 ug/kg/min | INTRAVENOUS | Status: DC
  Administered 2016-06-22: 25 ug/kg/min via INTRAVENOUS
  Filled 2016-06-22: qty 250

## 2016-06-22 MED ORDER — ESMOLOL HCL 100 MG/10ML IV SOLN
INTRAVENOUS | Status: DC | PRN
  Administered 2016-06-22 (×3): 10 mg via INTRAVENOUS
  Administered 2016-06-23: 5 mg via INTRAVENOUS

## 2016-06-22 MED ORDER — HEPARIN SODIUM (PORCINE) 1000 UNIT/ML IJ SOLN
INTRAMUSCULAR | Status: DC | PRN
  Administered 2016-06-22: 4000 [IU] via INTRAVENOUS

## 2016-06-22 MED ORDER — PROPOFOL 10 MG/ML IV BOLUS
INTRAVENOUS | Status: AC
  Filled 2016-06-22: qty 20

## 2016-06-22 SURGICAL SUPPLY — 42 items
ADH SKN CLS APL DERMABOND .7 (GAUZE/BANDAGES/DRESSINGS) ×1
BALLN ARMADA 12X40X80 (BALLOONS) ×4
BALLN ULTRVRSE 7X60X75C (BALLOONS) ×2
BALLOON ARMADA 12X40X80 (BALLOONS) IMPLANT
BALLOON ULTRVRSE 7X60X75C (BALLOONS) IMPLANT
CATH ACCU-VU SIZ PIG 5F 70CM (CATHETERS) ×1 IMPLANT
CATH BALLN CODA 9X100X32 (BALLOONS) ×2 IMPLANT
CATH BEACON 5.038 65CM KMP-01 (CATHETERS) ×1 IMPLANT
COIL NESTER 14X14 (Vascular Products) ×3 IMPLANT
DERMABOND ADVANCED (GAUZE/BANDAGES/DRESSINGS) ×1
DERMABOND ADVANCED .7 DNX12 (GAUZE/BANDAGES/DRESSINGS) IMPLANT
DEVICE CLOSURE PERCLS PRGLD 6F (VASCULAR PRODUCTS) IMPLANT
DEVICE PRESTO INFLATION (MISCELLANEOUS) ×5 IMPLANT
DEVICE SAFEGUARD 24CM (GAUZE/BANDAGES/DRESSINGS) ×2 IMPLANT
DEVICE TORQUE (MISCELLANEOUS) ×1 IMPLANT
DRYSEAL FLEXSHEATH 12FR 33CM (SHEATH) ×1
DRYSEAL FLEXSHEATH 16FR 33CM (SHEATH) ×1
DRYSEAL FLEXSHEATH 18FR 33CM (SHEATH) ×1
EXCLUDER TNK LEG 28MX12X12 (Endovascular Graft) IMPLANT
EXCLUDER TRUNK LEG 28MX12X12 (Endovascular Graft) ×2 IMPLANT
GLIDEWIRE STIFF .35X180X3 HYDR (WIRE) ×1 IMPLANT
GRAFT EXCLUDER AORTIC 28X3.3CM (Endovascular Graft) ×1 IMPLANT
GRAFT EXCLUDER LEG 14.5X14 (Endovascular Graft) ×1 IMPLANT
LEG CONTRALATERAL 16X16X13.5 (Endovascular Graft) ×2 IMPLANT
LEG CONTRALATERAL 16X20X9.5 (Endovascular Graft) ×4 IMPLANT
NDL ENTRY 21GA 7CM ECHOTIP (NEEDLE) IMPLANT
NEEDLE ENTRY 21GA 7CM ECHOTIP (NEEDLE) ×2 IMPLANT
PENCIL ELECTRO HAND CTR (MISCELLANEOUS) ×1 IMPLANT
PERCLOSE PROGLIDE 6F (VASCULAR PRODUCTS) ×10
SET INTRO CAPELLA COAXIAL (SET/KITS/TRAYS/PACK) ×1 IMPLANT
SHEATH 9FRX11 (SHEATH) ×2 IMPLANT
SHEATH BRITE TIP 6FRX11 (SHEATH) ×2 IMPLANT
SHEATH DRYSEAL FLEX 12FR 33CM (SHEATH) IMPLANT
SHEATH DRYSEAL FLEX 16FR 33CM (SHEATH) IMPLANT
SHEATH DRYSEAL FLEX 18FR 33CM (SHEATH) IMPLANT
STENT GRAFT CONTRALAT 16X13.5 (Endovascular Graft) IMPLANT
STENT GRAFT CONTRALAT 16X20X9. (Endovascular Graft) IMPLANT
SUT MNCRL AB 4-0 PS2 18 (SUTURE) ×1 IMPLANT
SYR MEDRAD MARK V 150ML (SYRINGE) ×1 IMPLANT
TUBING CONTRAST HIGH PRESS 72 (TUBING) ×1 IMPLANT
WIRE AMPLATZ SSTIFF .035X260CM (WIRE) ×2 IMPLANT
WIRE J 3MM .035X145CM (WIRE) ×2 IMPLANT

## 2016-06-22 NOTE — ED Triage Notes (Signed)
States woke at night with pain, states is lower L abdomen, testicle and radiating into back. Does have history of kidney stone in past x 1.

## 2016-06-22 NOTE — Anesthesia Preprocedure Evaluation (Signed)
Anesthesia Evaluation  Patient identified by MRN, date of birth, ID band Patient awake    Reviewed: Allergy & Precautions, H&P , NPO status , Patient's Chart, lab work & pertinent test results  History of Anesthesia Complications Negative for: history of anesthetic complications  Airway Mallampati: III  TM Distance: <3 FB Neck ROM: limited    Dental  (+) Poor Dentition, Chipped, Caps   Pulmonary shortness of breath and with exertion, asthma , former smoker,    Pulmonary exam normal breath sounds clear to auscultation       Cardiovascular Exercise Tolerance: Good hypertension, (-) angina+ CAD  (-) Past MI Normal cardiovascular exam+ dysrhythmias + Valvular Problems/Murmurs  Rhythm:regular Rate:Normal     Neuro/Psych negative neurological ROS  negative psych ROS   GI/Hepatic negative GI ROS, Neg liver ROS,   Endo/Other  Hypothyroidism   Renal/GU Renal disease     Musculoskeletal  (+) Arthritis ,   Abdominal   Peds  Hematology negative hematology ROS (+)   Anesthesia Other Findings Past Medical History: No date: Allergic rhinitis     Comment: ,RAD wheezing No date: Cataracts, bilateral No date: Diverticulosis     Comment: , ACBE, flex, in 2003 No date: Dyslipidemia No date: Fracture of left clavicle     Comment: , Hx No date: Hearing loss     Comment: , Bilateral No date: Hypothyroidism No date: Insomnia No date: Kidney stone 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) No date: Osteoarthritis No date: Pelvic fracture (HCC)     Comment: , Hx No date: RBBB (right bundle branch block)     Comment: , seen on EKG No date: Varicose veins  Past Surgical History: 2011: AORTIC VALVE REPLACEMENT 02/17/2016: CATARACT EXTRACTION W/PHACO Right     Comment: Procedure: CATARACT EXTRACTION PHACO AND               INTRAOCULAR LENS PLACEMENT ([place]);  Surgeon:               Ronnell Freshwater, MD;  Location: [facility];  Service: Ophthalmology;                Laterality: Right;  RIGHT No date: INGUINAL HERNIA REPAIR Bilateral No date: Orchectomy Right No date: THYROIDECTOMY  BMI    Body Mass Index:  23.62 kg/m      Reproductive/Obstetrics negative OB ROS                             Anesthesia Physical Anesthesia Plan  ASA: V and emergent  Anesthesia Plan: General ETT   Post-op Pain Management:    Induction:   Airway Management Planned:   Additional Equipment:   Intra-op Plan:   Post-operative Plan:   Informed Consent: I have reviewed the patients History and Physical, chart, labs and discussed the procedure including the risks, benefits and alternatives for the proposed anesthesia with the patient or authorized representative who has indicated his/her understanding and acceptance.   Dental Advisory Given  Plan Discussed with: Anesthesiologist, CRNA and Surgeon  Anesthesia Plan Comments: (Patient informed that they are higher risk for complications from anesthesia during this procedure due to their medical history.  Patient voiced understanding. )        Anesthesia Quick Evaluation

## 2016-06-22 NOTE — Progress Notes (Signed)
Chaplain responded to a page for a Pt in [place]. CH met with Pt, wife, and daughters at the bedside. Pt's daughter stated that Pt had CT scan, which showed he had stomach aneurism and needed to have surgery urgently. Pt's wife and daughter were visibly anxious and needed someone to comfort them. [place] prayed with the Pt and family in the ED. Pt was taken in for the surgery. [place] escorted the family to the ICU family waiting room where Pt's son joined them. A few minutes later, the Pt's pastor arrived. Chaplain provided prayers support, ice, updates, and ministry of presence. After the surgery, Pt will be moved to ICU.    06/22/16 2300  Clinical Encounter Type  Visited With Patient;Patient and family together  Visit Type Initial;Follow-up;Trauma  Referral From Nurse  Consult/Referral To Chaplain  Spiritual Encounters  Spiritual Needs Prayer;Other (Comment)

## 2016-06-22 NOTE — H&P (Signed)
[facility] Admission History & Physical  MRN : [id number]  Jay [name] is a 83 y.o. (09/15/1925) male who presents with chief complaint of  Chief Complaint  Patient presents with  . Abdominal Pain  .  History of Present Illness: The patient presents to the ER here at [facility] with the complaint of left flank pain.  CT scan shows a ruptured abdominal aortic aneurysm. The patient notes he has been having pain for >12 hours.  He has not lost consciousness.  No hypotension.  No family history of AAA.   Patient denies amaurosis fugax or TIA symptoms. There is no history of claudication or rest pain symptoms of the lower extremities.  The patient denies angina or shortness of breath.  CT scan shows an AAA that measures 7.50 cm ruptured  Current Facility-Administered Medications  Medication Dose Route Frequency Provider Last Rate Last Dose  . [START ON 06/23/2016] clindamycin (CLEOCIN) IVPB 300 mg  300 mg Intravenous On Call to [place], MD      . esmolol (BREVIBLOC) infusion 10 mg/mL  25-300 mcg/kg/min Intravenous Titrated Eula Listen, MD 10.9 mL/hr at 06/22/16 2233 25 mcg/kg/min at 06/22/16 2233   Current Outpatient Prescriptions  Medication Sig Dispense Refill  . albuterol (PROVENTIL HFA;VENTOLIN HFA) 108 (90 BASE) MCG/ACT inhaler Inhale 2 puffs into the lungs every 6 (six) hours as needed for wheezing or shortness of breath.    [name] aspirin 81 MG tablet Take 81 mg by mouth daily.    [name] atorvastatin (LIPITOR) 10 MG tablet Take 10 mg by mouth daily.    . cetirizine (ZYRTEC) 10 MG tablet Take 10 mg by mouth as needed for allergies.     [name] levothyroxine (SYNTHROID, LEVOTHROID) 88 MCG tablet Take 88 mcg by mouth daily before breakfast.    . mometasone-formoterol (DULERA) 100-5 MCG/ACT AERO Inhale 2 puffs into the lungs as needed.     . Omega-3 300 MG CAPS Take 900 mg by mouth daily.    . saw palmetto 500 MG capsule Take 500 mg by mouth daily.       Past Medical History:  Diagnosis Date  . Allergic rhinitis    ,RAD wheezing  . Cataracts, bilateral   . Diverticulosis    , ACBE, flex, in 2003  . Dyslipidemia   . Fracture of left clavicle    , Hx  . Hearing loss    , Bilateral  . Hypothyroidism   . Insomnia   . Kidney stone   . OA (optic atrophy)   . Osteoarthritis   . Pelvic fracture (HCC)    , Hx  . RBBB (right bundle branch block)    , seen on EKG  . Varicose veins     Past Surgical History:  Procedure Laterality Date  . AORTIC VALVE REPLACEMENT  2011  . CATARACT EXTRACTION W/PHACO Right 02/17/2016   Procedure: CATARACT EXTRACTION PHACO AND INTRAOCULAR LENS PLACEMENT ([facility]);  Surgeon: Ronnell Freshwater, MD;  Location: [facility];  Service: Ophthalmology;  Laterality: Right;  RIGHT  . INGUINAL HERNIA REPAIR Bilateral   . Orchectomy Right   . THYROIDECTOMY      Social History Social History  Substance Use Topics  . Smoking status: Former Smoker    Quit date: 03/30/1965  . Smokeless tobacco: Current User    Types: Chew  . Alcohol use No     Comment: Rarely    Family History Family History  Problem Relation Age of Onset  .  Diabetes Mellitus II Mother   No family history of bleeding/clotting disorders, porphyria or autoimmune disease   Allergies  Allergen Reactions  . Aleve [Naproxen Sodium] Hives  . Codeine Other (See Comments)    unknown  . Penicillin G Benzathine     Pt doesn't remember reaction     REVIEW OF SYSTEMS (Negative unless checked)  Constitutional: [] Weight loss  [] Fever  [] Chills Cardiac: [] Chest pain   [] Chest pressure   [] Palpitations   [] Shortness of breath when laying flat   [] Shortness of breath at rest   [x] Shortness of breath with exertion. Vascular:  [] Pain in legs with walking   [] Pain in legs at rest   [] Pain in legs when laying flat   [] Claudication   [] Pain in feet when walking  [] Pain in feet at rest  [] Pain in feet when laying flat   [] History of DVT    [] Phlebitis   [] Swelling in legs   [] Varicose veins   [] Non-healing ulcers Pulmonary:   [] Uses home oxygen   [] Productive cough   [] Hemoptysis   [] Wheeze  [] COPD   [] Asthma Neurologic:  [] Dizziness  [] Blackouts   [] Seizures   [] History of stroke   [] History of TIA  [] Aphasia   [] Temporary blindness   [] Dysphagia   [] Weakness or numbness in arms   [] Weakness or numbness in legs Musculoskeletal:  [] Arthritis   [] Joint swelling   [] Joint pain   [] Low back pain Hematologic:  [] Easy bruising  [] Easy bleeding   [] Hypercoagulable state   [] Anemic  [] Hepatitis Gastrointestinal:  [] Blood in stool   [] Vomiting blood  [] Gastroesophageal reflux/heartburn   [] Difficulty swallowing. Genitourinary:  [] Chronic kidney disease   [x] Difficult urination  [] Frequent urination  [] Burning with urination   [x] Blood in urine Skin:  [] Rashes   [] Ulcers   [] Wounds Psychological:  [] History of anxiety   []  History of major depression.  Physical Examination  Vitals:   06/22/16 1815 06/22/16 1817 06/22/16 2232  BP: (!) 193/104    Pulse: 77    Resp: 20    Temp: 97.5 F (36.4 C)    TempSrc: Oral    SpO2: 99%    Weight:  72.6 kg (160 lb) 68.4 kg (150 lb 12.8 oz)  Height:  5\' 7"  (1.702 m)    Body mass index is 23.62 kg/m. Gen: WD/WN, NAD Head: [place]/AT, No temporalis wasting. Prominent temp pulse not noted. Ear/Nose/Throat: Hearing grossly intact, nares w/o erythema or drainage, oropharynx w/o Erythema/Exudate,  Eyes: Conjunctiva clear, sclera non-icteric Neck: Trachea midline.  No JVD.  Pulmonary:  Good air movement, respirations not labored, no use of accessory muscles.  Cardiac: RRR, normal S1, S2. Vascular: Pulsatile abdominal mass moderate tenderness Vessel Right Left  Radial Palpable Palpable  Ulnar Palpable Palpable  Brachial Palpable Palpable  Carotid Palpable, without bruit Palpable, without bruit  Aorta Easily palpable N/A  Femoral Palpable Palpable  Popliteal Palpable Palpable  PT 2+ Palpable 2+  Palpable  DP Trace Palpable Trace Palpable   Gastrointestinal: soft, non-tender/non-distended. No guarding/reflex.  Musculoskeletal: M/S 5/5 throughout.  Extremities without ischemic changes.  No deformity or atrophy.  Neurologic: Sensation grossly intact in extremities.  Symmetrical.  Speech is fluent. Motor exam as listed above. Psychiatric: Judgment intact, Mood & affect appropriate for pt's clinical situation. Dermatologic: No rashes or ulcers noted.  No cellulitis or open wounds. Lymph : No Cervical, Axillary, or Inguinal lymphadenopathy.     CBC Lab Results  Component Value Date   WBC 11.5 (H) 06/22/2016   HGB 13.4  06/22/2016   HCT 40.0 06/22/2016   MCV 84.8 06/22/2016   PLT 154 06/22/2016    BMET    Component Value Date/Time   NA 132 (L) 06/22/2016 1833   K 4.3 06/22/2016 1833   CL 97 (L) 06/22/2016 1833   CO2 24 06/22/2016 1833   GLUCOSE 162 (H) 06/22/2016 1833   BUN 24 (H) 06/22/2016 1833   CREATININE 1.18 06/22/2016 1833   CALCIUM 9.2 06/22/2016 1833   GFRNONAA 53 (L) 06/22/2016 1833   GFRAA >60 06/22/2016 1833   Estimated Creatinine Clearance: 40.5 mL/min (by C-G formula based on SCr of 1.18 mg/dL).  COAG Lab Results  Component Value Date   INR 1.16 06/22/2016   INR 1.12 03/30/2016   INR 1.84 (H) 10/31/2009    Radiology Ct Renal Stone Study  Result Date: 06/22/2016 CLINICAL DATA:  Left lower abdominal pain. EXAM: CT ABDOMEN AND PELVIS WITHOUT CONTRAST TECHNIQUE: Multidetector CT imaging of the abdomen and pelvis was performed following the standard protocol without IV contrast. COMPARISON:  None. FINDINGS: Lower chest: No pleural fluid. Coronary artery calcifications. Subpleural reticulation in the lung bases, likely chronic. Moderate hiatal hernia with esophageal wall thickening. Hepatobiliary: Calcified gallstones within physiologically distended gallbladder. No biliary dilatation. No focal hepatic lesion allowing for lack contrast. Relative left lobe  hepatic atrophy. Pancreas: Parenchymal atrophy. No ductal dilatation or inflammation. Spleen: Normal in size without focal abnormality. Adrenals/Urinary Tract: Punctate left adrenal calcification. No adrenal nodule. No hydronephrosis. Thinning of both renal parenchyma. Punctate nonobstructing stones in the upper left and lower right kidney. No ureteral stone. Urinary bladder is minimally distended, with a left lateral bladder diverticulum. Stomach/Bowel: Moderate hiatal hernia with distal esophageal wall thickening. Duodenum is draped over abdominal aortic aneurysm with a and probable duodenum diverticulum. No small bowel inflammation or distention. Normal appendix. Moderate multifocal colonic diverticulosis throughout the entire colon, no acute inflammation. Vascular/Lymphatic: Large infrarenal abdominal aortic aneurysm, 7.4 cm in greatest dimension. Evidence of rupture/hemorrhage with peripheral calcification discontinuity and soft tissue stranding about the left lateral aspect. The aneurysm originates approximately 4.2 cm inferior to the right renal artery and 3.5 cm inferior to the left renal artery origin. Inferior extent approximately 2.3 cm proximal to the iliac bifurcation. Moderate diffuse atherosclerotic calcification of the intra-abdominal vasculature. Right common iliac artery measures 1.8 cm, left common iliac artery measures 1.5 cm. Retroperitoneal stranding extends to the inguinal canal on the left. No gross intra-abdominal or pelvic adenopathy. Reproductive: Irregularly-shaped prostate gland causes mass effect on the bladder base. Other: No free air. Post left inguinal hernia repair with surgical clips, minimal herniation of fat. Musculoskeletal: There are no acute or suspicious osseous abnormalities. Degenerative change in the spine. Remote bilateral pubic rami fractures. IMPRESSION: 1. Ruptured 7.4 cm abdominal aortic aneurysm. 2. Non acute findings include cholelithiasis, multifocal colonic  diverticulosis, bilateral nonobstructing nephrolithiasis, and moderate hiatal hernia with esophageal wall thickening. Critical Value/emergent results were called by telephone at the time of interpretation on 06/22/2016 at 9:57 pm to Dr. Eula Listen , who verbally acknowledged these results. Electronically Signed   By: Jeb Levering M.D.   On: 06/22/2016 22:07     Assessment/Plan 1. Ruptured abdominal aortic aneurysm: Patient will require emergent repair. Evaluation of the CT scan by myself shows a anatomy that is acceptable for endovascular stent graft placement. We will move forward with endovascular repair emergently. The risks and benefits of been reviewed with the patient and family all questions of been answered alternative therapies have been discussed. All  are in agreement with aneurysm repair. 2. Aortic valve replacement: Patient is status post bovine valve replacement. He is no longer on anticoagulation as his valve is well-healed. We will continue to monitor his cardiac status continually through his procedure. No contraindication to aneurysm repair. 3. Hypertension: Patient's hypertension will be managed with parenteral medications. 4. Hypothyroidism: Patient will be continued on Synthroid postoperatively   Hortencia Pilar, MD  06/22/2016 10:57 PM

## 2016-06-22 NOTE — ED Notes (Signed)
Pt ambulatory from bathroom in subwait; no difficulty or distress noted; pt ambulated to room 19 accomp by daughter; pt reports having left flank pain radiating into testicle; st hx of kidney stones & feels same; pt instr to dress into hosp gown & warm blankets provided

## 2016-06-22 NOTE — ED Provider Notes (Addendum)
[facility] Emergency Department Provider Note  ____________________________________________  Time seen: Approximately 9:08 PM  I have reviewed the triage vital signs and the nursing notes.   HISTORY  Chief Complaint Abdominal Pain    HPI ALDON HENGST is a 83 y.o. male with a history of renal colic presenting with left flank pain radiating to the left lower quadrant and into the left testicle. The symptoms started yesterday. The patient has not had any dysuria, urinary frequency or hematuria. He has had nausea without vomiting. No fevers or chills.   Past Medical History:  Diagnosis Date  . Allergic rhinitis    ,RAD wheezing  . Cataracts, bilateral   . Diverticulosis    , ACBE, flex, in 2003  . Dyslipidemia   . Fracture of left clavicle    , Hx  . Hearing loss    , Bilateral  . Hypothyroidism   . Insomnia   . Kidney stone   . OA (optic atrophy)   . Osteoarthritis   . Pelvic fracture (HCC)    , Hx  . RBBB (right bundle branch block)    , seen on EKG  . Varicose veins     Patient Active Problem List   Diagnosis Date Noted  . Chest pain 03/30/2016  . Essential hypertension 03/30/2016  . CAD (coronary artery disease), native coronary artery 03/30/2016  . Dizziness 03/30/2016  . H/O aortic valve replacement 10/03/2013  . Hypothyroidism   . Dyslipidemia   . RBBB (right bundle branch block)     Past Surgical History:  Procedure Laterality Date  . AORTIC VALVE REPLACEMENT  2011  . CATARACT EXTRACTION W/PHACO Right 02/17/2016   Procedure: CATARACT EXTRACTION PHACO AND INTRAOCULAR LENS PLACEMENT ([facility]);  Surgeon: Ronnell Freshwater, MD;  Location: [place];  Service: Ophthalmology;  Laterality: Right;  RIGHT  . INGUINAL HERNIA REPAIR Bilateral   . Orchectomy Right   . THYROIDECTOMY      Current Outpatient Rx  . Order #: [id number] Class: Historical Med  . Order #: [id number] Class: Historical Med  . Order #: [id number] Class:  Historical Med  . Order #: [id number] Class: Historical Med  . Order #: [id number] Class: Historical Med  . Order #: [id number] Class: Historical Med  . Order #: [id number] Class: Historical Med  . Order #: [id number] Class: Historical Med    Allergies Aleve [naproxen sodium]; Codeine; and Penicillin g benzathine  Family History  Problem Relation Age of Onset  . Diabetes Mellitus II Mother     Social History Social History  Substance Use Topics  . Smoking status: Former Smoker    Quit date: 03/30/1965  . Smokeless tobacco: Current User    Types: Chew  . Alcohol use No     Comment: Rarely    Review of Systems Constitutional: No fever/chills. Eyes: No visual changes. ENT: No sore throat. No congestion or rhinorrhea. Cardiovascular: Denies chest pain. Denies palpitations. Respiratory: Denies shortness of breath.  No cough. Gastrointestinal: Positive left flank and left lower quadrant abdominal pain.  No nausea, no vomiting.  Positive diarrhea.  No constipation. Genitourinary: Negative for dysuria. No urinary frequency. No hematuria. Musculoskeletal: Negative for back pain. Skin: Negative for rash. Neurological: Negative for headaches. No focal numbness, tingling or weakness.   10-point ROS otherwise negative.  ____________________________________________   PHYSICAL EXAM:  VITAL SIGNS: ED Triage Vitals  Enc Vitals Group     BP 06/22/16 1815 (!) 193/104     Pulse Rate 06/22/16 1815 77     Resp 06/22/16  1815 20     Temp 06/22/16 1815 97.5 F (36.4 C)     Temp Source 06/22/16 1815 Oral     SpO2 06/22/16 1815 99 %     Weight 06/22/16 1817 160 lb (72.6 kg)     Height 06/22/16 1817 5\' 7"  (1.702 m)     Head Circumference --      Peak Flow --      Pain Score 06/22/16 1815 9     Pain Loc --      Pain Edu? --      Excl. in [place]? --     Constitutional: Alert and oriented. Well appearing and in no acute distress. Answers questions appropriately. Eyes: Conjunctivae are normal.  EOMI.  No scleral icterus. Head: Atraumatic. Nose: No congestion/rhinnorhea. Mouth/Throat: Mucous membranes are moist.  Neck: No stridor.  Supple.   Cardiovascular: Normal rate, regular rhythm. No murmurs, rubs or gallops.  Respiratory: Normal respiratory effort.  No accessory muscle use or retractions. Lungs CTAB.  No wheezes, rales or ronchi. Gastrointestinal: Positive left CVA tenderness to palpation. Soft, and nondistended.  Mild tenderness to palpation in the left lower quadrant. No guarding or rebound.  No peritoneal signs. Musculoskeletal: No LE edema. No ttp in the calves or palpable cords.  Negative Homan's sign. Neurologic:  A&Ox3.  Speech is clear.  Face and smile are symmetric.  EOMI.  Moves all extremities well. Skin:  Skin is warm, dry and intact. No rash noted. Psychiatric: Mood and affect are normal. Speech and behavior are normal.  Normal judgement  ____________________________________________   LABS (all labs ordered are listed, but only abnormal results are displayed)  Labs Reviewed  COMPREHENSIVE METABOLIC PANEL - Abnormal; Notable for the following:       Result Value   Sodium 132 (*)    Chloride 97 (*)    Glucose, Bld 162 (*)    BUN 24 (*)    Total Bilirubin 1.7 (*)    GFR calc non Af Amer 53 (*)    All other components within normal limits  CBC - Abnormal; Notable for the following:    WBC 11.5 (*)    All other components within normal limits  URINALYSIS, COMPLETE (UACMP) WITH MICROSCOPIC - Abnormal; Notable for the following:    Color, Urine YELLOW (*)    APPearance HAZY (*)    Specific Gravity, Urine 1.031 (*)    Hgb urine dipstick MODERATE (*)    Ketones, ur 20 (*)    Protein, ur 100 (*)    Squamous Epithelial / LPF 0-5 (*)    All other components within normal limits  LIPASE, BLOOD  PROTIME-INR  APTT  TYPE AND SCREEN   ____________________________________________  EKG  ED ECG REPORT I, Eula Listen, the attending physician, personally  viewed and interpreted this ECG.   Date: 06/22/2016  EKG Time: 2207  Rate: 88  Rhythm: normal sinus rhythm; RBBB  Axis: normal  Intervals:prolonged QTc  ST&T Change: no STEMI  ____________________________________________  RADIOLOGY  No results found.  ____________________________________________   PROCEDURES  Procedure(s) performed: None  Procedures  Critical Care performed: Yes ____________________________________________   INITIAL IMPRESSION / ASSESSMENT AND PLAN / ED COURSE  Pertinent labs & imaging results that were available during my care of the patient were reviewed by me and considered in my medical decision making (see chart for details).  83 y.o. male with a history of renal colic presenting with 2 days of progressively worsening left flank pain radiating to the  left lower quadrant and the left testicle without any urinary symptoms. Overall, the patient's symptoms are most consistent with renal colic. Aortic pathology is possible but much less likely. We will get a CT scan, and initiate symptomatic treatment. At the time of evaluation, the patient has laboratory studies drawn from triage which show mild hyponatremia and hyperglycemia which will be improved with intravenous fluid. He has a white blood cell count of 11.5, and a small number of red blood cells without signs of infection in his urine. His creatinine is reassuring at 1.18.  ----------------------------------------- 10:00 PM on 06/22/2016 -----------------------------------------  The patient has a ruptured AAA measuring 7.4cm.  At this time, he is hypertensive so we will lower his BP.  I have already spoken to Dr. Delana Meyer who will come in to repair the AAA. The patient is aware of his diagnosis at this time.  Two large bore peripheral IV's have been placed.  CRITICAL CARE Performed by: Eula Listen   Total critical care time: 60 minutes  Critical care time was exclusive of separately  billable procedures and treating other patients.  Critical care was necessary to treat or prevent imminent or life-threatening deterioration.  Critical care was time spent personally by me on the following activities: development of treatment plan with patient and/or surrogate as well as nursing, discussions with consultants, evaluation of patient's response to treatment, examination of patient, obtaining history from patient or surrogate, ordering and performing treatments and interventions, ordering and review of laboratory studies, ordering and review of radiographic studies, pulse oximetry and re-evaluation of patient's condition.   ____________________________________________  FINAL CLINICAL IMPRESSION(S) / ED DIAGNOSES  Final diagnoses:  Ruptured abdominal aortic aneurysm (AAA) ([place])         NEW MEDICATIONS STARTED DURING THIS VISIT:  New Prescriptions   No medications on file      Eula Listen, MD 06/22/16 2204    Eula Listen, MD 06/22/16 2214

## 2016-06-22 NOTE — Anesthesia Procedure Notes (Signed)
Procedure Name: Intubation Date/Time: 06/22/2016 11:13 PM Performed by: Andria Frames Pre-anesthesia Checklist: Patient identified, Patient being monitored, Timeout performed, Emergency Drugs available and Suction available Patient Re-evaluated:Patient Re-evaluated prior to inductionOxygen Delivery Method: Circle system utilized Preoxygenation: Pre-oxygenation with 100% oxygen Intubation Type: IV induction Ventilation: Mask ventilation without difficulty Laryngoscope Size: Mac and 3 Grade View: Grade I Tube type: Oral Tube size: 7.0 mm Number of attempts: 1 Airway Equipment and Method: Stylet Placement Confirmation: ETT inserted through vocal cords under direct vision,  positive ETCO2 and breath sounds checked- equal and bilateral Secured at: 21 cm Tube secured with: Tape Dental Injury: Teeth and Oropharynx as per pre-operative assessment

## 2016-06-23 ENCOUNTER — Encounter: Payer: Self-pay | Admitting: Vascular Surgery

## 2016-06-23 DIAGNOSIS — Z961 Presence of intraocular lens: Secondary | ICD-10-CM | POA: Diagnosis present

## 2016-06-23 DIAGNOSIS — Z9841 Cataract extraction status, right eye: Secondary | ICD-10-CM | POA: Diagnosis not present

## 2016-06-23 DIAGNOSIS — E785 Hyperlipidemia, unspecified: Secondary | ICD-10-CM | POA: Diagnosis present

## 2016-06-23 DIAGNOSIS — Z79899 Other long term (current) drug therapy: Secondary | ICD-10-CM | POA: Diagnosis not present

## 2016-06-23 DIAGNOSIS — Z7982 Long term (current) use of aspirin: Secondary | ICD-10-CM | POA: Diagnosis not present

## 2016-06-23 DIAGNOSIS — I713 Abdominal aortic aneurysm, ruptured, unspecified: Secondary | ICD-10-CM | POA: Diagnosis present

## 2016-06-23 DIAGNOSIS — I10 Essential (primary) hypertension: Secondary | ICD-10-CM | POA: Diagnosis present

## 2016-06-23 DIAGNOSIS — Z720 Tobacco use: Secondary | ICD-10-CM | POA: Diagnosis not present

## 2016-06-23 DIAGNOSIS — Z953 Presence of xenogenic heart valve: Secondary | ICD-10-CM | POA: Diagnosis not present

## 2016-06-23 DIAGNOSIS — F05 Delirium due to known physiological condition: Secondary | ICD-10-CM | POA: Diagnosis not present

## 2016-06-23 DIAGNOSIS — I4510 Unspecified right bundle-branch block: Secondary | ICD-10-CM | POA: Diagnosis present

## 2016-06-23 DIAGNOSIS — R109 Unspecified abdominal pain: Secondary | ICD-10-CM | POA: Diagnosis not present

## 2016-06-23 DIAGNOSIS — H4720 Unspecified optic atrophy: Secondary | ICD-10-CM | POA: Diagnosis present

## 2016-06-23 DIAGNOSIS — H9193 Unspecified hearing loss, bilateral: Secondary | ICD-10-CM | POA: Diagnosis present

## 2016-06-23 DIAGNOSIS — E039 Hypothyroidism, unspecified: Secondary | ICD-10-CM | POA: Diagnosis present

## 2016-06-23 LAB — BASIC METABOLIC PANEL
Anion gap: 4 — ABNORMAL LOW (ref 5–15)
BUN: 22 mg/dL — ABNORMAL HIGH (ref 6–20)
CO2: 23 mmol/L (ref 22–32)
Calcium: 7.3 mg/dL — ABNORMAL LOW (ref 8.9–10.3)
Chloride: 109 mmol/L (ref 101–111)
Creatinine, Ser: 1.03 mg/dL (ref 0.61–1.24)
GFR calc Af Amer: >60 mL/min (ref >60)
GFR calc non Af Amer: >60 mL/min (ref >60)
Glucose, Bld: 120 mg/dL — ABNORMAL HIGH (ref 65–99)
Potassium: 3.9 mmol/L (ref 3.5–5.1)
Sodium: 136 mmol/L (ref 135–145)

## 2016-06-23 LAB — MRSA PCR SCREENING: MRSA by PCR: NEGATIVE

## 2016-06-23 LAB — CBC
HCT: 28.6 % — ABNORMAL LOW (ref 40.0–52.0)
Hemoglobin: 9.8 g/dL — ABNORMAL LOW (ref 13.0–18.0)
MCH: 28.9 pg (ref 26.0–34.0)
MCHC: 34.3 g/dL (ref 32.0–36.0)
MCV: 84.3 fL (ref 80.0–100.0)
Platelets: 113 10*3/uL — ABNORMAL LOW (ref 150–440)
RBC: 3.39 MIL/uL — ABNORMAL LOW (ref 4.40–5.90)
RDW: 14.2 % (ref 11.5–14.5)
WBC: 8.2 10*3/uL (ref 3.8–10.6)

## 2016-06-23 MED ORDER — PHENYLEPHRINE 40 MCG/ML (10ML) SYRINGE FOR IV PUSH (FOR BLOOD PRESSURE SUPPORT)
PREFILLED_SYRINGE | INTRAVENOUS | Status: AC
  Filled 2016-06-23: qty 20

## 2016-06-23 MED ORDER — SUGAMMADEX SODIUM 500 MG/5ML IV SOLN
INTRAVENOUS | Status: DC | PRN
  Administered 2016-06-23: 136.8 mg via INTRAVENOUS

## 2016-06-23 MED ORDER — LEVOTHYROXINE SODIUM 88 MCG PO TABS
88.0000 ug | ORAL_TABLET | Freq: Every day | ORAL | Status: DC
  Administered 2016-06-23 – 2016-06-25 (×3): 88 ug via ORAL
  Filled 2016-06-23 (×3): qty 1

## 2016-06-23 MED ORDER — OXYCODONE-ACETAMINOPHEN 5-325 MG PO TABS: 1.0000 | ORAL_TABLET | ORAL | Status: DC | PRN

## 2016-06-23 MED ORDER — SAW PALMETTO (SERENOA REPENS) 500 MG PO CAPS: 500.0000 mg | ORAL_CAPSULE | Freq: Every day | ORAL | Status: DC

## 2016-06-23 MED ORDER — OXYCODONE HCL 5 MG PO TABS: 5.0000 mg | ORAL_TABLET | Freq: Once | ORAL | Status: DC | PRN

## 2016-06-23 MED ORDER — POTASSIUM CHLORIDE CRYS ER 20 MEQ PO TBCR
20.0000 meq | EXTENDED_RELEASE_TABLET | Freq: Every day | ORAL | Status: DC | PRN
Start: 2016-06-23 — End: 2016-06-25

## 2016-06-23 MED ORDER — SODIUM CHLORIDE 0.9 % IV SOLN
INTRAVENOUS | Status: DC | PRN
  Administered 2016-06-23: 25 ug/min via INTRAVENOUS

## 2016-06-23 MED ORDER — SUCCINYLCHOLINE CHLORIDE 20 MG/ML IJ SOLN
INTRAMUSCULAR | Status: AC
  Filled 2016-06-23: qty 1

## 2016-06-23 MED ORDER — SUGAMMADEX SODIUM 200 MG/2ML IV SOLN
INTRAVENOUS | Status: AC
  Filled 2016-06-23: qty 2

## 2016-06-23 MED ORDER — ETOMIDATE 2 MG/ML IV SOLN
INTRAVENOUS | Status: AC
  Filled 2016-06-23: qty 10

## 2016-06-23 MED ORDER — HYDRALAZINE HCL 20 MG/ML IJ SOLN
5.0000 mg | INTRAMUSCULAR | Status: DC | PRN
Start: 2016-06-23 — End: 2016-06-25

## 2016-06-23 MED ORDER — ALUM & MAG HYDROXIDE-SIMETH 200-200-20 MG/5ML PO SUSP: 15.0000 mL | ORAL | Status: DC | PRN

## 2016-06-23 MED ORDER — DOCUSATE SODIUM 100 MG PO CAPS
100.0000 mg | ORAL_CAPSULE | Freq: Every day | ORAL | Status: DC
  Administered 2016-06-24 – 2016-06-25 (×2): 100 mg via ORAL
  Filled 2016-06-23 (×2): qty 1

## 2016-06-23 MED ORDER — NITROGLYCERIN IN D5W 200-5 MCG/ML-% IV SOLN: 5.0000 ug/min | INTRAVENOUS | Status: DC

## 2016-06-23 MED ORDER — DOPAMINE-DEXTROSE 3.2-5 MG/ML-% IV SOLN: 3.0000 ug/kg/min | INTRAVENOUS | Status: DC

## 2016-06-23 MED ORDER — LORATADINE 10 MG PO TABS
10.0000 mg | ORAL_TABLET | Freq: Every day | ORAL | Status: DC
Start: 2016-06-23 — End: 2016-06-25
  Administered 2016-06-25: 10 mg via ORAL
  Filled 2016-06-23 (×2): qty 1

## 2016-06-23 MED ORDER — ONDANSETRON HCL 4 MG/2ML IJ SOLN: 4.0000 mg | Freq: Four times a day (QID) | INTRAMUSCULAR | Status: DC | PRN

## 2016-06-23 MED ORDER — ACETAMINOPHEN 325 MG PO TABS
325.0000 mg | ORAL_TABLET | ORAL | Status: DC | PRN
  Administered 2016-06-24 (×2): 650 mg via ORAL
  Filled 2016-06-23 (×2): qty 2

## 2016-06-23 MED ORDER — ALBUTEROL SULFATE (2.5 MG/3ML) 0.083% IN NEBU: 2.5000 mg | INHALATION_SOLUTION | Freq: Four times a day (QID) | RESPIRATORY_TRACT | Status: DC | PRN

## 2016-06-23 MED ORDER — ACETAMINOPHEN 650 MG RE SUPP: 325.0000 mg | RECTAL | Status: DC | PRN

## 2016-06-23 MED ORDER — OMEGA-3-ACID ETHYL ESTERS 1 G PO CAPS
1.0000 g | ORAL_CAPSULE | Freq: Every day | ORAL | Status: DC
  Administered 2016-06-24 – 2016-06-25 (×2): 1 g via ORAL
  Filled 2016-06-23 (×2): qty 1

## 2016-06-23 MED ORDER — MORPHINE SULFATE (PF) 4 MG/ML IV SOLN: 2.0000 mg | INTRAVENOUS | Status: DC | PRN

## 2016-06-23 MED ORDER — ROCURONIUM BROMIDE 50 MG/5ML IV SOLN
INTRAVENOUS | Status: AC
  Filled 2016-06-23: qty 2

## 2016-06-23 MED ORDER — ROCURONIUM BROMIDE 100 MG/10ML IV SOLN
INTRAVENOUS | Status: DC | PRN
  Administered 2016-06-22 (×2): 30 mg via INTRAVENOUS
  Administered 2016-06-23 (×2): 10 mg via INTRAVENOUS

## 2016-06-23 MED ORDER — MAGNESIUM SULFATE 2 GM/50ML IV SOLN
2.0000 g | Freq: Every day | INTRAVENOUS | Status: DC | PRN
  Filled 2016-06-23: qty 50

## 2016-06-23 MED ORDER — LABETALOL HCL 5 MG/ML IV SOLN
10.0000 mg | INTRAVENOUS | Status: DC | PRN
Start: 2016-06-23 — End: 2016-06-25

## 2016-06-23 MED ORDER — FAMOTIDINE IN NACL 20-0.9 MG/50ML-% IV SOLN
20.0000 mg | Freq: Two times a day (BID) | INTRAVENOUS | Status: DC
  Administered 2016-06-23 (×3): 20 mg via INTRAVENOUS
  Filled 2016-06-23 (×3): qty 50

## 2016-06-23 MED ORDER — SODIUM CHLORIDE 0.9 % IV SOLN: 500.0000 mL | Freq: Once | INTRAVENOUS | Status: DC | PRN

## 2016-06-23 MED ORDER — OXYCODONE HCL 5 MG/5ML PO SOLN: 5.0000 mg | Freq: Once | ORAL | Status: DC | PRN

## 2016-06-23 MED ORDER — EPHEDRINE SULFATE 50 MG/ML IJ SOLN
INTRAMUSCULAR | Status: AC
  Filled 2016-06-23: qty 1

## 2016-06-23 MED ORDER — SODIUM CHLORIDE 0.9 % IV SOLN
INTRAVENOUS | Status: DC
  Administered 2016-06-23: 03:00:00 via INTRAVENOUS

## 2016-06-23 MED ORDER — ASPIRIN EC 81 MG PO TBEC
81.0000 mg | DELAYED_RELEASE_TABLET | Freq: Every day | ORAL | Status: DC
  Administered 2016-06-23 – 2016-06-25 (×3): 81 mg via ORAL
  Filled 2016-06-23 (×3): qty 1

## 2016-06-23 MED ORDER — OMEGA-3 300 MG PO CAPS
900.0000 mg | ORAL_CAPSULE | Freq: Every day | ORAL | Status: DC
  Filled 2016-06-23: qty 3

## 2016-06-23 MED ORDER — ATORVASTATIN CALCIUM 10 MG PO TABS
10.0000 mg | ORAL_TABLET | Freq: Every day | ORAL | Status: DC
  Administered 2016-06-23 – 2016-06-25 (×3): 10 mg via ORAL
  Filled 2016-06-23 (×4): qty 1

## 2016-06-23 MED ORDER — FENTANYL CITRATE (PF) 100 MCG/2ML IJ SOLN: 25.0000 ug | INTRAMUSCULAR | Status: DC | PRN

## 2016-06-23 MED ORDER — METOPROLOL TARTRATE 5 MG/5ML IV SOLN
2.0000 mg | INTRAVENOUS | Status: DC | PRN
Start: 2016-06-23 — End: 2016-06-25

## 2016-06-23 NOTE — Progress Notes (Signed)
S:  Denies any pain what so ever, eating regular diet up in a chair  O:  AF VSS       2+PT pulses       Groins CD&I  A: S/P repair of ruptured AAA  P:  Increase activities monitor labs  Home most likely Thursday

## 2016-06-23 NOTE — Op Note (Signed)
OPERATIVE NOTE   PROCEDURE: 1. US guidance for vascular access, bilateral femoral arteries 2. Catheter placement into aorta from bilateral femoral approaches 3. Placement of a 28 x 12 x 12 Gore Excluder Endoprosthesis main body with a left 16 x 14 contralateral limb 4. Placement of a 20 x 10 right iliac extender limb with a second 20 x 10 deployed in the right common iliac 5. Coil embolization of the right internal iliac artery with 3 14 x 14 coils 6. Placement of a 14 x 14 right external iliac extender 7. Placement of a proximal 28 x 3 extender cuff for the main body 8. ProGlide closure devices bilateral femoral arteries  PRE-OPERATIVE DIAGNOSIS: AAA  POST-OPERATIVE DIAGNOSIS: same  SURGEON: Hortencia Pilar, MD   ANESTHESIA: general  ESTIMATED BLOOD LOSS: 150 cc  FINDING(S): 1.  AAA with ongoing extravasation  SPECIMEN(S):  none  INDICATIONS:   Jay [name] is a 83 y.o. y.o. male who presents with a ruptured abdominal aortic aneurysm.  By CT scan he appeared to be a good candidate for endovascular repair. The risks and benefits for treatment of his ruptured abdominal aortic aortic aneurysm were reviewed in detail with the patient and family all questions were answered. All wished for me to proceed with endovascular repair of his aneurysm  DESCRIPTION: After obtaining full informed written consent, the patient was brought back to the operating room and placed supine upon the operating table.  The patient received IV antibiotics prior to induction.  After obtaining adequate anesthesia, the patient was prepped and draped in the standard fashion for endovascular AAA repair.  We then began by gaining access to both femoral arteries with US guidance with me working on the right.  The femoral arteries were found to be patent and accessed without difficulty with a needle under ultrasound guidance without difficulty on each side and permanent images were recorded.  I then placed 2  proglide devices on each side in a pre-close fashion and placed 8 [place] sheaths.  The patient was then given  4000 units of intravenous heparin.   The Pigtail catheter was placed into the aorta from the  left side. Using this image, we selected a 28 x 12 x 12 Main body device.  Amplatz stiff wire was then advanced through a Kumpe catheter up the right side. Over the stiff wire, an 30 French sheath was placed. The main body was then placed through the 18 French sheath. A Kumpe catheter was placed up the left side and a magnified image at the renal arteries was performed. The main body was then deployed just below the lowest renal artery. The Kumpe catheter was used to cannulate the contralateral gate without difficulty and successful cannulation was confirmed by twirling the pigtail catheter in the main body. We then placed a stiff wire and a retrograde arteriogram was performed through the left femoral sheath. We upsized to the 12 [place] sheath for the contralateral limb and a 16 x 14 limb was selected and deployed. The main body deployment was then completed. Based off the angiographic findings, extension limbs were necessary on the right side.  A 20 x 10 extender limb was then advanced up the right side extending the ipsilateral portion of the main body. All junction points and seals zones were treated with the compliant balloon.   The pigtail catheter was then replaced and a completion angiogram was performed.   No Endoleak was detected on completion angiography. The renal arteries were found  to be widely patent. However persistent extravasation was noted coming from what appeared to be the right common iliac area. Initially I thought this was coming through the graft itself and therefore I re-lined the 20 x 10 extender limb with a second 20 x 10. This did not stop the extravasation and therefore I used a Kumpe catheter and a Glidewire through the right sheath advanced the catheter into the right internal  iliac and deployed 3 14 x 14 coils. I then advanced a 14 x 14 extender limb up the right side and delivered this extending into the external iliac.  Attention was then turned to the proximal portion where the graft appeared to be approximately 15 mm below the renal. Therefore, a proximal extender cuff a 28 x 3 was advanced up the right side unfortunately it would not reach and the sheath would not advance through the external iliac. To solve this I upsized the left femoral sheath from a 12 [place] to a 16 [place] the pigtail catheter was then advanced up the right side stiff wire was used to exchange the catheter and then the extender was advanced up the left side. Magnified image of the renal arteries was then obtained through the pigtail catheter and the extender deployed. Both proximal extender cuff and the right extender limb were then treated with the compliant balloon and a final image was once again obtained. This image demonstrated rapid flow of contrast through the graft no evidence of extravasation no evidence of any endoleak's.   At this point we elected to terminate the procedure. We secured the pro glide devices for hemostasis on the femoral arteries. The skin incision was closed with a 4-0 Monocryl. Dermabond and pressure dressing were placed. The patient was taken to the recovery room in stable condition having tolerated the procedure well.  COMPLICATIONS: none  CONDITION: stable  Hortencia Pilar  06/23/2016, 1:42 AM

## 2016-06-23 NOTE — Progress Notes (Signed)
Pt yelled out c/o pain with catheter and wanting it removed . Pt catheter removed pt will be monitored for further urine output

## 2016-06-23 NOTE — Anesthesia Post-op Follow-up Note (Cosign Needed)
Anesthesia QCDR form completed.        

## 2016-06-23 NOTE — Anesthesia Postprocedure Evaluation (Signed)
Anesthesia Post Note  Patient: Jay [name]  Procedure(s) Performed: Procedure(s) (LRB): Abdominal Aortogram (N/A) Aortic Intervention (N/A)  Patient location during evaluation: PACU Anesthesia Type: General Level of consciousness: awake and alert Pain management: pain level controlled Vital Signs Assessment: post-procedure vital signs reviewed and stable Respiratory status: spontaneous breathing, nonlabored ventilation, respiratory function stable and patient connected to nasal cannula oxygen Cardiovascular status: blood pressure returned to baseline and stable Postop Assessment: no signs of nausea or vomiting Anesthetic complications: no     Last Vitals:  Vitals:   06/23/16 0430 06/23/16 0500  BP: (!) 93/46 (!) 111/59  Pulse: 71 64  Resp: 15 17  Temp:  36.8 C    Last Pain:  Vitals:   06/23/16 0500  TempSrc: Oral  PainSc:                  Precious Haws Hurley Blevins

## 2016-06-23 NOTE — Progress Notes (Signed)
Pt arrived to unit from PACU. Pt sleepy & arousable. Pt denies pain or discomfort 2Lnc noted with no resp.distress. Bil PAD's noted to groin intact level 0. Pt has palpable pulses bil.noted per assessment. Family in to visit pt .call light within reach will cont to monitor.

## 2016-06-23 NOTE — Transfer of Care (Signed)
Immediate Anesthesia Transfer of Care Note  Patient: Jay [name]  Procedure(s) Performed: Procedure(s): Abdominal Aortogram (N/A) Aortic Intervention (N/A)  Patient Location: PACU  Anesthesia Type:General  Level of Consciousness: awake and confused  Airway & Oxygen Therapy: Patient Spontanous Breathing  Post-op Assessment: Report given to RN and Post -op Vital signs reviewed and stable  Post vital signs: Reviewed and stable  Last Vitals:  Vitals:   06/22/16 1815  BP: (!) 193/104  Pulse: 77  Resp: 20  Temp: 36.4 C    Last Pain:  Vitals:   06/22/16 2117  TempSrc:   PainSc: 9          Complications: No apparent anesthesia complications

## 2016-06-23 NOTE — Progress Notes (Signed)
Patient's home medication: Saw palmetto Herbal medication per restricted medication policy will not dispense this medication.  Tobie Lords, PharmD, BCPS Clinical Pharmacist 06/23/2016

## 2016-06-24 ENCOUNTER — Encounter: Payer: Self-pay | Admitting: *Deleted

## 2016-06-24 LAB — TYPE AND SCREEN
ABO/RH(D): O NEG
Antibody Screen: NEGATIVE
Unit division: 0
Unit division: 0
Unit division: 0
Unit division: 0

## 2016-06-24 LAB — BPAM RBC
Blood Product Expiration Date: 201804252359
Blood Product Expiration Date: 201804272359
Blood Product Expiration Date: 201804282359
Blood Product Expiration Date: 201805012359
Unit Type and Rh: 9500
Unit Type and Rh: 9500
Unit Type and Rh: 9500
Unit Type and Rh: 9500

## 2016-06-24 LAB — BASIC METABOLIC PANEL
Anion gap: 5 (ref 5–15)
BUN: 15 mg/dL (ref 6–20)
CO2: 24 mmol/L (ref 22–32)
Calcium: 7.9 mg/dL — ABNORMAL LOW (ref 8.9–10.3)
Chloride: 110 mmol/L (ref 101–111)
Creatinine, Ser: 1 mg/dL (ref 0.61–1.24)
GFR calc Af Amer: >60 mL/min (ref >60)
GFR calc non Af Amer: >60 mL/min (ref >60)
Glucose, Bld: 116 mg/dL — ABNORMAL HIGH (ref 65–99)
Potassium: 3.6 mmol/L (ref 3.5–5.1)
Sodium: 139 mmol/L (ref 135–145)

## 2016-06-24 LAB — CBC
HCT: 28.1 % — ABNORMAL LOW (ref 40.0–52.0)
Hemoglobin: 9.4 g/dL — ABNORMAL LOW (ref 13.0–18.0)
MCH: 27.9 pg (ref 26.0–34.0)
MCHC: 33.4 g/dL (ref 32.0–36.0)
MCV: 83.5 fL (ref 80.0–100.0)
Platelets: 102 10*3/uL — ABNORMAL LOW (ref 150–440)
RBC: 3.37 MIL/uL — ABNORMAL LOW (ref 4.40–5.90)
RDW: 14.5 % (ref 11.5–14.5)
WBC: 7.1 10*3/uL (ref 3.8–10.6)

## 2016-06-24 LAB — GLUCOSE, CAPILLARY: Glucose-Capillary: 126 mg/dL — ABNORMAL HIGH (ref 65–99)

## 2016-06-24 MED ORDER — FAMOTIDINE 20 MG PO TABS
20.0000 mg | ORAL_TABLET | Freq: Two times a day (BID) | ORAL | Status: DC
  Administered 2016-06-24 – 2016-06-25 (×3): 20 mg via ORAL
  Filled 2016-06-24 (×3): qty 1

## 2016-06-24 NOTE — Progress Notes (Addendum)
Chaplain rounding the unit met the Pt's wife in the [facility]. Pt wife asked [place] to visit her husband who was in the [place] with her daughter. [place] visited and met with Pt and daughter at the bedside. Pt told [place] he was doing much better today. 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 Pt was alert and oriented. Pt requested a thanksgiving prayer, which the [facility] provided with the ministry of presence.      06/24/16 1500  Clinical Encounter Type  Visited With Patient;Patient and family together  Visit Type Follow-up;Social support  Referral From Family  Consult/Referral To Chaplain  Spiritual Encounters  Spiritual Needs Prayer;Other (Comment)

## 2016-06-24 NOTE — Care Management (Signed)
Patient presents with flank pain.  Lives at home with wife.  PTA independent.  S/P AAA repair.  PT consult pending.  RNCM following.

## 2016-06-24 NOTE — Evaluation (Signed)
Physical Therapy Evaluation Patient Details Name: Jay [name] MRN: [id number] DOB: [birth year] Today's Date: 06/24/2016   History of Present Illness  pt is a 83 yo maled admitted to [facility] with a ruptured AAA of 7 cm, s/p AAA endovascular repair 06/23/16. PMH includes, HTN, aortic valve replacement, hypothyroid, dyslipidemia, OA, pelvic fx, clacivcle fx, and Insomnia    Clinical Impression  Pt slightly annoyed but willing to participate in PT evaluation, states he has had bad experiences with physical therapy in the past. Patient lives with his wife in a level entry one-story home and does not use any ADs. His overall strength appears WFLs for tasks assessed, and pt is able to perform bed mobility, transfers and ambulation w/o any assistance. He ambulated around the nursing station (200') under PT supervision w/ a good symmetrical gait pattern and demonstrated good safety awareness. Pt appears to be functioning at his baseline level w/ no current PT needs. Will complete orders at this time, please resubmit orders if pt's status changes.     Follow Up Recommendations No PT follow up    Equipment Recommendations  None recommended by PT    Recommendations for Other Services       Precautions / Restrictions Precautions Precautions: None Restrictions Weight Bearing Restrictions: No      Mobility  Bed Mobility Overal bed mobility: Independent             General bed mobility comments: safe effortless technique getting in and out of bed  Transfers Overall transfer level: Independent Equipment used: None             General transfer comment: uses Bilat UE for push off able to trasnfer to standing w/o assistance  Ambulation/Gait Ambulation/Gait assistance: Supervision Ambulation Distance (Feet): 200 Feet Assistive device: None Gait Pattern/deviations: WFL(Within Functional Limits) Gait velocity: velocity is good for community ambulator  Gait velocity interpretation: at  or above normal speed for age/gender General Gait Details: good symmetrical gait pattern, no LOB or staggerring noted, able to maintain safety and perform turns without difficulty  Stairs            Wheelchair Mobility    Modified Rankin (Stroke Patients Only)       Balance Overall balance assessment: Independent                                           Pertinent Vitals/Pain Pain Assessment: No/denies pain    Home Living Family/patient expects to be discharged to:: Private residence Living Arrangements: Spouse/significant other Available Help at Discharge: Family Type of Home: House Home Access: Level entry     Home Layout: One level Home Equipment: None      Prior Function Level of Independence: Independent         Comments: Mows lawn, drives, cares for wife.      Hand Dominance        Extremity/Trunk Assessment   Upper Extremity Assessment Upper Extremity Assessment: Overall WFL for tasks assessed    Lower Extremity Assessment Lower Extremity Assessment: Overall WFL for tasks assessed       Communication   Communication: HOH  Cognition Arousal/Alertness: Awake/alert Behavior During Therapy: WFL for tasks assessed/performed Overall Cognitive Status: Within Functional Limits for tasks assessed  General Comments      Exercises     Assessment/Plan    PT Assessment [place] does not need any further PT services  PT Problem List         PT Treatment Interventions      PT Goals (Current goals can be found in the Care Plan section)  Acute Rehab PT Goals Patient Stated Goal: Return home PT Goal Formulation: With patient/family Time For Goal Achievement: 07/08/16 Potential to Achieve Goals: Good    Frequency     Barriers to discharge        Co-evaluation               End of Session Equipment Utilized During Treatment: Gait belt Activity Tolerance:  Patient tolerated treatment well Patient left: in bed;with call bell/phone within reach;with family/visitor present Nurse Communication: Mobility status      Time: 3838-1840 PT Time Calculation (min) (ACUTE ONLY): 12 min   Charges:         PT G Codes:        [employer] Student PT 06/24/16, 2:36 PM [phone]   Stephane Junkins 06/24/2016, 2:31 PM

## 2016-06-24 NOTE — Progress Notes (Signed)
Report called to [place], Therapist, sports.  Patient will be moved to room 211.

## 2016-06-24 NOTE — Progress Notes (Signed)
Patient alert with no distress.  Less confused this morning and cooperative.   Patient moved to room 211 by wheelchair with Kennyth Lose, [place].  Daughter and wife following to room.  Patient moved on tele.

## 2016-06-24 NOTE — Progress Notes (Signed)
[facility]  Daily Progress Note   Subjective  - 2 Days Post-Op  Doing well but agitated that he is in the hospital.  Some sundowning last night in the ICU.  On the floor now.  No major events.  Objective Vitals:   06/24/16 0700 06/24/16 0800 06/24/16 0843 06/24/16 0947  BP:  (!) 167/80  131/60  Pulse:  75    Resp:  18  (!) 23  Temp:   98.1 F (36.7 C)   TempSrc:   Axillary   SpO2: 91% 99%  95%  Weight:      Height:        Intake/Output Summary (Last 24 hours) at 06/24/16 1829 Last data filed at 06/24/16 1705  Gross per 24 hour  Intake           744.89 ml  Output             1675 ml  Net          -930.11 ml    PULM  CTAB CV  RRR VASC  Feet warm, access sites C/d/I  Laboratory CBC    Component Value Date/Time   WBC 7.1 06/24/2016 0357   HGB 9.4 (L) 06/24/2016 0357   HCT 28.1 (L) 06/24/2016 0357   PLT 102 (L) 06/24/2016 0357    BMET    Component Value Date/Time   NA 139 06/24/2016 0357   K 3.6 06/24/2016 0357   CL 110 06/24/2016 0357   CO2 24 06/24/2016 0357   GLUCOSE 116 (H) 06/24/2016 0357   BUN 15 06/24/2016 0357   CREATININE 1.00 06/24/2016 0357   CALCIUM 7.9 (L) 06/24/2016 0357   GFRNONAA >60 06/24/2016 0357   GFRAA >60 06/24/2016 0357    Assessment/Planning: POD #2 s/p EVAR for ruptured AAA   Doing well  Renal function normal  No signs of bleeding.  Did well with PT.  Probably home tomorrow    Leotis Pain  06/24/2016, 6:29 PM

## 2016-06-24 NOTE — Progress Notes (Signed)
RN paged Dr. Lucky Cowboy and asked about patient being moved to regular floor.  This RN spoke with Linna Hoff, RN because MD is in surgery and Dan relayed message and MD gave order to transfer patient to [facility] with tele.

## 2016-06-25 NOTE — Care Management Important Message (Signed)
Important Message  Patient Details  Name: Jay [name] MRN: [id number] Date of Birth: 1925/12/24   Medicare Important Message Given:  N/A - LOS <3 / Initial given by admissions    Beverly Sessions, RN 06/25/2016, 4:42 PM

## 2016-06-25 NOTE — Discharge Instructions (Signed)
Abdominal Aortic Aneurysm Open Repair, Care After This sheet gives you information about how to care for yourself after your procedure. Your health care provider may also give you more specific instructions. If you have problems or questions, contact your health care provider. What can I expect after the procedure? After the procedure, it is common to have:  Some pain at the incision site.  Tiredness (fatigue). It can take up to 3 months before you are ready to start all of your normal activities. Follow these instructions at home: Activity   Get plenty of rest.  Follow instructions from your health care provider about how much you should move around and how far you should go when you take short walks. Start walking farther when your health care provider says it is okay.  Avoid activities that require a lot of energy for 6-8 weeks or for as long as told by your health care provider.  Do not lift anything that is heavier than 10 lb (4.5 kg) until your health care provider says it is safe.  Return to your normal activities as told by your health care provider. Ask your health care provider what activities are safe for you.  Do not drive until your health care provider says it is okay. Incision care   Follow instructions from your health care provider about how to take care of your incision. Make sure you:  Wash your hands with soap and water before you change your bandage (dressing). If soap and water are not available, use hand sanitizer.  Change your dressing as told by your health care provider.  Leave stitches (sutures), skin glue, or adhesive strips in place. These skin closures may need to stay in place for 2 weeks or longer. If adhesive strip edges start to loosen and curl up, you may trim the loose edges. Do not remove adhesive strips completely unless your health care provider tells you to do that.  Do not take baths, swim, or use a hot tub until your health care provider  approves. Ask your health care provider if you can take showers. You may only be allowed to take sponge baths for bathing.  Check your incision area every day for signs of infection. Check for:  More redness, swelling, or pain.  More fluid or blood.  Warmth.  Pus or a bad smell. Lifestyle    Do not use any products that contain nicotine or tobacco, such as cigarettes and e-cigarettes. If you need help quitting, ask your health care provider.  Make any other lifestyle changes that your health care provider suggests. These may include:  Keeping your blood pressure under control.  Finding ways to lower stress.  Eating healthy foods that are good for your heart, such as vegetables, fruits, and whole grains that add fiber to your diet.  Getting regular exercise. General instructions   Take over-the-counter and prescription medicines only as told by your health care provider.  Drink enough fluid to keep your urine clear or pale yellow.  Keep all follow-up visits as told by your health care provider. This is important. Contact a health care provider if:  You have more redness, swelling, or pain around your incision.  You have more fluid or blood coming from your incision.  Your incision feels warm to the touch.  You have pus or a bad smell coming from your incision.  You have a fever.  You have chills.  You notice that the edges of the incision are not staying  together after the stitches or staples have been taken out.  You have nausea or vomiting that does not go away.  You have a rash. Get help right away if:  You feel dizzy when standing or you faint.  You have trouble breathing. This information is not intended to replace advice given to you by your health care provider. Make sure you discuss any questions you have with your health care provider. Document Released: 10/03/2004 Document Revised: 10/04/2015 Document Reviewed: 06/10/2015 Elsevier Interactive Patient  Education  2017 [employer].

## 2016-06-25 NOTE — Discharge Summary (Signed)
[facility]    Discharge Summary    Patient ID:  Jay [name] MRN: [id number] DOB/AGE: 12-07-1925 83 y.o.  Admit date: 06/22/2016 Discharge date: 06/25/2016 Date of Surgery: 06/22/2016 - 06/23/2016 Surgeon: Juliann Mule): Katha Cabal, MD  Admission Diagnosis: Ruptured abdominal aortic aneurysm (AAA) [facility]) [I71.3]  Discharge Diagnoses:  Ruptured abdominal aortic aneurysm (AAA) ([place]) [I71.3]  Secondary Diagnoses: Past Medical History:  Diagnosis Date  . Allergic rhinitis    ,RAD wheezing  . Cataracts, bilateral   . Diverticulosis    , ACBE, flex, in 2003  . Dyslipidemia   . Fracture of left clavicle    , Hx  . Hearing loss    , Bilateral  . Hypothyroidism   . Insomnia   . Kidney stone   . OA (optic atrophy)   . Osteoarthritis   . Pelvic fracture (HCC)    , Hx  . RBBB (right bundle branch block)    , seen on EKG  . Varicose veins     Procedure(s): Abdominal Aortogram Aortic Intervention  Discharged Condition: good  HPI:  Patient presented to the emergency room with excruciating left flank pain. CT of the abdomen and pelvis demonstrated ruptured abdominal aortic aneurysm. He was taken emergently to the angiography suite where endovascular repair of his abdominal aortic aneurysm was performed. Postoperatively he has an uneventful course. Today he was ambulating around the nurse's station. He's been discharged by physical therapy. He is felt fit for discharge. He is discharged to home.  Hospital Course:  Jay [name] is a 83 y.o. male is S/P Neither Procedure(s): Abdominal Aortogram Aortic Intervention Extubated: POD # 0 Physical exam: Patient has nontender nondistended abdomen with palpable posterior tibial pulses bilaterally Post-op wounds clean, dry, intact or healing well Pt. Ambulating, voiding and taking PO diet without difficulty. Pt pain controlled with PO pain meds. Labs as below Complications:none  Consults:     Significant Diagnostic Studies: CBC Lab Results  Component Value Date   WBC 7.1 06/24/2016   HGB 9.4 (L) 06/24/2016   HCT 28.1 (L) 06/24/2016   MCV 83.5 06/24/2016   PLT 102 (L) 06/24/2016    BMET    Component Value Date/Time   NA 139 06/24/2016 0357   K 3.6 06/24/2016 0357   CL 110 06/24/2016 0357   CO2 24 06/24/2016 0357   GLUCOSE 116 (H) 06/24/2016 0357   BUN 15 06/24/2016 0357   CREATININE 1.00 06/24/2016 0357   CALCIUM 7.9 (L) 06/24/2016 0357   GFRNONAA >60 06/24/2016 0357   GFRAA >60 06/24/2016 0357   COAG Lab Results  Component Value Date   INR 1.16 06/22/2016   INR 1.12 03/30/2016   INR 1.84 (H) 10/31/2009     Disposition:  Discharge to :Home Discharge Instructions    Call MD for:  redness, tenderness, or signs of infection (pain, swelling, bleeding, redness, odor or green/yellow discharge around incision site)    Complete by:  As directed    Call MD for:  severe or increased pain, loss or decreased feeling  in affected limb(s)    Complete by:  As directed    Call MD for:  temperature >100.5    Complete by:  As directed    Discharge instructions    Complete by:  As directed    OK to shower  Encourage ambulation   OK to ride in a car, may begin driving in 1 week   Driving Restrictions    Complete by:  As directed    No driving for 1 weeks   Lifting restrictions    Complete by:  As directed    No lifting for 1 weeks   Resume previous diet    Complete by:  As directed      Allergies as of 06/25/2016      Reactions   Aleve [naproxen Sodium] Hives   Codeine Other (See Comments)   unknown   Penicillin G Benzathine    Pt doesn't remember reaction      Medication List    TAKE these medications   albuterol 108 (90 Base) MCG/ACT inhaler Commonly known as:  PROVENTIL HFA;VENTOLIN HFA Inhale 2 puffs into the lungs every 6 (six) hours as needed for wheezing or shortness of breath.   aspirin 81 MG tablet Take 81 mg by mouth daily.    atorvastatin 10 MG tablet Commonly known as:  LIPITOR Take 10 mg by mouth daily.   cetirizine 10 MG tablet Commonly known as:  ZYRTEC Take 10 mg by mouth as needed for allergies.   levothyroxine 88 MCG tablet Commonly known as:  SYNTHROID, LEVOTHROID Take 88 mcg by mouth daily before breakfast.   mometasone-formoterol 100-5 MCG/ACT Aero Commonly known as:  DULERA Inhale 2 puffs into the lungs as needed.   Omega-3 300 MG Caps Take 900 mg by mouth daily.   saw palmetto 500 MG capsule Take 500 mg by mouth daily.      Verbal and written Discharge instructions given to the patient. Wound care per Discharge AVS Follow-up Information    Hortencia Pilar, MD. Go on 07/20/2016.   Specialties:  Vascular Surgery, Cardiology, Radiology, Vascular Surgery Why:  Go at 4:00pm. Contact information: [place] [address] [phone]           Signed: Hortencia Pilar, MD  06/25/2016, 8:31 PM

## 2016-06-25 NOTE — Progress Notes (Signed)
Patient discharged to home as ordered. Patient had no changes I his medication. Follow up appointment given as ordered. Daughter at the bedside to take patient home. Patient is alert and oriented ambulates well with stand by assistance.

## 2016-06-29 ENCOUNTER — Encounter: Payer: Self-pay | Admitting: Vascular Surgery

## 2016-06-29 MED ORDER — IOPAMIDOL (ISOVUE-300) INJECTION 61%
INTRAVENOUS | Status: DC | PRN
  Administered 2016-06-23: 85 mL via INTRA_ARTERIAL

## 2016-07-16 DIAGNOSIS — I713 Abdominal aortic aneurysm, ruptured: Secondary | ICD-10-CM | POA: Diagnosis not present

## 2016-07-16 DIAGNOSIS — Z9889 Other specified postprocedural states: Secondary | ICD-10-CM | POA: Diagnosis not present

## 2016-07-16 DIAGNOSIS — Z8679 Personal history of other diseases of the circulatory system: Secondary | ICD-10-CM | POA: Diagnosis not present

## 2016-07-20 ENCOUNTER — Encounter (INDEPENDENT_AMBULATORY_CARE_PROVIDER_SITE_OTHER): Payer: Self-pay | Admitting: Vascular Surgery

## 2016-07-20 ENCOUNTER — Ambulatory Visit (INDEPENDENT_AMBULATORY_CARE_PROVIDER_SITE_OTHER): Payer: Medicare Other | Admitting: Vascular Surgery

## 2016-07-20 VITALS — BP 126/64 | HR 68 | Resp 16 | Ht 70.0 in | Wt 164.0 lb

## 2016-07-20 DIAGNOSIS — I2510 Atherosclerotic heart disease of native coronary artery without angina pectoris: Secondary | ICD-10-CM

## 2016-07-20 DIAGNOSIS — I713 Abdominal aortic aneurysm, ruptured, unspecified: Secondary | ICD-10-CM

## 2016-07-20 DIAGNOSIS — E785 Hyperlipidemia, unspecified: Secondary | ICD-10-CM

## 2016-07-20 NOTE — Progress Notes (Signed)
Patient ID: Jay [name], male   DOB: 06-29-1925, 83 y.o.   MRN: [id number]  Chief Complaint  Patient presents with  . Re-evaluation    3 week [facility] folow up    HPI Jay [name] is a 83 y.o. male.  The patient returns to the office for follow up  of a rupture abdominal aortic aneurysm status post stent graft placement on 06/23/2016.   Patient denies abdominal pain or back pain, no other abdominal complaints. No groin related complaints. No symptoms consistent with distal embolization No changes in claudication distance.   There have been no interval changes in his overall healthcare since his last visit.   Patient denies amaurosis fugax or TIA symptoms. There is no history of claudication or rest pain symptoms of the lower extremities. The patient denies angina or shortness of breath.     Past Medical History:  Diagnosis Date  . Allergic rhinitis    ,RAD wheezing  . Cataracts, bilateral   . Diverticulosis    , ACBE, flex, in 2003  . Dyslipidemia   . Fracture of left clavicle    , Hx  . Hearing loss    , Bilateral  . Hypothyroidism   . Insomnia   . Kidney stone   . OA (optic atrophy)   . Osteoarthritis   . Pelvic fracture (HCC)    , Hx  . RBBB (right bundle branch block)    , seen on EKG  . Varicose veins     Past Surgical History:  Procedure Laterality Date  . ABDOMINAL AORTOGRAM N/A 06/22/2016   Procedure: Abdominal Aortogram;  Surgeon: Katha Cabal, MD;  Location: [employer] [facility];  Service: Cardiovascular;  Laterality: N/A;  . AORTIC INTERVENTION N/A 06/22/2016   Procedure: Aortic Intervention;  Surgeon: Katha Cabal, MD;  Location: [place] [facility];  Service: Cardiovascular;  Laterality: N/A;  . AORTIC VALVE REPLACEMENT  2011  . CATARACT EXTRACTION W/PHACO Right 02/17/2016   Procedure: CATARACT EXTRACTION PHACO AND INTRAOCULAR LENS PLACEMENT (IOC);  Surgeon: Ronnell Freshwater, MD;  Location: [place];  Service:  Ophthalmology;  Laterality: Right;  RIGHT  . INGUINAL HERNIA REPAIR Bilateral   . Orchectomy Right   . THYROIDECTOMY        Allergies  Allergen Reactions  . Aleve [Naproxen Sodium] Hives  . Codeine Other (See Comments)    unknown  . Penicillin G Benzathine     Pt doesn't remember reaction    Current Outpatient Prescriptions  Medication Sig Dispense Refill  . albuterol (PROVENTIL HFA;VENTOLIN HFA) 108 (90 BASE) MCG/ACT inhaler Inhale 2 puffs into the lungs every 6 (six) hours as needed for wheezing or shortness of breath.    [name] aspirin 81 MG tablet Take 81 mg by mouth daily.    [name] atorvastatin (LIPITOR) 10 MG tablet Take 10 mg by mouth daily.    . cetirizine (ZYRTEC) 10 MG tablet Take 10 mg by mouth as needed for allergies.     [name] levothyroxine (SYNTHROID, LEVOTHROID) 88 MCG tablet Take 88 mcg by mouth daily before breakfast.    . mometasone-formoterol (DULERA) 100-5 MCG/ACT AERO Inhale 2 puffs into the lungs as needed.     . Omega-3 300 MG CAPS Take 900 mg by mouth daily.    . saw palmetto 500 MG capsule Take 500 mg by mouth daily.     No current facility-administered medications for this visit.         Physical Exam  BP 126/64 (BP Location: Right Arm)   Pulse 68   Resp 16   Ht 5\' 10"  (1.778 m)   Wt 74.4 kg (164 lb)   BMI 23.53 kg/m  Gen:  WD/WN, NAD Skin: incision C/D/I Pulses:  2+ femoral and popliteal pulses bilaterally    Assessment/Plan:  Ruptured AA  He is doing well No changes at this time  Follow up in 3 months with Duplex ultrasound     Hortencia Pilar 07/20/2016, 4:22 PM   This note was created with Dragon medical transcription system.  Any errors from dictation are unintentional.

## 2016-08-11 DIAGNOSIS — Z85828 Personal history of other malignant neoplasm of skin: Secondary | ICD-10-CM | POA: Diagnosis not present

## 2016-08-11 DIAGNOSIS — L821 Other seborrheic keratosis: Secondary | ICD-10-CM | POA: Diagnosis not present

## 2016-08-11 DIAGNOSIS — D485 Neoplasm of uncertain behavior of skin: Secondary | ICD-10-CM | POA: Diagnosis not present

## 2016-08-11 DIAGNOSIS — D1801 Hemangioma of skin and subcutaneous tissue: Secondary | ICD-10-CM | POA: Diagnosis not present

## 2016-08-11 DIAGNOSIS — L814 Other melanin hyperpigmentation: Secondary | ICD-10-CM | POA: Diagnosis not present

## 2016-09-07 DIAGNOSIS — D696 Thrombocytopenia, unspecified: Secondary | ICD-10-CM | POA: Diagnosis not present

## 2016-09-07 DIAGNOSIS — N182 Chronic kidney disease, stage 2 (mild): Secondary | ICD-10-CM | POA: Diagnosis not present

## 2016-09-07 DIAGNOSIS — R7303 Prediabetes: Secondary | ICD-10-CM | POA: Diagnosis not present

## 2016-09-07 DIAGNOSIS — E7800 Pure hypercholesterolemia, unspecified: Secondary | ICD-10-CM | POA: Diagnosis not present

## 2016-09-07 DIAGNOSIS — M1990 Unspecified osteoarthritis, unspecified site: Secondary | ICD-10-CM | POA: Diagnosis not present

## 2016-09-07 DIAGNOSIS — E039 Hypothyroidism, unspecified: Secondary | ICD-10-CM | POA: Diagnosis not present

## 2016-09-16 DIAGNOSIS — H2512 Age-related nuclear cataract, left eye: Secondary | ICD-10-CM | POA: Diagnosis not present

## 2016-10-01 ENCOUNTER — Ambulatory Visit (INDEPENDENT_AMBULATORY_CARE_PROVIDER_SITE_OTHER): Payer: Medicare Other | Admitting: Vascular Surgery

## 2016-10-01 ENCOUNTER — Ambulatory Visit (INDEPENDENT_AMBULATORY_CARE_PROVIDER_SITE_OTHER): Payer: Medicare Other

## 2016-10-01 ENCOUNTER — Encounter (INDEPENDENT_AMBULATORY_CARE_PROVIDER_SITE_OTHER): Payer: Self-pay | Admitting: Vascular Surgery

## 2016-10-01 VITALS — BP 167/73 | HR 53 | Resp 196 | Wt 160.8 lb

## 2016-10-01 DIAGNOSIS — E785 Hyperlipidemia, unspecified: Secondary | ICD-10-CM | POA: Diagnosis not present

## 2016-10-01 DIAGNOSIS — I2510 Atherosclerotic heart disease of native coronary artery without angina pectoris: Secondary | ICD-10-CM | POA: Diagnosis not present

## 2016-10-01 DIAGNOSIS — I10 Essential (primary) hypertension: Secondary | ICD-10-CM

## 2016-10-01 DIAGNOSIS — I713 Abdominal aortic aneurysm, ruptured, unspecified: Secondary | ICD-10-CM

## 2016-10-01 NOTE — Progress Notes (Signed)
MRN : [id number]  Jay [name] is a 83 y.o. ([birth year]) male who presents with chief complaint of  Chief Complaint  Patient presents with  . Re-evaluation  .  History of Present Illness: The patient returns to the office for follow up  of a rupture abdominal aortic aneurysm status post stent graft placement on 06/23/2016.   Patient denies abdominal pain or back pain, no other abdominal complaints. No groin related complaints. He does give a history of right hip claudication. No symptoms consistent with distal embolization   There have been no interval changes in his overall healthcare since his last visit.   Patient denies amaurosis fugax or TIA symptoms. There is no history of claudication or rest pain symptoms of the lower extremities. The patient denies angina or shortness of breath.   Duplex US of the aorta and iliac arteries shows a 6.9 AAA sac with no endoleak, decrease in the sac compared to the previous study.  Current Meds  Medication Sig  . albuterol (PROVENTIL HFA;VENTOLIN HFA) 108 (90 BASE) MCG/ACT inhaler Inhale 2 puffs into the lungs every 6 (six) hours as needed for wheezing or shortness of breath.  [name] aspirin 81 MG tablet Take 81 mg by mouth daily.  [name] atorvastatin (LIPITOR) 10 MG tablet Take 10 mg by mouth daily.  . cetirizine (ZYRTEC) 10 MG tablet Take 10 mg by mouth as needed for allergies.   [name] levothyroxine (SYNTHROID, LEVOTHROID) 88 MCG tablet Take 88 mcg by mouth daily before breakfast.  . mometasone-formoterol (DULERA) 100-5 MCG/ACT AERO Inhale 2 puffs into the lungs as needed.   . Omega-3 300 MG CAPS Take 900 mg by mouth daily.  . saw palmetto 500 MG capsule Take 500 mg by mouth daily.    Past Medical History:  Diagnosis Date  . Allergic rhinitis    ,RAD wheezing  . Cataracts, bilateral   . Diverticulosis    , ACBE, flex, in 2003  . Dyslipidemia   . Fracture of left clavicle    , Hx  . Hearing loss    , Bilateral  . Hypothyroidism   . Insomnia     . Kidney stone   . OA (optic atrophy)   . Osteoarthritis   . Pelvic fracture (HCC)    , Hx  . RBBB (right bundle branch block)    , seen on EKG  . Varicose veins     Past Surgical History:  Procedure Laterality Date  . ABDOMINAL AORTOGRAM N/A 06/22/2016   Procedure: Abdominal Aortogram;  Surgeon: Katha Cabal, MD;  Location: [place] [facility];  Service: Cardiovascular;  Laterality: N/A;  . AORTIC INTERVENTION N/A 06/22/2016   Procedure: Aortic Intervention;  Surgeon: Katha Cabal, MD;  Location: [place] [facility];  Service: Cardiovascular;  Laterality: N/A;  . AORTIC VALVE REPLACEMENT  2011  . CATARACT EXTRACTION W/PHACO Right 02/17/2016   Procedure: CATARACT EXTRACTION PHACO AND INTRAOCULAR LENS PLACEMENT (IOC);  Surgeon: Ronnell Freshwater, MD;  Location: [place];  Service: Ophthalmology;  Laterality: Right;  RIGHT  . INGUINAL HERNIA REPAIR Bilateral   . Orchectomy Right   . THYROIDECTOMY      Social History Social History  Substance Use Topics  . Smoking status: Former Smoker    Quit date: 03/30/1965  . Smokeless tobacco: Current User    Types: Chew  . Alcohol use No     Comment: Rarely    Family History Family History  Problem Relation Age of Onset  .  Diabetes Mellitus II Mother     Allergies  Allergen Reactions  . Aleve [Naproxen Sodium] Hives  . Codeine Other (See Comments)    unknown  . Penicillin G Benzathine     Pt doesn't remember reaction     REVIEW OF SYSTEMS (Negative unless checked)  Constitutional: [] Weight loss  [] Fever  [] Chills Cardiac: [] Chest pain   [] Chest pressure   [] Palpitations   [] Shortness of breath when laying flat   [] Shortness of breath with exertion. Vascular:  [] Pain in legs with walking   [] Pain in legs at rest  [] History of DVT   [] Phlebitis   [] Swelling in legs   [] Varicose veins   [] Non-healing ulcers Pulmonary:   [] Uses home oxygen   [] Productive cough   [] Hemoptysis   [] Wheeze  [] COPD    [] Asthma Neurologic:  [] Dizziness   [] Seizures   [] History of stroke   [] History of TIA  [] Aphasia   [] Vissual changes   [] Weakness or numbness in arm   [] Weakness or numbness in leg Musculoskeletal:   [] Joint swelling   [] Joint pain   [] Low back pain Hematologic:  [] Easy bruising  [] Easy bleeding   [] Hypercoagulable state   [] Anemic Gastrointestinal:  [] Diarrhea   [] Vomiting  [] Gastroesophageal reflux/heartburn   [] Difficulty swallowing. Genitourinary:  [] Chronic kidney disease   [] Difficult urination  [] Frequent urination   [] Blood in urine Skin:  [] Rashes   [] Ulcers  Psychological:  [] History of anxiety   []  History of major depression.  Physical Examination  Vitals:   10/01/16 0955  BP: (!) 167/73  Pulse: (!) 53  Resp: (!) 196  Weight: 160 lb 12.8 oz (72.9 kg)   Body mass index is 23.07 kg/m. Gen: WD/WN, NAD Head: [place]/AT, No temporalis wasting.  Ear/Nose/Throat: Hearing grossly intact, nares w/o erythema or drainage Eyes: PER, EOMI, sclera nonicteric.  Neck: Supple, no large masses.   Pulmonary:  Good air movement, no audible wheezing bilaterally, no use of accessory muscles.  Cardiac: RRR, no JVD Vascular:  Vessel Right Left  Radial Palpable Palpable  Brachial Palpable Palpable  PT Palpable Palpable  DP Palpable Palpable  Gastrointestinal: Non-distended. No guarding/no peritoneal signs.  Musculoskeletal: M/S 5/5 throughout.  No deformity or atrophy.  Neurologic: CN 2-12 intact. Symmetrical.  Speech is fluent. Motor exam as listed above. Psychiatric: Judgment intact, Mood & affect appropriate for pt's clinical situation. Dermatologic: No rashes or ulcers noted.  No changes consistent with cellulitis. Lymph : No lichenification or skin changes of chronic lymphedema.  CBC Lab Results  Component Value Date   WBC 7.1 06/24/2016   HGB 9.4 (L) 06/24/2016   HCT 28.1 (L) 06/24/2016   MCV 83.5 06/24/2016   PLT 102 (L) 06/24/2016    BMET    Component Value Date/Time   NA  139 06/24/2016 0357   K 3.6 06/24/2016 0357   CL 110 06/24/2016 0357   CO2 24 06/24/2016 0357   GLUCOSE 116 (H) 06/24/2016 0357   BUN 15 06/24/2016 0357   CREATININE 1.00 06/24/2016 0357   CALCIUM 7.9 (L) 06/24/2016 0357   GFRNONAA >60 06/24/2016 0357   GFRAA >60 06/24/2016 0357   CrCl cannot be calculated (Patient's most recent lab result is older than the maximum 21 days allowed.).  COAG Lab Results  Component Value Date   INR 1.16 06/22/2016   INR 1.12 03/30/2016   INR 1.84 (H) 10/31/2009    Radiology No results found.   Assessment/Plan 1. Ruptured abdominal aortic aneurysm (AAA) (HCC) Recommend: Patient is status post  successful endovascular repair of the AAA.   No further intervention is required at this time.   No endoleak is detected and the aneurysm sac is stable.  The patient will continue antiplatelet therapy as prescribed as well as aggressive management of hyperlipidemia. Exercise is again strongly encouraged.   However, endografts require continued surveillance with ultrasound or CT scan. This is mandatory to detect any changes that allow repressurization of the aneurysm sac.  The patient is informed that this would be asymptomatic.  The patient is reminded that lifelong routine surveillance is a necessity with an endograft. Patient will continue to follow-up at 6 month intervals with ultrasound of the aorta.  2. Coronary artery disease involving native coronary artery of native heart without angina pectoris Continue cardiac and antihypertensive medications as already ordered and reviewed, no changes at this time.  Continue statin as ordered and reviewed, no changes at this time  Nitrates PRN for chest pain   3. Essential hypertension Continue antihypertensive medications as already ordered, these medications have been reviewed and there are no changes at this time.   4. Dyslipidemia Continue statin as ordered and reviewed, no changes at this  time     Hortencia Pilar, MD  10/01/2016 10:41 AM

## 2016-12-28 ENCOUNTER — Ambulatory Visit (INDEPENDENT_AMBULATORY_CARE_PROVIDER_SITE_OTHER): Payer: Medicare Other | Admitting: Cardiology

## 2016-12-28 ENCOUNTER — Encounter: Payer: Self-pay | Admitting: Cardiology

## 2016-12-28 ENCOUNTER — Encounter (INDEPENDENT_AMBULATORY_CARE_PROVIDER_SITE_OTHER): Payer: Self-pay

## 2016-12-28 VITALS — BP 132/80 | HR 66 | Ht 69.0 in | Wt 160.0 lb

## 2016-12-28 DIAGNOSIS — D692 Other nonthrombocytopenic purpura: Secondary | ICD-10-CM | POA: Diagnosis not present

## 2016-12-28 DIAGNOSIS — I10 Essential (primary) hypertension: Secondary | ICD-10-CM

## 2016-12-28 DIAGNOSIS — Z09 Encounter for follow-up examination after completed treatment for conditions other than malignant neoplasm: Secondary | ICD-10-CM

## 2016-12-28 DIAGNOSIS — I713 Abdominal aortic aneurysm, ruptured, unspecified: Secondary | ICD-10-CM

## 2016-12-28 DIAGNOSIS — E785 Hyperlipidemia, unspecified: Secondary | ICD-10-CM

## 2016-12-28 DIAGNOSIS — I2510 Atherosclerotic heart disease of native coronary artery without angina pectoris: Secondary | ICD-10-CM

## 2016-12-28 DIAGNOSIS — I4510 Unspecified right bundle-branch block: Secondary | ICD-10-CM

## 2016-12-28 DIAGNOSIS — Z952 Presence of prosthetic heart valve: Secondary | ICD-10-CM

## 2016-12-28 LAB — CBC
Hematocrit: 34.1 % — ABNORMAL LOW (ref 37.5–51.0)
Hemoglobin: 11.8 g/dL — ABNORMAL LOW (ref 13.0–17.7)
MCH: 28.9 pg (ref 26.6–33.0)
MCHC: 34.6 g/dL (ref 31.5–35.7)
MCV: 83 fL (ref 79–97)
Platelets: 129 10*3/uL — ABNORMAL LOW (ref 150–379)
RBC: 4.09 x10E6/uL — ABNORMAL LOW (ref 4.14–5.80)
RDW: 13.4 % (ref 12.3–15.4)
WBC: 5.5 10*3/uL (ref 3.4–10.8)

## 2016-12-28 MED ORDER — ASPIRIN EC 81 MG PO TBEC: 81.0000 mg | DELAYED_RELEASE_TABLET | Freq: Every day | ORAL | 3 refills | Status: DC

## 2016-12-28 NOTE — Patient Instructions (Signed)
Medication Instructions:  Please start Asprin 81 mg a day. Continue all other medications as listed.  Labwork: Please have blood work today. (CBC)  Follow-Up: Follow up in 1 year with Dr. Marlou Porch.  You will receive a letter in the mail 2 months before you are due.  Please call us when you receive this letter to schedule your follow up appointment.   If you need a refill on your cardiac medications before your next appointment, please call your pharmacy.  Thank you for choosing [facility]!!

## 2016-12-28 NOTE — Progress Notes (Signed)
[facility] [address] [place], [place]  [address] Phone: [phone] Fax:  [phone]  Date:  12/28/2016   ID:  Jay [name], DOB [birth year], MRN [id number]  PCP:  Wenda Low, MD   History of Present Illness: Jay [name] is a 83 y.o. male with aortic valve replacement on 10/31/09 with a pericardial #23 [place]. right bundle branch block chronic. He had a ruptured a abdominal aortic aneurysm treated at [facility]. At one point noted some dark stool and stopped his aspirin. Has some residual right hip pain.  The LAD had about 30% proximal stenosis with some ectatic portion in the mid segment.  The left circumflex had some aneurysmal dilatation, but no stenosis.  The right coronary artery was a small nondominant vessel.  Children live behind him.   Wife had MI summer 2013. Dr. Burt Knack took care of her.    Wt Readings from Last 3 Encounters:  12/28/16 160 lb (72.6 kg)  10/01/16 160 lb 12.8 oz (72.9 kg)  07/20/16 164 lb (74.4 kg)     Past Medical History:  Diagnosis Date  . Allergic rhinitis    ,RAD wheezing  . Cataracts, bilateral   . Diverticulosis    , ACBE, flex, in 2003  . Dyslipidemia   . Fracture of left clavicle    , Hx  . Hearing loss    , Bilateral  . Hypothyroidism   . Insomnia   . Kidney stone   . OA (optic atrophy)   . Osteoarthritis   . Pelvic fracture (HCC)    , Hx  . RBBB (right bundle branch block)    , seen on EKG  . Varicose veins     Past Surgical History:  Procedure Laterality Date  . ABDOMINAL AORTOGRAM N/A 06/22/2016   Procedure: Abdominal Aortogram;  Surgeon: Katha Cabal, MD;  Location: [place] [facility];  Service: Cardiovascular;  Laterality: N/A;  . AORTIC INTERVENTION N/A 06/22/2016   Procedure: Aortic Intervention;  Surgeon: Katha Cabal, MD;  Location: [place] [facility];  Service: Cardiovascular;  Laterality: N/A;  . AORTIC VALVE REPLACEMENT  2011  . CATARACT EXTRACTION W/PHACO Right 02/17/2016   Procedure: CATARACT EXTRACTION PHACO AND INTRAOCULAR LENS PLACEMENT (IOC);  Surgeon: Ronnell Freshwater, MD;  Location: [facility];  Service: Ophthalmology;  Laterality: Right;  RIGHT  . INGUINAL HERNIA REPAIR Bilateral   . Orchectomy Right   . THYROIDECTOMY      Current Outpatient Prescriptions  Medication Sig Dispense Refill  . albuterol (PROVENTIL HFA;VENTOLIN HFA) 108 (90 BASE) MCG/ACT inhaler Inhale 2 puffs into the lungs every 6 (six) hours as needed for wheezing or shortness of breath.    [name] atorvastatin (LIPITOR) 10 MG tablet Take 10 mg by mouth daily.    . cetirizine (ZYRTEC) 10 MG tablet Take 10 mg by mouth as needed for allergies.     [name] levothyroxine (SYNTHROID, LEVOTHROID) 88 MCG tablet Take 88 mcg by mouth daily before breakfast.    . mometasone-formoterol (DULERA) 100-5 MCG/ACT AERO Inhale 2 puffs into the lungs as needed.     . Omega-3 300 MG CAPS Take 900 mg by mouth daily.    . saw palmetto 500 MG capsule Take 500 mg by mouth daily.    [name] aspirin EC 81 MG tablet Take 1 tablet (81 mg total) by mouth daily. 90 tablet 3   No current facility-administered medications for this visit.     Allergies:  Allergies  Allergen Reactions  . Aleve [Naproxen Sodium] Hives  . Codeine Other (See Comments)    unknown  . Penicillin G Benzathine     Pt doesn't remember reaction    Social History:  The patient  reports that he quit smoking about 51 years ago. His smokeless tobacco use includes Chew. He reports that he does not drink alcohol or use drugs.   ROS:  Please see the history of present illness.   Denies any CV, bleeding, orthopnea, PND   PHYSICAL EXAM: VS:  BP 132/80   Pulse 66   Ht 5\' 9"  (1.753 m)   Wt 160 lb (72.6 kg)   BMI 23.63 kg/m  GEN: Well nourished, well developed, in no acute distress  HEENT: normal  Neck: no JVD, carotid bruits, or masses Cardiac: RRR; no murmurs, rubs, or gallops,no edema  Respiratory:  clear to auscultation bilaterally,  normal work of breathing GI: soft, nontender, nondistended, + BS MS: no deformity or atrophy  Skin: Solar purpura noted warm and dry, no rash Neuro:  Alert and Oriented x 3, Strength and sensation are intact Psych: euthymic mood, full affect   EKG: Today 10/25/15-sinus rhythm, 61, PA-C, sinus arrhythmia noted, PVC, right bundle branch block personally viewed-prior 10/09/14-sinus bradycardia, sinus arrhythmia, 56, likely PACs, right bundle block, left posterior fascicular block personally viewed-no significant change in prior 1/15: Sinus bradycardia heart rate 56, right bundle branch block, nonspecific ST-T wave changes, Jay [name] sinus arrhythmia. Not atrial fibrillation. No significant change from prior.  Labs: LDL 83, HDL 38, hemoglobin A1c 5.4, hemoglobin 9.4, creatinine 1.0  ASSESSMENT AND PLAN:   1. Right bundle branch block-chronic, stable, no high risk symptoms such as syncope. 2. Ruptured aortic aneurysm - stent graft. Dr. Alba Cory. Has some hip pain right. Dr. Delana Meyer aware. Very thankful for his service. 3. Aortic valve replacement-currently doing very well no symptoms. No murmurs, dental antibiotics.  4. Melena - for 2 days then turned tan again. Recommend GI evaluation. He stopped asa 81. This was quite some time ago. He does deserve to be on 81 mg of aspirin especially given his bioprosthetic aortic valve. I do not want to have an issue with valve thrombosis. He has not noted any further black or tarry stools. I will check a CBC today and encourage restart aspirin 81 mg. I also wanted him to discuss this further with Dr. Deforest Hoyles to see if a colonoscopy or endoscopy would be in order. 5. Solar purpura - easy bruising noted. This is why he stopped his aspirin in part. 6. Orthostatic hypotension-currently doing well with salt liberalization. Blood pressure excellent today. No changes. 7. Hyperlipidemia-low-dose atorvastatin. Tried holiday but no change, he is back on medication. No changes  made. 8. One-year follow-up  Signed, Candee Furbish, MD [facility]  12/28/2016 11:56 AM

## 2017-01-11 DIAGNOSIS — Z23 Encounter for immunization: Secondary | ICD-10-CM | POA: Diagnosis not present

## 2017-03-10 DIAGNOSIS — N182 Chronic kidney disease, stage 2 (mild): Secondary | ICD-10-CM | POA: Diagnosis not present

## 2017-03-10 DIAGNOSIS — E039 Hypothyroidism, unspecified: Secondary | ICD-10-CM | POA: Diagnosis not present

## 2017-03-10 DIAGNOSIS — Z952 Presence of prosthetic heart valve: Secondary | ICD-10-CM | POA: Diagnosis not present

## 2017-03-10 DIAGNOSIS — E7800 Pure hypercholesterolemia, unspecified: Secondary | ICD-10-CM | POA: Diagnosis not present

## 2017-03-10 DIAGNOSIS — Z9889 Other specified postprocedural states: Secondary | ICD-10-CM | POA: Diagnosis not present

## 2017-03-10 DIAGNOSIS — Z8679 Personal history of other diseases of the circulatory system: Secondary | ICD-10-CM | POA: Diagnosis not present

## 2017-03-10 DIAGNOSIS — H9193 Unspecified hearing loss, bilateral: Secondary | ICD-10-CM | POA: Diagnosis not present

## 2017-03-10 DIAGNOSIS — D696 Thrombocytopenia, unspecified: Secondary | ICD-10-CM | POA: Diagnosis not present

## 2017-03-10 DIAGNOSIS — Z1389 Encounter for screening for other disorder: Secondary | ICD-10-CM | POA: Diagnosis not present

## 2017-03-10 DIAGNOSIS — J309 Allergic rhinitis, unspecified: Secondary | ICD-10-CM | POA: Diagnosis not present

## 2017-03-10 DIAGNOSIS — R7303 Prediabetes: Secondary | ICD-10-CM | POA: Diagnosis not present

## 2017-03-10 DIAGNOSIS — M1990 Unspecified osteoarthritis, unspecified site: Secondary | ICD-10-CM | POA: Diagnosis not present

## 2017-03-10 DIAGNOSIS — Z0000 Encounter for general adult medical examination without abnormal findings: Secondary | ICD-10-CM | POA: Diagnosis not present

## 2017-04-04 NOTE — Progress Notes (Signed)
MRN : [id number]  Jay [name] is a 84 y.o. (01/16/1926) male who presents with chief complaint of No chief complaint on file. [name]  History of Present Illness: The patient returns to the office for follow up of a ruptureabdominal aortic aneurysm status post stent graft placement on 06/23/2016.   Patient denies abdominal pain or back pain, no other abdominal complaints. No groin related complaints. He does give a history of right hip claudication. No symptoms consistent with distal embolization   There have been no interval changes in his overall healthcare since his last visit.   Patient denies amaurosis fugax or TIA symptoms. There is no history of claudication or rest pain symptoms of the lower extremities. The patient denies angina or shortness of breath.   Duplex US of the aorta and iliac arteries shows a 6.4 AAA sac with no endoleak.  Previous duplex US of the aorta and iliac arteries shows a 6.9 AAA sac with no endoleak, decrease in the sac compared to the previous study.     No outpatient medications have been marked as taking for the 04/05/17 encounter (Appointment) with Delana Meyer, Dolores Lory, MD.    Past Medical History:  Diagnosis Date  . Allergic rhinitis    ,RAD wheezing  . Cataracts, bilateral   . Diverticulosis    , ACBE, flex, in 2003  . Dyslipidemia   . Fracture of left clavicle    , Hx  . Hearing loss    , Bilateral  . Hypothyroidism   . Insomnia   . Kidney stone   . OA (optic atrophy)   . Osteoarthritis   . Pelvic fracture (HCC)    , Hx  . RBBB (right bundle branch block)    , seen on EKG  . Varicose veins     Past Surgical History:  Procedure Laterality Date  . ABDOMINAL AORTOGRAM N/A 06/22/2016   Procedure: Abdominal Aortogram;  Surgeon: Katha Cabal, MD;  Location: [facility];  Service: Cardiovascular;  Laterality: N/A;  . AORTIC INTERVENTION N/A 06/22/2016   Procedure: Aortic Intervention;  Surgeon: Katha Cabal, MD;   Location: [facility] [facility];  Service: Cardiovascular;  Laterality: N/A;  . AORTIC VALVE REPLACEMENT  2011  . CATARACT EXTRACTION W/PHACO Right 02/17/2016   Procedure: CATARACT EXTRACTION PHACO AND INTRAOCULAR LENS PLACEMENT (IOC);  Surgeon: Ronnell Freshwater, MD;  Location: [facility];  Service: Ophthalmology;  Laterality: Right;  RIGHT  . INGUINAL HERNIA REPAIR Bilateral   . Orchectomy Right   . THYROIDECTOMY      Social History Social History   Tobacco Use  . Smoking status: Former Smoker    Last attempt to quit: 03/30/1965    Years since quitting: 52.0  . Smokeless tobacco: Current User    Types: Chew  Substance Use Topics  . Alcohol use: No    Comment: Rarely  . Drug use: No    Family History Family History  Problem Relation Age of Onset  . Diabetes Mellitus II Mother     Allergies  Allergen Reactions  . Aleve [Naproxen Sodium] Hives  . Codeine Other (See Comments)    unknown  . Penicillin G Benzathine     Pt doesn't remember reaction     REVIEW OF SYSTEMS (Negative unless checked)  Constitutional: [] Weight loss  [] Fever  [] Chills Cardiac: [] Chest pain   [] Chest pressure   [] Palpitations   [] Shortness of breath when laying flat   [] Shortness of breath with exertion.  Vascular:  [x] Pain in legs with walking   [] Pain in legs at rest  [] History of DVT   [] Phlebitis   [] Swelling in legs   [] Varicose veins   [] Non-healing ulcers Pulmonary:   [] Uses home oxygen   [] Productive cough   [] Hemoptysis   [] Wheeze  [] COPD   [] Asthma Neurologic:  [] Dizziness   [] Seizures   [] History of stroke   [] History of TIA  [] Aphasia   [] Vissual changes   [] Weakness or numbness in arm   [] Weakness or numbness in leg Musculoskeletal:   [] Joint swelling   [x] Joint pain   [x] Low back pain Hematologic:  [] Easy bruising  [] Easy bleeding   [] Hypercoagulable state   [] Anemic Gastrointestinal:  [] Diarrhea   [] Vomiting  [] Gastroesophageal reflux/heartburn   [] Difficulty  swallowing. Genitourinary:  [] Chronic kidney disease   [] Difficult urination  [] Frequent urination   [] Blood in urine Skin:  [] Rashes   [] Ulcers  Psychological:  [] History of anxiety   []  History of major depression.  Physical Examination  There were no vitals filed for this visit. There is no height or weight on file to calculate BMI. Gen: WD/WN, NAD Head: [place]/AT, No temporalis wasting.  Ear/Nose/Throat: Hearing grossly intact, nares w/o erythema or drainage Eyes: PER, EOMI, sclera nonicteric.  Neck: Supple, no large masses.   Pulmonary:  Good air movement, no audible wheezing bilaterally, no use of accessory muscles.  Cardiac: RRR, no JVD Vascular:  Vessel Right Left  Radial Palpable Palpable  PT Trace Palpable Trace Palpable  DP Not Palpable Not Palpable  Gastrointestinal: Non-distended. No guarding/no peritoneal signs.  Musculoskeletal: M/S 5/5 throughout.  No deformity or atrophy.  Neurologic: CN 2-12 intact. Symmetrical.  Speech is fluent. Motor exam as listed above. Psychiatric: Judgment intact, Mood & affect appropriate for pt's clinical situation. Dermatologic: No rashes or ulcers noted.  No changes consistent with cellulitis. Lymph : No lichenification or skin changes of chronic lymphedema.  CBC Lab Results  Component Value Date   WBC 5.5 12/28/2016   HGB 11.8 (L) 12/28/2016   HCT 34.1 (L) 12/28/2016   MCV 83 12/28/2016   PLT 129 (L) 12/28/2016    BMET    Component Value Date/Time   NA 139 06/24/2016 0357   K 3.6 06/24/2016 0357   CL 110 06/24/2016 0357   CO2 24 06/24/2016 0357   GLUCOSE 116 (H) 06/24/2016 0357   BUN 15 06/24/2016 0357   CREATININE 1.00 06/24/2016 0357   CALCIUM 7.9 (L) 06/24/2016 0357   GFRNONAA >60 06/24/2016 0357   GFRAA >60 06/24/2016 0357   CrCl cannot be calculated (Patient's most recent lab result is older than the maximum 21 days allowed.).  COAG Lab Results  Component Value Date   INR 1.16 06/22/2016   INR 1.12 03/30/2016    INR 1.84 (H) 10/31/2009    Radiology No results found.   Assessment/Plan 1. Ruptured abdominal aortic aneurysm (AAA) (HCC) Recommend: Patient is status post successful endovascular repair of the AAA.   No further intervention is required at this time.   No endoleak is detected and the aneurysm sac is stable.  The patient will continue antiplatelet therapy as prescribed as well as aggressive management of hyperlipidemia. Exercise is again strongly encouraged.   However, endografts require continued surveillance with ultrasound or CT scan. This is mandatory to detect any changes that allow repressurization of the aneurysm sac.  The patient is informed that this would be asymptomatic.  He is also c/o leg pain consistent with mild PAD and I  will obtain ABI's with his next visit  The patient is reminded that lifelong routine surveillance is a necessity with an endograft. Patient will continue to follow-up at 12 month intervals with ultrasound of the aorta.  2. Essential hypertension Continue antihypertensive medications as already ordered, these medications have been reviewed and there are no changes at this time.   3. Coronary artery disease involving native coronary artery of native heart without angina pectoris Continue cardiac and antihypertensive medications as already ordered and reviewed, no changes at this time.  Continue statin as ordered and reviewed, no changes at this time  Nitrates PRN for chest pain   4. Dyslipidemia Continue statin as ordered and reviewed, no changes at this time     Hortencia Pilar, MD  04/04/2017 12:47 PM

## 2017-04-05 ENCOUNTER — Ambulatory Visit (INDEPENDENT_AMBULATORY_CARE_PROVIDER_SITE_OTHER): Payer: Medicare Other | Admitting: Vascular Surgery

## 2017-04-05 ENCOUNTER — Encounter (INDEPENDENT_AMBULATORY_CARE_PROVIDER_SITE_OTHER): Payer: Self-pay | Admitting: Vascular Surgery

## 2017-04-05 ENCOUNTER — Ambulatory Visit (INDEPENDENT_AMBULATORY_CARE_PROVIDER_SITE_OTHER): Payer: Medicare Other

## 2017-04-05 VITALS — BP 132/77 | HR 61 | Resp 15 | Ht 69.5 in | Wt 160.0 lb

## 2017-04-05 DIAGNOSIS — I713 Abdominal aortic aneurysm, ruptured, unspecified: Secondary | ICD-10-CM

## 2017-04-05 DIAGNOSIS — I10 Essential (primary) hypertension: Secondary | ICD-10-CM

## 2017-04-05 DIAGNOSIS — E785 Hyperlipidemia, unspecified: Secondary | ICD-10-CM | POA: Diagnosis not present

## 2017-04-05 DIAGNOSIS — I2510 Atherosclerotic heart disease of native coronary artery without angina pectoris: Secondary | ICD-10-CM | POA: Diagnosis not present

## 2017-05-30 ENCOUNTER — Emergency Department (HOSPITAL_COMMUNITY): Payer: Medicare Other

## 2017-05-30 ENCOUNTER — Other Ambulatory Visit: Payer: Self-pay

## 2017-05-30 ENCOUNTER — Encounter (HOSPITAL_COMMUNITY): Payer: Self-pay | Admitting: *Deleted

## 2017-05-30 ENCOUNTER — Inpatient Hospital Stay (HOSPITAL_COMMUNITY)
Admission: EM | Admit: 2017-05-30 | Discharge: 2017-06-02 | DRG: 242 | Disposition: A | Discharge status: Home Health | Payer: Medicare Other | Attending: Internal Medicine | Admitting: Internal Medicine

## 2017-05-30 DIAGNOSIS — Z961 Presence of intraocular lens: Secondary | ICD-10-CM | POA: Diagnosis present

## 2017-05-30 DIAGNOSIS — E785 Hyperlipidemia, unspecified: Secondary | ICD-10-CM | POA: Diagnosis present

## 2017-05-30 DIAGNOSIS — I442 Atrioventricular block, complete: Secondary | ICD-10-CM | POA: Diagnosis not present

## 2017-05-30 DIAGNOSIS — R001 Bradycardia, unspecified: Secondary | ICD-10-CM

## 2017-05-30 DIAGNOSIS — I10 Essential (primary) hypertension: Secondary | ICD-10-CM

## 2017-05-30 DIAGNOSIS — M1990 Unspecified osteoarthritis, unspecified site: Secondary | ICD-10-CM | POA: Diagnosis present

## 2017-05-30 DIAGNOSIS — J811 Chronic pulmonary edema: Secondary | ICD-10-CM | POA: Diagnosis present

## 2017-05-30 DIAGNOSIS — I951 Orthostatic hypotension: Secondary | ICD-10-CM | POA: Diagnosis present

## 2017-05-30 DIAGNOSIS — Z87891 Personal history of nicotine dependence: Secondary | ICD-10-CM

## 2017-05-30 DIAGNOSIS — Z79899 Other long term (current) drug therapy: Secondary | ICD-10-CM

## 2017-05-30 DIAGNOSIS — I4510 Unspecified right bundle-branch block: Secondary | ICD-10-CM | POA: Diagnosis present

## 2017-05-30 DIAGNOSIS — G4700 Insomnia, unspecified: Secondary | ICD-10-CM | POA: Diagnosis not present

## 2017-05-30 DIAGNOSIS — I462 Cardiac arrest due to underlying cardiac condition: Secondary | ICD-10-CM | POA: Diagnosis not present

## 2017-05-30 DIAGNOSIS — N179 Acute kidney failure, unspecified: Secondary | ICD-10-CM | POA: Diagnosis present

## 2017-05-30 DIAGNOSIS — R441 Visual hallucinations: Secondary | ICD-10-CM | POA: Diagnosis present

## 2017-05-30 DIAGNOSIS — R0602 Shortness of breath: Secondary | ICD-10-CM

## 2017-05-30 DIAGNOSIS — Z880 Allergy status to penicillin: Secondary | ICD-10-CM

## 2017-05-30 DIAGNOSIS — Z953 Presence of xenogenic heart valve: Secondary | ICD-10-CM

## 2017-05-30 DIAGNOSIS — I2510 Atherosclerotic heart disease of native coronary artery without angina pectoris: Secondary | ICD-10-CM | POA: Diagnosis not present

## 2017-05-30 DIAGNOSIS — I441 Atrioventricular block, second degree: Secondary | ICD-10-CM | POA: Diagnosis not present

## 2017-05-30 DIAGNOSIS — Z888 Allergy status to other drugs, medicaments and biological substances status: Secondary | ICD-10-CM

## 2017-05-30 DIAGNOSIS — Z87442 Personal history of urinary calculi: Secondary | ICD-10-CM

## 2017-05-30 DIAGNOSIS — Z885 Allergy status to narcotic agent status: Secondary | ICD-10-CM

## 2017-05-30 DIAGNOSIS — H9193 Unspecified hearing loss, bilateral: Secondary | ICD-10-CM | POA: Diagnosis present

## 2017-05-30 DIAGNOSIS — Z79890 Hormone replacement therapy (postmenopausal): Secondary | ICD-10-CM

## 2017-05-30 DIAGNOSIS — Z9841 Cataract extraction status, right eye: Secondary | ICD-10-CM

## 2017-05-30 DIAGNOSIS — I2583 Coronary atherosclerosis due to lipid rich plaque: Secondary | ICD-10-CM | POA: Diagnosis not present

## 2017-05-30 DIAGNOSIS — Z950 Presence of cardiac pacemaker: Secondary | ICD-10-CM

## 2017-05-30 LAB — CBC WITH DIFFERENTIAL/PLATELET
Basophils Absolute: 0 10*3/uL (ref 0.0–0.1)
Basophils Relative: 0 %
Eosinophils Absolute: 0.1 10*3/uL (ref 0.0–0.7)
Eosinophils Relative: 1 %
HCT: 36.4 % — ABNORMAL LOW (ref 39.0–52.0)
Hemoglobin: 11.7 g/dL — ABNORMAL LOW (ref 13.0–17.0)
Lymphocytes Relative: 23 %
Lymphs Abs: 1.7 10*3/uL (ref 0.7–4.0)
MCH: 28.7 pg (ref 26.0–34.0)
MCHC: 32.1 g/dL (ref 30.0–36.0)
MCV: 89.4 fL (ref 78.0–100.0)
Monocytes Absolute: 0.6 10*3/uL (ref 0.1–1.0)
Monocytes Relative: 8 %
Neutro Abs: 5.1 10*3/uL (ref 1.7–7.7)
Neutrophils Relative %: 68 %
Platelets: 115 10*3/uL — ABNORMAL LOW (ref 150–400)
RBC: 4.07 MIL/uL — ABNORMAL LOW (ref 4.22–5.81)
RDW: 14.3 % (ref 11.5–15.5)
WBC: 7.4 10*3/uL (ref 4.0–10.5)

## 2017-05-30 LAB — COMPREHENSIVE METABOLIC PANEL
ALT: 20 U/L (ref 17–63)
AST: 24 U/L (ref 15–41)
Albumin: 3.3 g/dL — ABNORMAL LOW (ref 3.5–5.0)
Alkaline Phosphatase: 76 U/L (ref 38–126)
Anion gap: 10 (ref 5–15)
BUN: 21 mg/dL — ABNORMAL HIGH (ref 6–20)
CO2: 27 mmol/L (ref 22–32)
Calcium: 8.9 mg/dL (ref 8.9–10.3)
Chloride: 101 mmol/L (ref 101–111)
Creatinine, Ser: 1.38 mg/dL — ABNORMAL HIGH (ref 0.61–1.24)
GFR calc Af Amer: 51 mL/min — ABNORMAL LOW (ref >60)
GFR calc non Af Amer: 44 mL/min — ABNORMAL LOW (ref >60)
Glucose, Bld: 128 mg/dL — ABNORMAL HIGH (ref 65–99)
Potassium: 4.9 mmol/L (ref 3.5–5.1)
Sodium: 138 mmol/L (ref 135–145)
Total Bilirubin: 0.6 mg/dL (ref 0.3–1.2)
Total Protein: 6.7 g/dL (ref 6.5–8.1)

## 2017-05-30 LAB — CBC
HCT: 38.5 % — ABNORMAL LOW (ref 39.0–52.0)
Hemoglobin: 12.5 g/dL — ABNORMAL LOW (ref 13.0–17.0)
MCH: 28.9 pg (ref 26.0–34.0)
MCHC: 32.5 g/dL (ref 30.0–36.0)
MCV: 88.9 fL (ref 78.0–100.0)
Platelets: 126 10*3/uL — ABNORMAL LOW (ref 150–400)
RBC: 4.33 MIL/uL (ref 4.22–5.81)
RDW: 14.3 % (ref 11.5–15.5)
WBC: 6.4 10*3/uL (ref 4.0–10.5)

## 2017-05-30 LAB — BASIC METABOLIC PANEL
Anion gap: 10 (ref 5–15)
BUN: 21 mg/dL — ABNORMAL HIGH (ref 6–20)
CO2: 25 mmol/L (ref 22–32)
Calcium: 8.8 mg/dL — ABNORMAL LOW (ref 8.9–10.3)
Chloride: 100 mmol/L — ABNORMAL LOW (ref 101–111)
Creatinine, Ser: 1.44 mg/dL — ABNORMAL HIGH (ref 0.61–1.24)
GFR calc Af Amer: 48 mL/min — ABNORMAL LOW (ref >60)
GFR calc non Af Amer: 42 mL/min — ABNORMAL LOW (ref >60)
Glucose, Bld: 126 mg/dL — ABNORMAL HIGH (ref 65–99)
Potassium: 4.2 mmol/L (ref 3.5–5.1)
Sodium: 135 mmol/L (ref 135–145)

## 2017-05-30 LAB — PROTIME-INR
INR: 1.14
Prothrombin Time: 14.5 seconds (ref 11.4–15.2)

## 2017-05-30 LAB — MAGNESIUM: Magnesium: 2.4 mg/dL (ref 1.7–2.4)

## 2017-05-30 LAB — I-STAT TROPONIN, ED: Troponin i, poc: 0 ng/mL (ref 0.00–0.08)

## 2017-05-30 LAB — PHOSPHORUS: Phosphorus: 3 mg/dL (ref 2.5–4.6)

## 2017-05-30 LAB — APTT: aPTT: 28 seconds (ref 24–36)

## 2017-05-30 LAB — TSH: TSH: 0.836 u[IU]/mL (ref 0.350–4.500)

## 2017-05-30 MED ORDER — HEPARIN SODIUM (PORCINE) 5000 UNIT/ML IJ SOLN
5000.0000 [IU] | Freq: Three times a day (TID) | INTRAMUSCULAR | Status: DC
  Administered 2017-05-31: 5000 [IU] via SUBCUTANEOUS
  Filled 2017-05-30: qty 1

## 2017-05-30 MED ORDER — LEVOTHYROXINE SODIUM 88 MCG PO TABS
88.0000 ug | ORAL_TABLET | Freq: Every day | ORAL | Status: DC
  Administered 2017-05-31 – 2017-06-02 (×3): 88 ug via ORAL
  Filled 2017-05-30 (×3): qty 1

## 2017-05-30 MED ORDER — ASPIRIN EC 81 MG PO TBEC
81.0000 mg | DELAYED_RELEASE_TABLET | Freq: Every day | ORAL | Status: DC
  Administered 2017-05-31 – 2017-06-02 (×3): 81 mg via ORAL
  Filled 2017-05-30 (×3): qty 1

## 2017-05-30 MED ORDER — ATORVASTATIN CALCIUM 10 MG PO TABS
10.0000 mg | ORAL_TABLET | Freq: Every day | ORAL | Status: DC
Start: 2017-05-31 — End: 2017-06-02
  Administered 2017-05-31 – 2017-06-01 (×2): 10 mg via ORAL
  Filled 2017-05-30 (×2): qty 1

## 2017-05-30 MED ORDER — ACETAMINOPHEN 325 MG PO TABS
650.0000 mg | ORAL_TABLET | Freq: Four times a day (QID) | ORAL | Status: DC | PRN
  Administered 2017-05-31: 650 mg via ORAL
  Filled 2017-05-30: qty 2

## 2017-05-30 MED ORDER — SODIUM CHLORIDE 0.9 % IV SOLN
INTRAVENOUS | Status: DC
  Administered 2017-05-30: 22:00:00 via INTRAVENOUS

## 2017-05-30 MED ORDER — ALPRAZOLAM 0.25 MG PO TABS
0.2500 mg | ORAL_TABLET | Freq: Once | ORAL | Status: AC
  Administered 2017-05-31: 0.25 mg via ORAL
  Filled 2017-05-30: qty 1

## 2017-05-30 NOTE — ED Triage Notes (Signed)
Pt reports having dizziness and feeling sob x 2 days. Pt reports intermittent episodes of warmth to his head and numbness/tingling to his face and hands. Had discoloration to left index finger pta. HR 30's at triage.

## 2017-05-30 NOTE — ED Provider Notes (Signed)
[facility] 2H CARDIOVASCULAR ICU Provider Note   CSN: [id number] Arrival date & time: 05/30/17  1602     History   Chief Complaint Chief Complaint  Patient presents with  . Numbness  . Shortness of Breath    HPI Jay [name] is a 84 y.o. male.  Complaint is weakness, lightheadedness, bradycardia.  HPI 84 year old male.  History of orthostasis.  Is had his blood pressure medicines all weaned, tapered, and discontinued over the last year.  For the last 3 days he has had increasing feelings of warmth to his head with numbness and tingling and near syncopal episodes.  Upon arrival noted to have heart rate in the 30s.  History of right bundle branch block.  Has porcine aortic valve replacement.  History of ruptured aortic aneurysm treated with endograft successfully last year.  Past Medical History:  Diagnosis Date  . Allergic rhinitis    ,RAD wheezing  . Cataracts, bilateral   . Diverticulosis    , ACBE, flex, in 2003  . Dyslipidemia   . Fracture of left clavicle    , Hx  . Hearing loss    , Bilateral  . Hypothyroidism   . Insomnia   . Kidney stone   . OA (optic atrophy)   . Osteoarthritis   . Pelvic fracture (HCC)    , Hx  . RBBB (right bundle branch block)    , seen on EKG  . Varicose veins     Patient Active Problem List   Diagnosis Date Noted  . Mobitz type 2 second degree atrioventricular block 05/30/2017  . SOB (shortness of breath)   . Coronary artery disease due to lipid rich plaque   . Solar purpura ([place]) 12/28/2016  . Ruptured abdominal aortic aneurysm (AAA) ([place]) 06/23/2016  . Chest pain 03/30/2016  . Essential hypertension 03/30/2016  . CAD (coronary artery disease), native coronary artery 03/30/2016  . Dizziness 03/30/2016  . H/O aortic valve replacement 10/03/2013  . Hypothyroidism   . Dyslipidemia   . RBBB (right bundle branch block)     Past Surgical History:  Procedure Laterality Date  . ABDOMINAL AORTOGRAM N/A 06/22/2016   Procedure:  Abdominal Aortogram;  Surgeon: Katha Cabal, MD;  Location: [facility];  Service: Cardiovascular;  Laterality: N/A;  . AORTIC INTERVENTION N/A 06/22/2016   Procedure: Aortic Intervention;  Surgeon: Katha Cabal, MD;  Location: [facility];  Service: Cardiovascular;  Laterality: N/A;  . AORTIC VALVE REPLACEMENT  2011  . CATARACT EXTRACTION W/PHACO Right 02/17/2016   Procedure: CATARACT EXTRACTION PHACO AND INTRAOCULAR LENS PLACEMENT (IOC);  Surgeon: Ronnell Freshwater, MD;  Location: [facility];  Service: Ophthalmology;  Laterality: Right;  RIGHT  . INGUINAL HERNIA REPAIR Bilateral   . Orchectomy Right   . THYROIDECTOMY         Home Medications    Prior to Admission medications   Medication Sig Start Date End Date Taking? Authorizing Provider  albuterol (PROVENTIL HFA;VENTOLIN HFA) 108 (90 BASE) MCG/ACT inhaler Inhale 2 puffs into the lungs every 6 (six) hours as needed for wheezing or shortness of breath.   Yes [provider]  aspirin EC 81 MG tablet Take 1 tablet (81 mg total) by mouth daily. 12/28/16  Yes Jerline Pain, MD  atorvastatin (LIPITOR) 10 MG tablet Take 10 mg by mouth daily.   Yes [provider]  cetirizine (ZYRTEC) 10 MG tablet Take 10 mg by mouth as needed for allergies.  Yes [provider]  levothyroxine (SYNTHROID, LEVOTHROID) 88 MCG tablet Take 88 mcg by mouth daily before breakfast.   Yes [provider]  mometasone-formoterol (DULERA) 100-5 MCG/ACT AERO Inhale 2 puffs into the lungs as needed.    Yes [provider]  Omega-3 300 MG CAPS Take 900 mg by mouth daily.   Yes [provider]  saw palmetto 500 MG capsule Take 500 mg by mouth daily.   Yes [provider]    Family History Family History  Problem Relation Age of Onset  . Diabetes Mellitus II Mother     Social History Social History   Tobacco Use  . Smoking status: Former Smoker    Last  attempt to quit: 03/30/1965    Years since quitting: 52.2  . Smokeless tobacco: Current User    Types: Chew  Substance Use Topics  . Alcohol use: No    Comment: Rarely  . Drug use: No     Allergies   Aleve [naproxen sodium]; Codeine; and Penicillin g benzathine   Review of Systems Review of Systems  Constitutional: Negative for appetite change, chills, diaphoresis, fatigue and fever.  HENT: Negative for mouth sores, sore throat and trouble swallowing.   Eyes: Negative for visual disturbance.  Respiratory: Positive for shortness of breath. Negative for cough, chest tightness and wheezing.   Cardiovascular: Negative for chest pain.  Gastrointestinal: Negative for abdominal distention, abdominal pain, diarrhea, nausea and vomiting.  Endocrine: Negative for polydipsia, polyphagia and polyuria.  Genitourinary: Negative for dysuria, frequency and hematuria.  Musculoskeletal: Negative for gait problem.  Skin: Negative for color change, pallor and rash.  Neurological: Positive for light-headedness. Negative for dizziness, syncope and headaches.  Hematological: Does not bruise/bleed easily.  Psychiatric/Behavioral: Negative for behavioral problems and confusion.     Physical Exam Updated Vital Signs BP (!) 150/99 (BP Location: Right Arm)   Pulse (!) 34   Temp 97.8 F (36.6 C) (Oral)   Resp 16   Ht 5\' 10"  (1.778 m)   Wt 74.1 kg (163 lb 5.8 oz)   SpO2 92%   BMI 23.44 kg/m   Physical Exam  Constitutional: He is oriented to person, place, and time. He appears well-developed and well-nourished. No distress.  HENT:  Head: Normocephalic.  Eyes: Conjunctivae are normal. Pupils are equal, round, and reactive to light. No scleral icterus.  Neck: Normal range of motion. Neck supple. No thyromegaly present.  Cardiovascular: Exam reveals no gallop and no friction rub.  No murmur heard. Bradycardic rhythm.  P waves noted.  Appears to be a second-degree Mobitz 2 rhythm.  Pulmonary/Chest:  Effort normal and breath sounds normal. No respiratory distress. He has no wheezes. He has no rales.  Abdominal: Soft. Bowel sounds are normal. He exhibits no distension. There is no tenderness. There is no rebound.  Musculoskeletal: Normal range of motion.  Neurological: He is alert and oriented to person, place, and time.  Skin: Skin is warm and dry. No rash noted.  Psychiatric: He has a normal mood and affect. His behavior is normal.     ED Treatments / Results  Labs (all labs ordered are listed, but only abnormal results are displayed) Labs Reviewed  BASIC METABOLIC PANEL - Abnormal; Notable for the following components:      Result Value   Chloride 100 (*)    Glucose, Bld 126 (*)    BUN 21 (*)    Creatinine, Ser 1.44 (*)    Calcium 8.8 (*)  GFR calc non Af Amer 42 (*)    GFR calc Af Amer 48 (*)    All other components within normal limits  CBC - Abnormal; Notable for the following components:   Hemoglobin 12.5 (*)    HCT 38.5 (*)    Platelets 126 (*)    All other components within normal limits  COMPREHENSIVE METABOLIC PANEL - Abnormal; Notable for the following components:   Glucose, Bld 128 (*)    BUN 21 (*)    Creatinine, Ser 1.38 (*)    Albumin 3.3 (*)    GFR calc non Af Amer 44 (*)    GFR calc Af Amer 51 (*)    All other components within normal limits  CBC WITH DIFFERENTIAL/PLATELET - Abnormal; Notable for the following components:   RBC 4.07 (*)    Hemoglobin 11.7 (*)    HCT 36.4 (*)    Platelets 115 (*)    All other components within normal limits  MRSA PCR SCREENING  MAGNESIUM  PHOSPHORUS  APTT  PROTIME-INR  CBC  TSH  BASIC METABOLIC PANEL  I-STAT TROPONIN, ED    EKG  EKG Interpretation  Date/Time:  Sunday May 30 2017 17:13:37 EST Ventricular Rate:  35 PR Interval:  230 QRS Duration: 140 QT Interval:  543 QTC Calculation: 415 R Axis:   67 Text Interpretation:  Second degree Type 2 AVB Prolonged PR interval Right bundle branch block  Confirmed by Tanna Furry [phone]) on 05/30/2017 5:53:45 PM       Radiology Dg Chest Port 1 View  Result Date: 05/30/2017 CLINICAL DATA:  Shortness of breath. EXAM: PORTABLE CHEST 1 VIEW COMPARISON:  Chest radiograph 03/30/2016 FINDINGS: Pacer pads overlie the patient. Monitoring leads overlie the patient. Stable cardiomegaly. Aortic atherosclerosis. Apical emphysematous change. Bibasilar scarring/atelectasis. No pleural effusion or pneumothorax. IMPRESSION: No acute cardiopulmonary process.  Stable chest radiograph. Electronically Signed   By: Lovey Newcomer M.D.   On: 05/30/2017 18:33    Procedures Procedures (including critical care time)  Medications Ordered in ED Medications  aspirin EC tablet 81 mg (not administered)  atorvastatin (LIPITOR) tablet 10 mg (not administered)  levothyroxine (SYNTHROID, LEVOTHROID) tablet 88 mcg (not administered)  heparin injection 5,000 Units (not administered)  0.9 %  sodium chloride infusion ( Intravenous New Bag/Given 05/30/17 2155)     Initial Impression / Assessment and Plan / ED Course  I have reviewed the triage vital signs and the nursing notes.  Pertinent labs & imaging results that were available during my care of the patient were reviewed by me and considered in my medical decision making (see chart for details).    Electrolytes without acute abnormalities.  Not on any AV blocking medications.  I discussed case with Dr. Radford Pax.  She is here evaluating the patient for admission and consideration for PPM  Final Clinical Impressions(s) / ED Diagnoses   Final diagnoses:  Bradycardia  Second degree AV block, Mobitz type II    ED Discharge Orders    None       Tanna Furry, MD 05/30/17 2325

## 2017-05-30 NOTE — H&P (Addendum)
Admit date: 05/30/2017 Referring Physician: Dr. Jeneen Rinks Primary Cardiologist: Dr. Marlou Porch Chief complaint/reason for admission:second degree AV block  HPI: Jay [name] is a 84 y.o. male who is being seen today for the evaluation of symptomatic bradycardia with second degree AV block  at the request of Dr. Jeneen Rinks.  This is an 84yo male with a history of hyperlipidemia, chronic RBBB and remote AVR with a #23 Edwards pericardial tissue valve.  He also has a history of ruptured AAA s/p stent graft repair and mild nonobstructive ASCAD with 30% prox LAD and aneurysmal dilatation of the Lcx by remote cath. Over the past   2 days he has had problems with feeling SOB and dizziness as well as numbness and tingling in his face and hands.  He presented to the ER today and was found to have a HR in the 30's.     PMH:    Past Medical History:  Diagnosis Date  . Allergic rhinitis    ,RAD wheezing  . Cataracts, bilateral   . Diverticulosis    , ACBE, flex, in 2003  . Dyslipidemia   . Fracture of left clavicle    , Hx  . Hearing loss    , Bilateral  . Hypothyroidism   . Insomnia   . Kidney stone   . OA (optic atrophy)   . Osteoarthritis   . Pelvic fracture (HCC)    , Hx  . RBBB (right bundle branch block)    , seen on EKG  . Varicose veins     PSH:    Past Surgical History:  Procedure Laterality Date  . ABDOMINAL AORTOGRAM N/A 06/22/2016   Procedure: Abdominal Aortogram;  Surgeon: Katha Cabal, MD;  Location: [facility];  Service: Cardiovascular;  Laterality: N/A;  . AORTIC INTERVENTION N/A 06/22/2016   Procedure: Aortic Intervention;  Surgeon: Katha Cabal, MD;  Location: [facility];  Service: Cardiovascular;  Laterality: N/A;  . AORTIC VALVE REPLACEMENT  2011  . CATARACT EXTRACTION W/PHACO Right 02/17/2016   Procedure: CATARACT EXTRACTION PHACO AND INTRAOCULAR LENS PLACEMENT (IOC);  Surgeon: Ronnell Freshwater, MD;  Location: [facility];   Service: Ophthalmology;  Laterality: Right;  RIGHT  . INGUINAL HERNIA REPAIR Bilateral   . Orchectomy Right   . THYROIDECTOMY      ALLERGIES:   Aleve [naproxen sodium]; Codeine; and Penicillin g benzathine  Prior to Admit Meds:   (Not in a hospital admission) Family HX:    Family History  Problem Relation Age of Onset  . Diabetes Mellitus II Mother    Social HX:    Social History   Socioeconomic History  . Marital status: Married    Spouse name: Not on file  . Number of children: Not on file  . Years of education: Not on file  . Highest education level: Not on file  Social Needs  . Financial resource strain: Not on file  . Food insecurity - worry: Not on file  . Food insecurity - inability: Not on file  . Transportation needs - medical: Not on file  . Transportation needs - non-medical: Not on file  Occupational History  . Not on file  Tobacco Use  . Smoking status: Former Smoker    Last attempt to quit: 03/30/1965    Years since quitting: 52.2  . Smokeless tobacco: Current User    Types: Chew  Substance and Sexual Activity  . Alcohol use: No    Comment: Rarely  .  Drug use: No  . Sexual activity: Not on file  Other Topics Concern  . Not on file  Social History Narrative  . Not on file     ROS:  All ROS were addressed and are negative except what is stated in the HPI  PHYSICAL EXAM Vitals:   05/30/17 1612 05/30/17 1715  BP: (!) 160/54 (!) 135/48  Pulse:  (!) 33  Resp: 18 18  Temp: (!) 97.5 F (36.4 C)   SpO2: 96% 92%   General: Well developed, well nourished, in no acute distress Head: Eyes PERRLA, No xanthomas.   Normal cephalic and atramatic  Lungs:   Clear bilaterally to auscultation and percussion. Heart:   HRRR S1 S2 Pulses are 2+ & equal.            No carotid bruit. No JVD.  No abdominal bruits. No femoral bruits. Abdomen: Bowel sounds are positive, abdomen soft and non-tender without masses or                  Hernia's noted. Msk:  Back normal,  normal gait. Normal strength and tone for age. Extremities:   No clubbing, cyanosis or edema.  DP +1 Neuro: Alert and oriented X 3. Psych:  Good affect, responds appropriately   Labs:   Lab Results  Component Value Date   WBC 6.4 05/30/2017   HGB 12.5 (L) 05/30/2017   HCT 38.5 (L) 05/30/2017   MCV 88.9 05/30/2017   PLT 126 (L) 05/30/2017   Recent Labs  Lab 05/30/17 1623  NA 135  K 4.2  CL 100*  CO2 25  BUN 21*  CREATININE 1.44*  CALCIUM 8.8*  GLUCOSE 126*   Lab Results  Component Value Date   TROPONINI <0.03 03/30/2016   No results found for: PTT Lab Results  Component Value Date   INR 1.16 06/22/2016   INR 1.12 03/30/2016   INR 1.84 (H) 10/31/2009     Lab Results  Component Value Date   CHOL 135 03/30/2016   Lab Results  Component Value Date   HDL 38 (L) 03/30/2016   Lab Results  Component Value Date   LDLCALC 83 03/30/2016   Lab Results  Component Value Date   TRIG 68 03/30/2016   Lab Results  Component Value Date   CHOLHDL 3.6 03/30/2016   No results found for: LDLDIRECT    Radiology:  No results found.   Telemetry     sinus bradycardia with HR 35bpm with second degree AV block and RBBB- Personally Reviewed  ECG     sinus bradycardia with HR 35bpm with second degree AV block and RBBB - Personally Reviewed   ASSESSMENT/PLAN:    1.  Symptomatic bradycardia with HR 35bpm with second degree AV block and RBBB.  He has been having problems with dizziness for 5 months but over past few days has had onset of DOE, weakness and fatigue and worsening dizziness.  He is not on any AVN blocking agents and likely related to age related degenertation of the conduction system as he has had a chronic RBBB for some time.  - will admit to stepdown 2H - Tele monitoring - Check TSH - Keep Zol pads on - He is hemodynamically stable so will not place temp pacer at this time but if he becomes sx or BP drops will need temp pacer - 2D echo in am to assess  LVF - make NPO after MN for PPM in am  2.  HTN -  BP is stable at 135/48 - 160/12mmHg despite heart block.    3.  Hyperlipidemia - continue atorvastatin 10mg  daily.    4.  Mild nonobstructive ASCAD 30% prox LAD by cath 2011 - he denies any anginal CP.  He has had recent SOB likely related to bradycardia.  - continue ASA 81mg  daily and statin.   5.  H/O pericardial AVR 2011  6.  Ruptured AAA-  s/p stent graft 05/2016 - continue ASA and statin.   7.  AKI likely related to underlying heart block - mild IVF resuscitation - repeat BMET in am  Fransico Him, MD  05/30/2017  5:51 PM

## 2017-05-31 ENCOUNTER — Inpatient Hospital Stay (HOSPITAL_COMMUNITY)
Admission: EM | Disposition: A | Discharge status: Home Health | Payer: Self-pay | Source: Home / Self Care | Attending: Internal Medicine

## 2017-05-31 ENCOUNTER — Inpatient Hospital Stay (HOSPITAL_COMMUNITY): Payer: Medicare Other

## 2017-05-31 ENCOUNTER — Observation Stay (HOSPITAL_BASED_OUTPATIENT_CLINIC_OR_DEPARTMENT_OTHER): Payer: Medicare Other

## 2017-05-31 ENCOUNTER — Encounter (HOSPITAL_COMMUNITY)
Admission: EM | Disposition: A | Discharge status: Home Health | Payer: Self-pay | Source: Home / Self Care | Attending: Internal Medicine

## 2017-05-31 DIAGNOSIS — M1990 Unspecified osteoarthritis, unspecified site: Secondary | ICD-10-CM | POA: Diagnosis present

## 2017-05-31 DIAGNOSIS — I441 Atrioventricular block, second degree: Secondary | ICD-10-CM | POA: Diagnosis not present

## 2017-05-31 DIAGNOSIS — Z87891 Personal history of nicotine dependence: Secondary | ICD-10-CM | POA: Diagnosis not present

## 2017-05-31 DIAGNOSIS — Z885 Allergy status to narcotic agent status: Secondary | ICD-10-CM | POA: Diagnosis not present

## 2017-05-31 DIAGNOSIS — Z880 Allergy status to penicillin: Secondary | ICD-10-CM | POA: Diagnosis not present

## 2017-05-31 DIAGNOSIS — I4510 Unspecified right bundle-branch block: Secondary | ICD-10-CM | POA: Diagnosis present

## 2017-05-31 DIAGNOSIS — R0602 Shortness of breath: Secondary | ICD-10-CM | POA: Diagnosis not present

## 2017-05-31 DIAGNOSIS — R441 Visual hallucinations: Secondary | ICD-10-CM | POA: Diagnosis present

## 2017-05-31 DIAGNOSIS — I462 Cardiac arrest due to underlying cardiac condition: Secondary | ICD-10-CM | POA: Diagnosis not present

## 2017-05-31 DIAGNOSIS — Z950 Presence of cardiac pacemaker: Secondary | ICD-10-CM | POA: Diagnosis not present

## 2017-05-31 DIAGNOSIS — H9193 Unspecified hearing loss, bilateral: Secondary | ICD-10-CM | POA: Diagnosis present

## 2017-05-31 DIAGNOSIS — I10 Essential (primary) hypertension: Secondary | ICD-10-CM | POA: Diagnosis present

## 2017-05-31 DIAGNOSIS — Z888 Allergy status to other drugs, medicaments and biological substances status: Secondary | ICD-10-CM | POA: Diagnosis not present

## 2017-05-31 DIAGNOSIS — Z953 Presence of xenogenic heart valve: Secondary | ICD-10-CM | POA: Diagnosis not present

## 2017-05-31 DIAGNOSIS — I442 Atrioventricular block, complete: Secondary | ICD-10-CM | POA: Diagnosis present

## 2017-05-31 DIAGNOSIS — R001 Bradycardia, unspecified: Secondary | ICD-10-CM | POA: Diagnosis present

## 2017-05-31 DIAGNOSIS — I951 Orthostatic hypotension: Secondary | ICD-10-CM | POA: Diagnosis present

## 2017-05-31 DIAGNOSIS — J811 Chronic pulmonary edema: Secondary | ICD-10-CM | POA: Diagnosis present

## 2017-05-31 DIAGNOSIS — E785 Hyperlipidemia, unspecified: Secondary | ICD-10-CM | POA: Diagnosis present

## 2017-05-31 DIAGNOSIS — Z79890 Hormone replacement therapy (postmenopausal): Secondary | ICD-10-CM | POA: Diagnosis not present

## 2017-05-31 DIAGNOSIS — Z87442 Personal history of urinary calculi: Secondary | ICD-10-CM | POA: Diagnosis not present

## 2017-05-31 DIAGNOSIS — Z9841 Cataract extraction status, right eye: Secondary | ICD-10-CM | POA: Diagnosis not present

## 2017-05-31 DIAGNOSIS — I361 Nonrheumatic tricuspid (valve) insufficiency: Secondary | ICD-10-CM | POA: Diagnosis not present

## 2017-05-31 DIAGNOSIS — Z961 Presence of intraocular lens: Secondary | ICD-10-CM | POA: Diagnosis present

## 2017-05-31 DIAGNOSIS — G4700 Insomnia, unspecified: Secondary | ICD-10-CM | POA: Diagnosis present

## 2017-05-31 DIAGNOSIS — J810 Acute pulmonary edema: Secondary | ICD-10-CM | POA: Diagnosis not present

## 2017-05-31 DIAGNOSIS — N179 Acute kidney failure, unspecified: Secondary | ICD-10-CM | POA: Diagnosis present

## 2017-05-31 DIAGNOSIS — Z79899 Other long term (current) drug therapy: Secondary | ICD-10-CM | POA: Diagnosis not present

## 2017-05-31 HISTORY — PX: TEMPORARY PACEMAKER: CATH118268

## 2017-05-31 HISTORY — PX: PACEMAKER IMPLANT: EP1218

## 2017-05-31 LAB — BASIC METABOLIC PANEL
Anion gap: 11 (ref 5–15)
BUN: 24 mg/dL — ABNORMAL HIGH (ref 6–20)
CO2: 22 mmol/L (ref 22–32)
Calcium: 8.5 mg/dL — ABNORMAL LOW (ref 8.9–10.3)
Chloride: 103 mmol/L (ref 101–111)
Creatinine, Ser: 1.55 mg/dL — ABNORMAL HIGH (ref 0.61–1.24)
GFR calc Af Amer: 44 mL/min — ABNORMAL LOW (ref >60)
GFR calc non Af Amer: 38 mL/min — ABNORMAL LOW (ref >60)
Glucose, Bld: 95 mg/dL (ref 65–99)
Potassium: 3.6 mmol/L (ref 3.5–5.1)
Sodium: 136 mmol/L (ref 135–145)

## 2017-05-31 LAB — SURGICAL PCR SCREEN
MRSA, PCR: NEGATIVE
Staphylococcus aureus: NEGATIVE

## 2017-05-31 LAB — MRSA PCR SCREENING: MRSA by PCR: NEGATIVE

## 2017-05-31 LAB — ECHOCARDIOGRAM COMPLETE
Height: 70 in
Weight: 2613.77 oz

## 2017-05-31 SURGERY — TEMPORARY PACEMAKER
Anesthesia: LOCAL

## 2017-05-31 SURGERY — PACEMAKER IMPLANT

## 2017-05-31 MED ORDER — ONDANSETRON HCL 4 MG/2ML IJ SOLN
INTRAMUSCULAR | Status: AC
  Filled 2017-05-31: qty 2

## 2017-05-31 MED ORDER — DIPHENHYDRAMINE HCL 50 MG/ML IJ SOLN
12.5000 mg | Freq: Once | INTRAMUSCULAR | Status: AC
  Administered 2017-05-31: 12.5 mg via INTRAVENOUS
  Filled 2017-05-31: qty 1

## 2017-05-31 MED ORDER — DIPHENHYDRAMINE HCL 50 MG/ML IJ SOLN
INTRAMUSCULAR | Status: DC | PRN
  Administered 2017-05-31: 25 mg via INTRAVENOUS

## 2017-05-31 MED ORDER — ONDANSETRON HCL 4 MG/2ML IJ SOLN: 4.0000 mg | Freq: Four times a day (QID) | INTRAMUSCULAR | Status: DC | PRN

## 2017-05-31 MED ORDER — ORAL CARE MOUTH RINSE
15.0000 mL | Freq: Two times a day (BID) | OROMUCOSAL | Status: DC
  Administered 2017-05-31 – 2017-06-02 (×3): 15 mL via OROMUCOSAL

## 2017-05-31 MED ORDER — SODIUM CHLORIDE 0.9 % IR SOLN
Status: AC
  Filled 2017-05-31: qty 2

## 2017-05-31 MED ORDER — SODIUM CHLORIDE 0.9% FLUSH
3.0000 mL | INTRAVENOUS | Status: DC | PRN
Start: 2017-05-31 — End: 2017-05-31

## 2017-05-31 MED ORDER — DOPAMINE-DEXTROSE 3.2-5 MG/ML-% IV SOLN
0.0000 ug/kg/min | INTRAVENOUS | Status: DC
  Administered 2017-05-31: 20 ug/kg/min via INTRAVENOUS
  Filled 2017-05-31: qty 250

## 2017-05-31 MED ORDER — CEFAZOLIN SODIUM-DEXTROSE 2-4 GM/100ML-% IV SOLN
INTRAVENOUS | Status: AC
  Filled 2017-05-31: qty 100

## 2017-05-31 MED ORDER — CHLORHEXIDINE GLUCONATE 4 % EX LIQD
60.0000 mL | Freq: Once | CUTANEOUS | Status: AC
  Administered 2017-05-31: 4 via TOPICAL

## 2017-05-31 MED ORDER — CEFAZOLIN SODIUM-DEXTROSE 1-4 GM/50ML-% IV SOLN
INTRAVENOUS | Status: DC | PRN
  Administered 2017-05-31: 1 g via INTRAVENOUS

## 2017-05-31 MED ORDER — FENTANYL CITRATE (PF) 100 MCG/2ML IJ SOLN
INTRAMUSCULAR | Status: AC
  Filled 2017-05-31: qty 2

## 2017-05-31 MED ORDER — VANCOMYCIN HCL IN DEXTROSE 1-5 GM/200ML-% IV SOLN
1000.0000 mg | Freq: Two times a day (BID) | INTRAVENOUS | Status: AC
  Administered 2017-06-01: 1000 mg via INTRAVENOUS
  Filled 2017-05-31: qty 200

## 2017-05-31 MED ORDER — SODIUM CHLORIDE 0.9 % IV SOLN
INTRAVENOUS | Status: DC
  Administered 2017-05-31: 12:00:00 via INTRAVENOUS

## 2017-05-31 MED ORDER — VANCOMYCIN HCL IN DEXTROSE 1-5 GM/200ML-% IV SOLN
1000.0000 mg | INTRAVENOUS | Status: AC
  Administered 2017-05-31: 1000 mg via INTRAVENOUS
  Filled 2017-05-31: qty 200

## 2017-05-31 MED ORDER — LIDOCAINE HCL (PF) 1 % IJ SOLN
INTRAMUSCULAR | Status: DC | PRN
  Administered 2017-05-31: 20 mL

## 2017-05-31 MED ORDER — ACETAMINOPHEN 325 MG PO TABS: 325.0000 mg | ORAL_TABLET | ORAL | Status: DC | PRN

## 2017-05-31 MED ORDER — LIDOCAINE HCL (PF) 1 % IJ SOLN
INTRAMUSCULAR | Status: AC
  Filled 2017-05-31: qty 30

## 2017-05-31 MED ORDER — ONDANSETRON HCL 4 MG/2ML IJ SOLN
4.0000 mg | Freq: Once | INTRAMUSCULAR | Status: AC
  Administered 2017-05-31: 4 mg via INTRAVENOUS

## 2017-05-31 MED ORDER — ATROPINE SULFATE 1 MG/10ML IJ SOSY
PREFILLED_SYRINGE | INTRAMUSCULAR | Status: AC
  Administered 2017-05-31: 1 mg
  Filled 2017-05-31: qty 10

## 2017-05-31 MED ORDER — SODIUM CHLORIDE 0.9 % IV SOLN: 250.0000 mL | INTRAVENOUS | Status: DC

## 2017-05-31 MED ORDER — HEPARIN (PORCINE) IN NACL 2-0.9 UNIT/ML-% IJ SOLN
INTRAMUSCULAR | Status: AC
  Filled 2017-05-31: qty 500

## 2017-05-31 MED ORDER — DIPHENHYDRAMINE HCL 50 MG/ML IJ SOLN
INTRAMUSCULAR | Status: AC
  Filled 2017-05-31: qty 1

## 2017-05-31 MED ORDER — GENTAMICIN SULFATE 40 MG/ML IJ SOLN
80.0000 mg | INTRAMUSCULAR | Status: AC
  Administered 2017-05-31: 80 mg

## 2017-05-31 MED ORDER — SODIUM CHLORIDE 0.9% FLUSH: 3.0000 mL | Freq: Two times a day (BID) | INTRAVENOUS | Status: DC

## 2017-05-31 MED ORDER — ATROPINE SULFATE 1 MG/10ML IJ SOSY
PREFILLED_SYRINGE | INTRAMUSCULAR | Status: AC
  Administered 2017-05-31: 14:00:00
  Filled 2017-05-31: qty 10

## 2017-05-31 MED ORDER — CHLORHEXIDINE GLUCONATE 4 % EX LIQD
60.0000 mL | Freq: Once | CUTANEOUS | Status: AC
  Administered 2017-05-31: 4 via TOPICAL
  Filled 2017-05-31: qty 60

## 2017-05-31 MED ORDER — FENTANYL CITRATE (PF) 100 MCG/2ML IJ SOLN
INTRAMUSCULAR | Status: DC | PRN
  Administered 2017-05-31 (×2): 12.5 ug via INTRAVENOUS

## 2017-05-31 MED ORDER — HEPARIN (PORCINE) IN NACL 2-0.9 UNIT/ML-% IJ SOLN
INTRAMUSCULAR | Status: AC | PRN
  Administered 2017-05-31: 500 mL

## 2017-05-31 MED ORDER — LIDOCAINE HCL (PF) 1 % IJ SOLN
INTRAMUSCULAR | Status: DC | PRN
  Administered 2017-05-31: 60 mL

## 2017-05-31 SURGICAL SUPPLY — 4 items
CABLE ADAPT CONN TEMP 6FT (ADAPTER) ×1 IMPLANT
CATH S G BIP PACING (SET/KITS/TRAYS/PACK) ×1 IMPLANT
PACK CARDIAC CATHETERIZATION (CUSTOM PROCEDURE TRAY) ×1 IMPLANT
SHEATH PINNACLE 6F 10CM (SHEATH) ×1 IMPLANT

## 2017-05-31 SURGICAL SUPPLY — 7 items
CABLE SURGICAL S-101-97-12 (CABLE) ×1 IMPLANT
LEAD TENDRIL MRI 52CM LPA1200M (Lead) ×1 IMPLANT
LEAD TENDRIL MRI 58CM LPA1200M (Lead) ×1 IMPLANT
PACEMAKER ASSURITY DR-RF (Pacemaker) ×1 IMPLANT
PAD DEFIB LIFELINK (PAD) ×1 IMPLANT
SHEATH CLASSIC 8F 25CM (SHEATH) ×2 IMPLANT
TRAY PACEMAKER INSERTION (PACKS) ×1 IMPLANT

## 2017-05-31 NOTE — Progress Notes (Signed)
Pt rang call bell for assistance, upon entering room, pt c/o of feeling SOB and neck pain. BP and HR elevated, O2 sats upper 90s on RA. Pt stated "I can't get comfortable." Pt became more agitated and anxious, starting to hyperventilate. Encouraged pt to slow down his breathing and take slow breathes. Family at bedside trying to comfort pt as well. Will notified MD on call.

## 2017-05-31 NOTE — Progress Notes (Signed)
Echocardiogram 2D Echocardiogram has been performed.  Jay [name] 05/31/2017, 10:17 AM

## 2017-05-31 NOTE — Progress Notes (Signed)
Pt began to get restless again, BP and RR increasing. Tried to reposition pt to get comfortable and get information from pt on ways to help him get comfortable. Asked family has this happened before, family said it happens sometime but not this bad. Pt kept saying "I just want something to knock me out, I can't get comfortable." Paged MD received orders to give benadryl to help pt sleep.

## 2017-05-31 NOTE — Progress Notes (Addendum)
Received orders to give 1 time dose xanax, see MAR. Pt started to complain of chest tightness, Placed 2L O2 nasal cannula. EKG obtained. MD on call made aware, no new orders received at this time.

## 2017-05-31 NOTE — Consult Note (Addendum)
Cardiology Consultation:   Patient ID: Jay [name]; [id number]; Sep 25, 1925   Admit date: 05/30/2017 Date of Consult: 05/31/2017  Primary Care Provider: Wenda Low, MD Primary Cardiologist: Dr. Marlou Porch    Patient Profile:   Jay [name] is a 84 y.o. male with a hx of AVR (bioprosthetic) in 2011, AAA ruptire/repair 05/2016, RBBB, no known obstructive CAD, HLD, hx of orthostatic hypotension who is being seen today for the evaluation of PPM implant at the request of Dr. Radford Pax.  History of Present Illness:   Jay [name] was admitted yesterday with c/o dizziness, SOB.  Noted to have advanced heart block, Mobitz II with V rates 30's, ARI felt 2/2 to bradycardia, othewise with stable BP, admitted to ICU with plans for AM EP evaluation.  LABS K+ 3.6 Mag 2.4 BUN/Creat 24/1.55 Poc Trop 0.00 WBC 7.4 H/H 11/36 Plts 115 TSH 0.836  The patient/family at beds side (wife/daughter) say that since his AVR years ago he has occasionally mentioned some dizziness here and there, and over time he  really didn't may much attention to it.  In the last week perhaps he had started mentioning feeling dizzy, and one evening had a cold sweat without other symptoms.  He has not been ill otherwise, no cough/cold/flu.  No CP, no palpitations.  2 days ago or so he mentioned he felt dizzy, and different then his usual symptom, his son had him in the car to come to the hospital, though the patient had to use the BR, and then reported feeling better and stayed home.  Yesterday he again felt an unusual dizziness, breathless and was brought in.  No near syncope or syncope are reported.  Home and inpatient meds reviewed, no nodal blocking/rate limiting medicines noted  Past Medical History:  Diagnosis Date  . Allergic rhinitis    ,RAD wheezing  . Cataracts, bilateral   . Diverticulosis    , ACBE, flex, in 2003  . Dyslipidemia   . Fracture of left clavicle    , Hx  . Hearing loss    , Bilateral  .  Hypothyroidism   . Insomnia   . Kidney stone   . OA (optic atrophy)   . Osteoarthritis   . Pelvic fracture (HCC)    , Hx  . RBBB (right bundle branch block)    , seen on EKG  . Varicose veins     Past Surgical History:  Procedure Laterality Date  . ABDOMINAL AORTOGRAM N/A 06/22/2016   Procedure: Abdominal Aortogram;  Surgeon: Katha Cabal, MD;  Location: [facility];  Service: Cardiovascular;  Laterality: N/A;  . AORTIC INTERVENTION N/A 06/22/2016   Procedure: Aortic Intervention;  Surgeon: Katha Cabal, MD;  Location: [facility];  Service: Cardiovascular;  Laterality: N/A;  . AORTIC VALVE REPLACEMENT  2011  . CATARACT EXTRACTION W/PHACO Right 02/17/2016   Procedure: CATARACT EXTRACTION PHACO AND INTRAOCULAR LENS PLACEMENT (IOC);  Surgeon: Ronnell Freshwater, MD;  Location: [facility];  Service: Ophthalmology;  Laterality: Right;  RIGHT  . INGUINAL HERNIA REPAIR Bilateral   . Orchectomy Right   . THYROIDECTOMY         Inpatient Medications: Scheduled Meds: . aspirin EC  81 mg Oral Daily  . atorvastatin  10 mg Oral q1800  . heparin  5,000 Units Subcutaneous Q8H  . levothyroxine  88 mcg Oral QAC breakfast   Continuous Infusions: . sodium chloride 50 mL/hr at 05/30/17 2155   PRN Meds: acetaminophen  Allergies:  Allergies  Allergen Reactions  . Aleve [Naproxen Sodium] Hives  . Codeine Other (See Comments)    unknown  . Penicillin G Benzathine     Pt doesn't remember reaction    Social History:   Social History   Socioeconomic History  . Marital status: Married    Spouse name: Not on file  . Number of children: Not on file  . Years of education: Not on file  . Highest education level: Not on file  Social Needs  . Financial resource strain: Not on file  . Food insecurity - worry: Not on file  . Food insecurity - inability: Not on file  . Transportation needs - medical: Not on file  . Transportation needs -  non-medical: Not on file  Occupational History  . Not on file  Tobacco Use  . Smoking status: Former Smoker    Last attempt to quit: 03/30/1965    Years since quitting: 52.2  . Smokeless tobacco: Current User    Types: Chew  Substance and Sexual Activity  . Alcohol use: No    Comment: Rarely  . Drug use: No  . Sexual activity: Not on file  Other Topics Concern  . Not on file  Social History Narrative  . Not on file    Family History:   Family History  Problem Relation Age of Onset  . Diabetes Mellitus II Mother      ROS:  Please see the history of present illness.  All other ROS reviewed and negative.     Physical Exam/Data:   Vitals:   05/31/17 0600 05/31/17 0700 05/31/17 0748 05/31/17 0900  BP: (!) 103/47 (!) 110/49  (!) 168/53  Pulse: (!) 40 (!) 38  (!) 49  Resp: (!) 25 (!) 23  (!) 29  Temp:   98.1 F (36.7 C)   TempSrc:   Oral   SpO2: 96% 97%  96%  Weight:      Height:        Intake/Output Summary (Last 24 hours) at 05/31/2017 0934 Last data filed at 05/31/2017 0600 Gross per 24 hour  Intake 104.17 ml  Output 375 ml  Net -270.83 ml   Filed Weights   05/30/17 2140  Weight: 163 lb 5.8 oz (74.1 kg)   Body mass index is 23.44 kg/m.  General:  Well nourished, well developed, in no acute distress HEENT: normal Lymph: no adenopathy Neck: no JVD Endocrine:  No thyromegaly Vascular: No carotid bruits;   Cardiac: RRR, braycardic, soft SM, no gallops or rubs Lungs:  CTA b/l,  no wheezing, rhonchi or rales  Abd: soft, nontender Ext: no edema Musculoskeletal:  No deformities, age appropriate atrophy Skin: warm and dry  Neuro:   No gross focal abnormalities noted Psych:  Normal affect   EKG:  The EKG was personally reviewed and demonstrates:   2:1 AVBlock, 35bpm, RBBB CHB V rate 48bpm, RBBB Telemetry:  Telemetry was personally reviewed and demonstrates:   Often 2:1 AVblock, at times looks CHB, variable IVCD, known RBBB at baseline  Relevant CV  Studies:  03/30/16: TTE Study Conclusions - Left ventricle: The cavity size was normal. There was mild   concentric hypertrophy. Systolic function was normal. The   estimated ejection fraction was in the range of 60% to 65%. Wall   motion was normal; there were no regional wall motion   abnormalities. Features are consistent with a pseudonormal left   ventricular filling pattern, with concomitant abnormal relaxation   and increased filling  pressure (grade 2 diastolic dysfunction).   Doppler parameters are consistent with high ventricular filling   pressure. - Aortic valve: Poorly visualized. Mildly thickened, mildly   calcified leaflets. A bioprosthesis was present. There was mild   regurgitation. Valve area (VTI): 2.32 cm^2. Valve area (Vmax):   2.06 cm^2. Valve area (Vmean): 2 cm^2. - Mitral valve: Poorly visualized. Calcified annulus. Mildly   thickened leaflets . There was mild regurgitation.   Laboratory Data:  Chemistry Recent Labs  Lab 05/30/17 1623 05/30/17 2157 05/31/17 0636  NA 135 138 136  K 4.2 4.9 3.6  CL 100* 101 103  CO2 25 27 22   GLUCOSE 126* 128* 95  BUN 21* 21* 24*  CREATININE 1.44* 1.38* 1.55*  CALCIUM 8.8* 8.9 8.5*  GFRNONAA 42* 44* 38*  GFRAA 48* 51* 44*  ANIONGAP 10 10 11     Recent Labs  Lab 05/30/17 2157  PROT 6.7  ALBUMIN 3.3*  AST 24  ALT 20  ALKPHOS 76  BILITOT 0.6   Hematology Recent Labs  Lab 05/30/17 1623 05/30/17 2157  WBC 6.4 7.4  RBC 4.33 4.07*  HGB 12.5* 11.7*  HCT 38.5* 36.4*  MCV 88.9 89.4  MCH 28.9 28.7  MCHC 32.5 32.1  RDW 14.3 14.3  PLT 126* 115*   Cardiac EnzymesNo results for input(s): TROPONINI in the last 168 hours.  Recent Labs  Lab 05/30/17 1659  TROPIPOC 0.00    BNPNo results for input(s): BNP, PROBNP in the last 168 hours.  DDimer No results for input(s): DDIMER in the last 168 hours.  Radiology/Studies:  Dg Chest Port 1 View Result Date: 05/30/2017 CLINICAL DATA:  Shortness of breath. EXAM:  PORTABLE CHEST 1 VIEW COMPARISON:  Chest radiograph 03/30/2016 FINDINGS: Pacer pads overlie the patient. Monitoring leads overlie the patient. Stable cardiomegaly. Aortic atherosclerosis. Apical emphysematous change. Bibasilar scarring/atelectasis. No pleural effusion or pneumothorax. IMPRESSION: No acute cardiopulmonary process.  Stable chest radiograph. Electronically Signed   By: Lovey Newcomer M.D.   On: 05/30/2017 18:33    Assessment and Plan:   1. Advance heart block, Mobitz II/CHB, V rates 30's RBBB     No reversible causes identified  I have discussed PPM implant procedure, it's risks and benefits with the patient and family at bedside.  The patient (and family) would like to proceed.    Dr. Lovena Le will see the patient I have placed him on the EP schedule BP is stable Patient at supine has no symptoms, though patient was quite anxious last night apparently  2. Hx of AVR     No significant murmur is appreciated   For questions or updates, please contact [place] Please consult [email or web address] for contact info under Cardiology/STEMI.   Signed, Baldwin Jamaica, PA-C  05/31/2017 9:34 AM  EP Attending  Patient seen and examined. Agree with above. The patient is having progressive heart block with long periods of asystole, and has required transcutaneous pacing. I have recommended placing a temporary pacing wire followed by insertion of a DDD PM. I have discussed the emergent nature of the situation with the patient and his wife and they wish to proceed.  Mikle Bosworth.D.

## 2017-05-31 NOTE — Interval H&P Note (Signed)
History and Physical Interval Note:  05/31/2017 3:20 PM  Jay [name]  has presented today for surgery, with the diagnosis of hb  The various methods of treatment have been discussed with the patient and family. After consideration of risks, benefits and other options for treatment, the patient has consented to  Procedure(s): TEMPORARY PACEMAKER (N/A) as a surgical intervention .  The patient's history has been reviewed, patient examined, no change in status, stable for surgery.  I have reviewed the patient's chart and labs.  Questions were answered to the patient's satisfaction.    Pt with high grade heart block on schedule for PPM placement by Dr Lovena Le. He become unstable and transcutaneous pacing was required. He presents now for emergent temporary transvenous pacemaker placement. Emergency implied consent obtained.   Sherren Mocha MD

## 2017-05-31 NOTE — Progress Notes (Signed)
Pt now resting comfortable, family at bedside. No further complaints of chest pain or discomfort

## 2017-05-31 NOTE — Discharge Instructions (Signed)
° ° °  Supplemental Discharge Instructions for  °Pacemaker/Defibrillator Patients ° °Activity °No heavy lifting or vigorous activity with your left/right arm for 6 to 8 weeks.  Do not raise your left/right arm above your head for one week.  Gradually raise your affected arm as drawn below. ° °        °   06/04/17                       06/05/17                      06/06/17                  06/07/17 °__ ° °NO DRIVING for  1 week  ; you may begin driving on   06/07/17  . ° °WOUND CARE °- Keep the wound area clean and dry.  Do not get this area wet for one week. No showers for one week; you may shower on  06/07/17   . °- The tape/steri-strips on your wound will fall off; do not pull them off.  No bandage is needed on the site.  DO  NOT apply any creams, oils, or ointments to the wound area. °- If you notice any drainage or discharge from the wound, any swelling or bruising at the site, or you develop a fever > 101? F after you are discharged home, call the office at once. ° °Special Instructions °- You are still able to use cellular telephones; use the ear opposite the side where you have your pacemaker/defibrillator.  Avoid carrying your cellular phone near your device. °- When traveling through airports, show security personnel your identification card to avoid being screened in the metal detectors.  Ask the security personnel to use the hand wand. °- Avoid arc welding equipment, MRI testing (magnetic resonance imaging), TENS units (transcutaneous nerve stimulators).  Call the office for questions about other devices. °- Avoid electrical appliances that are in poor condition or are not properly grounded. °- Microwave ovens are safe to be near or to operate. ° °Additional information for defibrillator patients should your device go off: °- If your device goes off ONCE and you feel fine afterward, notify the device clinic nurses. °- If your device goes off ONCE and you do not feel well afterward, call 911. °- If your device  goes off TWICE, call 911. °- If your device goes off THREE times in one day, call 911. ° °DO NOT DRIVE YOURSELF OR A FAMILY MEMBER °WITH A DEFIBRILLATOR TO THE HOSPITAL--CALL 911. ° °

## 2017-05-31 NOTE — Progress Notes (Signed)
Pt began to have an increased episods of symptomatic pauses. Pt pre op completed and awaiting add-on for PPM per Dr Lovena Le. 1400 pt became unresponsive with low HR. Pacer pads applied, 1 amp Atropine given, & MD paged to bedside. F/C inserted due to Pt anxiety & inability to void. Pt externally capturing with 20MA at rate of 70. Dopamine initiated @ 74mcg. Pt nauseous with emesis. Zofran given. Education & emotional support ongoing for pt and family at beside. While waiting for Cath/ PPM. Was able to turn down rate of external pacer to 60 as backup. I remained at bedside and escorted pt to cath lab. Comfort measures provided to Pt assuring he was in Awesome, safe hands.

## 2017-06-01 ENCOUNTER — Encounter (HOSPITAL_COMMUNITY): Payer: Self-pay | Admitting: Internal Medicine

## 2017-06-01 DIAGNOSIS — J810 Acute pulmonary edema: Secondary | ICD-10-CM

## 2017-06-01 DIAGNOSIS — I442 Atrioventricular block, complete: Principal | ICD-10-CM

## 2017-06-01 LAB — BASIC METABOLIC PANEL
Anion gap: 8 (ref 5–15)
BUN: 33 mg/dL — ABNORMAL HIGH (ref 6–20)
CO2: 24 mmol/L (ref 22–32)
Calcium: 8.1 mg/dL — ABNORMAL LOW (ref 8.9–10.3)
Chloride: 103 mmol/L (ref 101–111)
Creatinine, Ser: 1.58 mg/dL — ABNORMAL HIGH (ref 0.61–1.24)
GFR calc Af Amer: 43 mL/min — ABNORMAL LOW (ref >60)
GFR calc non Af Amer: 37 mL/min — ABNORMAL LOW (ref >60)
Glucose, Bld: 97 mg/dL (ref 65–99)
Potassium: 4.8 mmol/L (ref 3.5–5.1)
Sodium: 135 mmol/L (ref 135–145)

## 2017-06-01 MED ORDER — FUROSEMIDE 10 MG/ML IJ SOLN
40.0000 mg | Freq: Once | INTRAMUSCULAR | Status: AC
  Administered 2017-06-01: 40 mg via INTRAVENOUS
  Filled 2017-06-01: qty 4

## 2017-06-01 MED FILL — Gentamicin Sulfate Inj 40 MG/ML: INTRAMUSCULAR | Qty: 80 | Status: AC

## 2017-06-01 NOTE — Progress Notes (Signed)
Report called to [place] on 3E. Family at bedside made aware of transfer. Sitter at bedside.

## 2017-06-01 NOTE — Progress Notes (Addendum)
Progress Note  Patient Name: Jay [name] Date of Encounter: 06/01/2017  Primary Cardiologist: No primary care provider on file.   Subjective   Daughters at bedside, pt denies any CP, palpitations, no overt SOB  Inpatient Medications    Scheduled Meds: . aspirin EC  81 mg Oral Daily  . atorvastatin  10 mg Oral q1800  . levothyroxine  88 mcg Oral QAC breakfast  . mouth rinse  15 mL Mouth Rinse BID   Continuous Infusions: . sodium chloride Stopped (06/01/17 0800)  . DOPamine Stopped (05/31/17 1830)   PRN Meds: acetaminophen, ondansetron (ZOFRAN) IV   Vital Signs    Vitals:   06/01/17 0700 06/01/17 0747 06/01/17 0800 06/01/17 0900  BP: 129/69  127/63 135/70  Pulse: 70  70 74  Resp: 19  18 (!) 23  Temp:  98.5 F (36.9 C)    TempSrc:  Oral    SpO2: 96%  96% 95%  Weight:      Height:        Intake/Output Summary (Last 24 hours) at 06/01/2017 1026 Last data filed at 06/01/2017 0900 Gross per 24 hour  Intake 880.76 ml  Output 635 ml  Net 245.76 ml   Filed Weights   05/30/17 2140  Weight: 163 lb 5.8 oz (74.1 kg)    Telemetry    SR/V paced - Personally Reviewed  ECG    No new EKGs - Personally Reviewed  Physical Exam   GEN: No acute distress.   Neck: No JVD Cardiac: RRR, no murmurs, rubs, or gallops.  Respiratory: slightly diminished at the bases. GI: Soft, nontender, non-distended  MS: No edema; No deformity. Neuro:  Nonfocal  Psych: Normal affect  PPM site is stable, no bleeding or hematoma R groin is stable, no bleeding/hematoma  Labs    Chemistry Recent Labs  Lab 05/30/17 2157 05/31/17 0636 06/01/17 0648  NA 138 136 135  K 4.9 3.6 4.8  CL 101 103 103  CO2 27 22 24   GLUCOSE 128* 95 97  BUN 21* 24* 33*  CREATININE 1.38* 1.55* 1.58*  CALCIUM 8.9 8.5* 8.1*  PROT 6.7  --   --   ALBUMIN 3.3*  --   --   AST 24  --   --   ALT 20  --   --   ALKPHOS 76  --   --   BILITOT 0.6  --   --   GFRNONAA 44* 38* 37*  GFRAA 51* 44* 43*  ANIONGAP  10 11 8      Hematology Recent Labs  Lab 05/30/17 1623 05/30/17 2157  WBC 6.4 7.4  RBC 4.33 4.07*  HGB 12.5* 11.7*  HCT 38.5* 36.4*  MCV 88.9 89.4  MCH 28.9 28.7  MCHC 32.5 32.1  RDW 14.3 14.3  PLT 126* 115*    Cardiac EnzymesNo results for input(s): TROPONINI in the last 168 hours.  Recent Labs  Lab 05/30/17 1659  TROPIPOC 0.00     BNPNo results for input(s): BNP, PROBNP in the last 168 hours.   DDimer No results for input(s): DDIMER in the last 168 hours.   Radiology    Dg Chest Port 1 View Result Date: 05/31/2017 CLINICAL DATA:  84 year old male post pacemaker placement. Subsequent encounter. EXAM: PORTABLE CHEST 1 VIEW COMPARISON:  05/30/2017 and 03/30/2016 chest x-ray. FINDINGS: Sequential pacemaker has been placed from left. Leads project at the level of the right atrium and right ventricle. No pneumothorax noted. Suspect pulmonary edema superimposed upon chronic lung  changes. Infiltrate less likely consideration. Calcified aorta. IMPRESSION: Sequential pacemaker has been placed from left. Leads project at the level of the right atrium and right ventricle. No pneumothorax noted. Suspect pulmonary edema superimposed upon chronic lung changes. Aortic Atherosclerosis (ICD10-I70.0). Electronically Signed   By: Genia Del M.D.   On: 05/31/2017 19:23    Cardiac Studies   03/30/16: TTE Study Conclusions - Left ventricle: The cavity size was normal. There was mild concentric hypertrophy. Systolic function was normal. The estimated ejection fraction was in the range of 60% to 65%. Wall motion was normal; there were no regional wall motion abnormalities. Features are consistent with a pseudonormal left ventricular filling pattern, with concomitant abnormal relaxation and increased filling pressure (grade 2 diastolic dysfunction). Doppler parameters are consistent with high ventricular filling pressure. - Aortic valve: Poorly visualized. Mildly thickened,  mildly calcified leaflets. A bioprosthesis was present. There was mild regurgitation. Valve area (VTI): 2.32 cm^2. Valve area (Vmax): 2.06 cm^2. Valve area (Vmean): 2 cm^2. - Mitral valve: Poorly visualized. Calcified annulus. Mildly thickened leaflets . There was mild regurgitation    Patient Profile     84 y.o. male with a hx of AVR (bioprosthetic) in 2011, AAA ruptire/repair 05/2016, RBBB, no known obstructive CAD, HLD, hx of orthostatic hypotension, admitted 05/30/17 with c/o dizziness, SOB.  Noted to have advanced heart block, Mobitz II/CHB with V rates 30's, ARI felt 2/2 to bradycardia, othewise with stable BP, admitted to ICU, saw by [place] yestareday with plans for PPM  Assessment & Plan    1. Advance heart block, Mobitz II/CHB, V rates 30's RBBB     No reversible causes identified    The patient was planned for PPM, prior to getting to the lab developed asystole and required transcutaneous pacing and temp wire placement emergently >>PPM  Now is s/p PPM yesterday with Dr. Molli Knock well this AM,  details of events yesterday are "fuzzy" for the patient PPM site and R groin site are stable, no bleeding/hematoma cxr post procedure without ptx, likely pulmonary edema Device check this morning with intact function.  Encourage OOB today Likely discharge in next day or 2  2. Hx of AVR     No significant murmur is appreciated  3. Pulm edema on his CXR     Give a single dose of lasix today     BMET stable from yesterday, follow    For questions or updates, please contact [place] Please consult [email or web address] for contact info under Cardiology/STEMI.      Signed, Baldwin Jamaica, PA-C  06/01/2017, 10:26 AM    EP Attending  Patient is much improved today, s/p PPM insertion. Mental status appears back to normal. Temp wire is out. PPM interogation demonstrates normal DDD PM function and cxr looks ok. He is still a little volume overloaded. He will be  transferred to a tele bad and amulate. Should be ready for DC in 24-48 hours.  Mikle Bosworth.D.

## 2017-06-02 LAB — BASIC METABOLIC PANEL
Anion gap: 10 (ref 5–15)
BUN: 40 mg/dL — ABNORMAL HIGH (ref 6–20)
CO2: 26 mmol/L (ref 22–32)
Calcium: 8.3 mg/dL — ABNORMAL LOW (ref 8.9–10.3)
Chloride: 99 mmol/L — ABNORMAL LOW (ref 101–111)
Creatinine, Ser: 1.57 mg/dL — ABNORMAL HIGH (ref 0.61–1.24)
GFR calc Af Amer: 43 mL/min — ABNORMAL LOW (ref >60)
GFR calc non Af Amer: 37 mL/min — ABNORMAL LOW (ref >60)
Glucose, Bld: 134 mg/dL — ABNORMAL HIGH (ref 65–99)
Potassium: 4.2 mmol/L (ref 3.5–5.1)
Sodium: 135 mmol/L (ref 135–145)

## 2017-06-02 MED ORDER — CEFUROXIME AXETIL 250 MG PO TABS: 250.0000 mg | ORAL_TABLET | Freq: Every day | ORAL | 0 refills | Status: AC

## 2017-06-02 NOTE — Discharge Summary (Addendum)
ELECTROPHYSIOLOGY PROCEDURE DISCHARGE SUMMARY    Patient ID: Jay [name],  MRN: [id number], DOB/AGE: 07/14/1925 84 y.o.  Admit date: 05/30/2017 Discharge date: 06/02/2017  Primary Care Physician: Wenda Low, MD  Primary Cardiologist: Dr. Marlou Porch Electrophysiologist: new to Dr. Lovena Le this admission  Primary Discharge Diagnosis:  1. complete heart block  Secondary Discharge Diagnosis:  1. AVR (bioprosthetic) 2011 2. AAA repair (05/2016) 3. Hx of orthostatic hypotension  Allergies  Allergen Reactions  . Aleve [Naproxen Sodium] Hives  . Codeine Other (See Comments)    unknown  . Penicillin G Benzathine     Pt doesn't remember reaction     Procedures This Admission:  1. 05/31/17, emergent pl;avcement of temp pacing wire 1.  Implantation of a SJM dual chamber PPM on 05/31/17 by Dr Lovena Le.  The patient received a Saint Jude dual-chamber pacemaker, serial [id number] Jude 52 cm active fixation atrial pacing lead, serial [id number], was advanced under fluoroscopic guidance to the atrium. The [facility] Jude 58 cm active fixation pacing lead serial number [id number]  There were no immediate post procedure complications. 2.  CXR on 06/01/17 demonstrated no pneumothorax status post device implantation.   Brief HPI: Jay [name] is a 84 y.o. male with a hx of AVR (bioprosthetic) in 2011, AAA ruptire/repair 05/2016, RBBB, no known obstructive CAD, HLD, hx of orthostatic hypotension, admitted with c/o dizziness, SOB.  Noted to have intermittent complete heart block, with V rates 30's, and long pauses, ARI felt 2/2 to bradycardia, othewise with stable BP, admitted to ICU planned for EP consultation in the AM   Hospital Course:  The patient was admitted and monitored closely in the ICU, noting Mobitz II with intermittent CHB, rates 30's, no reversible causes were identified and planned for PPM implant.   Prior to getting to the lab he became profoundly bradycardic with long periods of  asystole as well, associated with LOC, was externally paced maintaining BP/pulse with this and started on dopamine, he was brought emergently to the lab and had temp pacing wire placed, he then and underwent implantation of a PPM with details as outlined above.  He was monitored on telemetry throughout the rest of his stay noting SR/V paced rhythm.  He was treated with lasix for evidence of pulm edema on his CXR post  Implant, there was no PTX.  Device check post implant day one noted intact function.  Left chest was without hematoma or ecchymosis.  The patient remained AAO x3, though mentioned visual hallucinations, this was reported by family as the same thing that happened after his AVR that cleared up after getting home.  The patient ambulated post implant day one with RN assist, family (his wife particularly) is insistant that he not be sent to rehab or SNF, though agreed to PT evaluation for home needs. Wound care, arm mobility, and restrictions were reviewed with the patient (no family in the room) and provided in his AVS, routine EP follow up arranged.  The patient had a single episode of small amount of productive cough with bloody sputum, no recurrence.  The patient/staff note he blew his nose with very small specs of blood as well.  The patient tells me he thinks was his nose.   He does not have an ongoing cough, no over nose bleed. No fever or symptoms of illness, lungs are clear and did not repeat.   Will cover for possible bronchitis, he lists a PCN allergy (tellms me he had hives after a large  dose of IM PCN 50 years ago) though got IV ancef here without reaction. In d/w pharmacy would not expect reaction with Ceftin, recommends given age/GFR 250mg  QD x5 days  The patient was seen and examined this morning by Dr. Lovena Le, and considered stable for discharge to home once PT evaluation was completed and home health needs were established and arranged.  PT evaluation found no needs for home mobility  aids, felt ready for discharge.  Case management in d/w family felt [place] would be helpful to try and assure a safe transition to home given advanced age and they are agreeable.  Physical Exam: Vitals:   06/01/17 2044 06/02/17 0625 06/02/17 1027 06/02/17 1205  BP: (!) 99/51 (!) 100/48 137/64 (!) 143/74  Pulse: 71 86 67   Resp: 18  18   Temp: 98 F (36.7 C) 97.8 F (36.6 C) (!) 97.5 F (36.4 C)   TempSrc: Oral Oral Oral   SpO2: 100% 95% 94%   Weight: 156 lb 1.6 oz (70.8 kg) 156 lb (70.8 kg)    Height: 5\' 10"  (1.778 m)        Labs:   Lab Results  Component Value Date   WBC 7.4 05/30/2017   HGB 11.7 (L) 05/30/2017   HCT 36.4 (L) 05/30/2017   MCV 89.4 05/30/2017   PLT 115 (L) 05/30/2017    Recent Labs  Lab 05/30/17 2157  06/02/17 0920  NA 138   < > 135  K 4.9   < > 4.2  CL 101   < > 99*  CO2 27   < > 26  BUN 21*   < > 40*  CREATININE 1.38*   < > 1.57*  CALCIUM 8.9   < > 8.3*  PROT 6.7  --   --   BILITOT 0.6  --   --   ALKPHOS 76  --   --   ALT 20  --   --   AST 24  --   --   GLUCOSE 128*   < > 134*   < > = values in this interval not displayed.    Discharge Medications:  Allergies as of 06/02/2017      Reactions   Aleve [naproxen Sodium] Hives   Codeine Other (See Comments)   unknown   Penicillin G Benzathine    Pt doesn't remember reaction      Medication List    TAKE these medications   albuterol 108 (90 Base) MCG/ACT inhaler Commonly known as:  PROVENTIL HFA;VENTOLIN HFA Inhale 2 puffs into the lungs every 6 (six) hours as needed for wheezing or shortness of breath.   aspirin EC 81 MG tablet Take 1 tablet (81 mg total) by mouth daily.   atorvastatin 10 MG tablet Commonly known as:  LIPITOR Take 10 mg by mouth daily.   cefUROXime 250 MG tablet Commonly known as:  CEFTIN Take 1 tablet (250 mg total) by mouth daily for 5 days. Notes to patient:  Please stop this medicine and notify us if you develop any itching, symptoms/reaction.   cetirizine  10 MG tablet Commonly known as:  ZYRTEC Take 10 mg by mouth as needed for allergies.   levothyroxine 88 MCG tablet Commonly known as:  SYNTHROID, LEVOTHROID Take 88 mcg by mouth daily before breakfast.   mometasone-formoterol 100-5 MCG/ACT Aero Commonly known as:  DULERA Inhale 2 puffs into the lungs as needed.   Omega-3 300 MG Caps Take 900 mg by mouth daily.   saw  palmetto 500 MG capsule Take 500 mg by mouth daily.       Disposition:  Home  Discharge Instructions    Diet - low sodium heart healthy   Complete by:  As directed    Increase activity slowly   Complete by:  As directed      Follow-up Information    [place] Office Follow up on 06/15/2017.   Specialty:  Cardiology Why:  11:00AM, wound check visit Contact information: [address] [place]       Evans Lance, MD Follow up on 08/31/2017.   Specialty:  Cardiology Why:  12:00PM (noon) Contact information: [address] [place] [address] [phone]        Wenda Low, MD. Call.   Specialty:  Internal Medicine Why:  Please call and notify your Primary doctor of your hospital stay and arrange follow up within the next 2 weeks. Contact information: [address], [address] [place] [address] [phone]        [facility] Follow up.   Specialty:  [place] Why:  A home health care nurse will go to your home Contact information: [address] [place] [address] [phone]           Duration of Discharge Encounter: Greater than 30 minutes including physician time.  Venetia Night, PA-C 06/02/2017 2:06 PM  EP Attending  Patient seen and examined. Agree with above. He is stable for Dc home with usual followup.  Mikle Bosworth.D.

## 2017-06-02 NOTE — Care Management Note (Addendum)
Case Management Note  Patient Details  Name: Jay [name] MRN: [id number] Date of Birth: 1925-09-16  Subjective/Objective:      Second Degree AVB             Action/Plan: Patient lives at home with spouse; Primary Care Provider: Wenda Low, MD; has private insurance with Medicare; pharmacy [facility] choice [facility]; DME - has a scooter nd a cane; he stated that he does not using a cane or a walker ( furniture walking in the home: Patient is agreeable to [facility] for medication management; [place] choice offered, [facility] chose [facility]; [facility] with [facility] called for arrangement. CM will continue to follow for progression of care.  Expected Discharge Date:    possibly 06/02/2017              Expected Discharge Plan:  [place]  Discharge planning Services  CM Consult Choice offered to:  Patient  HH Arranged:  RN [place] at Home Status of Service:  In process, will continue to follow  Sherrilyn Rist [phone] 06/02/2017, 12:44 PM

## 2017-06-02 NOTE — Evaluation (Signed)
Physical Therapy Evaluation Patient Details Name: Jay [name] MRN: [id number] DOB: 1926/03/22 Today's Date: 06/02/2017   History of Present Illness  Pt adm with bradycardia with second degree AV block and underwent pacemaker placement on 06/01/17. PMH - AAA rupture, arthritis  Clinical Impression  Pt doing well with mobility and no further PT needed.  Ready for dc from PT standpoint.      Follow Up Recommendations No PT follow up    Equipment Recommendations  None recommended by PT    Recommendations for Other Services       Precautions / Restrictions Precautions Precautions: Fall Restrictions Weight Bearing Restrictions: No LUE Weight Bearing: Non weight bearing      Mobility  Bed Mobility Overal bed mobility: Modified Independent             General bed mobility comments: Incr time  Transfers Overall transfer level: Modified independent Equipment used: None;Rolling walker (2 wheeled)             General transfer comment: Quick descent to low bed  Ambulation/Gait Ambulation/Gait assistance: Min guard;Supervision Ambulation Distance (Feet): 500 Feet Assistive device: Rolling walker (2 wheeled);None Gait Pattern/deviations: Step-through pattern;Drifts right/left   Gait velocity interpretation: at or above normal speed for age/gender General Gait Details: Pt amb with slightly unsteady gait but no loss of balance. Stability improved with incr distance. Used rolling walker and then no device and pt did just as well without assistive device. Amb on [name] with SpO2 93%.   Stairs            Wheelchair Mobility    Modified Rankin (Stroke Patients Only)       Balance Overall balance assessment: Needs assistance Sitting-balance support: No upper extremity supported;Feet supported Sitting balance-Leahy Scale: Normal     Standing balance support: No upper extremity supported Standing balance-Leahy Scale: Good Standing balance comment: Slight  instability with dynamic but no loss of balance                             Pertinent Vitals/Pain Pain Assessment: Faces Faces Pain Scale: Hurts little more Pain Location: lt pacer site Pain Descriptors / Indicators: Operative site guarding;Sore Pain Intervention(s): Limited activity within patient's tolerance;Monitored during session    [place] expects to be discharged to:: Private residence Living Arrangements: Spouse/significant other Available Help at Discharge: Family Type of Home: House Home Access: Stairs to enter   Technical brewer of Steps: 1 Home Layout: One level Home Equipment: None Additional Comments: daughter lives nearby and available    Prior Function Level of Independence: Independent         Comments: Drives, cares for wife     Hand Dominance        Extremity/Trunk Assessment   Upper Extremity Assessment Upper Extremity Assessment: LUE deficits/detail LUE Deficits / Details: Limited by pacer precautions.    Lower Extremity Assessment Lower Extremity Assessment: Overall WFL for tasks assessed       Communication   Communication: HOH  Cognition Arousal/Alertness: Awake/alert Behavior During Therapy: WFL for tasks assessed/performed Overall Cognitive Status: Within Functional Limits for tasks assessed                                 General Comments: Had hallucinations yesterday (had them with prior surgery)      General Comments      Exercises  Assessment/Plan    PT Assessment Patent does not need any further PT services  PT Problem List         PT Treatment Interventions      PT Goals (Current goals can be found in the Care Plan section)  Acute Rehab PT Goals PT Goal Formulation: All assessment and education complete, DC therapy    Frequency     Barriers to discharge        Co-evaluation               AM-PAC PT "6 Clicks" Daily Activity  Outcome Measure  Difficulty turning over in bed (including adjusting bedclothes, sheets and blankets)?: None Difficulty moving from lying on back to sitting on the side of the bed? : A Little Difficulty sitting down on and standing up from a chair with arms (e.g., wheelchair, bedside commode, etc,.)?: A Little Help needed moving to and from a bed to chair (including a wheelchair)?: None Help needed walking in hospital room?: None Help needed climbing 3-5 steps with a railing? : A Little 6 Click Score: 21    End of Session Equipment Utilized During Treatment: Gait belt Activity Tolerance: Patient tolerated treatment well Patient left: in bed;with call bell/phone within reach;with nursing/sitter in room;with family/visitor present Nurse Communication: Mobility status PT Visit Diagnosis: Unsteadiness on feet (R26.81)    Time: 5916-3846 PT Time Calculation (min) (ACUTE ONLY): 20 min   Charges:   PT Evaluation $PT Eval Low Complexity: 1 Low     PT G CodesMarland Kitchen        [facility] [place] 06/02/2017, 11:25 AM

## 2017-06-02 NOTE — Progress Notes (Signed)
Discharged to home transported by daughter, d/c instructions and follow up appointments  discussed with patient and reiterated with the daughter, verbalized understanding. PIV removed  Site swollen and pink.

## 2017-06-02 NOTE — Progress Notes (Signed)
Progress Note  Patient Name: Jay [name] Date of Encounter: 06/02/2017  Primary Cardiologist: No primary care provider on file.   Subjective   No chest pain or sob.   Inpatient Medications    Scheduled Meds: . aspirin EC  81 mg Oral Daily  . atorvastatin  10 mg Oral q1800  . levothyroxine  88 mcg Oral QAC breakfast  . mouth rinse  15 mL Mouth Rinse BID   Continuous Infusions: . DOPamine Stopped (05/31/17 1830)   PRN Meds: acetaminophen, ondansetron (ZOFRAN) IV   Vital Signs    Vitals:   06/01/17 1800 06/01/17 1900 06/01/17 2044 06/02/17 0625  BP: (!) 110/59 (!) 93/48 (!) 99/51 (!) 100/48  Pulse: 66 74 71 86  Resp: 20 19 18    Temp:   98 F (36.7 C) 97.8 F (36.6 C)  TempSrc:   Oral Oral  SpO2: 100% 100% 100% 95%  Weight:   156 lb 1.6 oz (70.8 kg) 156 lb (70.8 kg)  Height:   5\' 10"  (1.778 m)     Intake/Output Summary (Last 24 hours) at 06/02/2017 0816 Last data filed at 06/02/2017 4627 Gross per 24 hour  Intake 240 ml  Output 1400 ml  Net -1160 ml   Filed Weights   05/30/17 2140 06/01/17 2044 06/02/17 0625  Weight: 163 lb 5.8 oz (74.1 kg) 156 lb 1.6 oz (70.8 kg) 156 lb (70.8 kg)    Telemetry    nsr with ventricular pacing and PVC's - Personally Reviewed  ECG    none - Personally Reviewed  Physical Exam   GEN: elderly appearing man, No acute distress.   Neck: 6 cm JVD Cardiac: RRR, no murmurs, rubs, or gallops.  Respiratory: Clear to auscultation bilaterally. Minimal ecchymosis over PPM site. GI: Soft, nontender, non-distended  MS: No edema; No deformity. Neuro:  Nonfocal  Psych: Normal affect   Labs    Chemistry Recent Labs  Lab 05/30/17 2157 05/31/17 0636 06/01/17 0648  NA 138 136 135  K 4.9 3.6 4.8  CL 101 103 103  CO2 27 22 24   GLUCOSE 128* 95 97  BUN 21* 24* 33*  CREATININE 1.38* 1.55* 1.58*  CALCIUM 8.9 8.5* 8.1*  PROT 6.7  --   --   ALBUMIN 3.3*  --   --   AST 24  --   --   ALT 20  --   --   ALKPHOS 76  --   --     BILITOT 0.6  --   --   GFRNONAA 44* 38* 37*  GFRAA 51* 44* 43*  ANIONGAP 10 11 8      Hematology Recent Labs  Lab 05/30/17 1623 05/30/17 2157  WBC 6.4 7.4  RBC 4.33 4.07*  HGB 12.5* 11.7*  HCT 38.5* 36.4*  MCV 88.9 89.4  MCH 28.9 28.7  MCHC 32.5 32.1  RDW 14.3 14.3  PLT 126* 115*    Cardiac EnzymesNo results for input(s): TROPONINI in the last 168 hours.  Recent Labs  Lab 05/30/17 1659  TROPIPOC 0.00     BNPNo results for input(s): BNP, PROBNP in the last 168 hours.   DDimer No results for input(s): DDIMER in the last 168 hours.   Radiology    Dg Chest Port 1 View  Result Date: 05/31/2017 CLINICAL DATA:  84 year old male post pacemaker placement. Subsequent encounter. EXAM: PORTABLE CHEST 1 VIEW COMPARISON:  05/30/2017 and 03/30/2016 chest x-ray. FINDINGS: Sequential pacemaker has been placed from left. Leads project at the level  of the right atrium and right ventricle. No pneumothorax noted. Suspect pulmonary edema superimposed upon chronic lung changes. Infiltrate less likely consideration. Calcified aorta. IMPRESSION: Sequential pacemaker has been placed from left. Leads project at the level of the right atrium and right ventricle. No pneumothorax noted. Suspect pulmonary edema superimposed upon chronic lung changes. Aortic Atherosclerosis (ICD10-I70.0). Electronically Signed   By: Genia Del M.D.   On: 05/31/2017 19:23    Cardiac Studies   none  Patient Profile     83 y.o. male admitted with CHB, s/p PPM  Assessment & Plan    1. CHB - he is doing well after PPM insertion. 2. Visual hallucinations - these have improved. His daughter notes that he had these when he has his heart surgery 8 years ago and they went away about a week after her got home.  3. Mobility - he got up yesterday but I will ask PT service to evaluate. His wife is adamant that he not go to a SNF. I would like to be sure that we know he is not a big fall risk.  Gregg Taylor,M.D.  For  questions or updates, please contact [place] Please consult [email or web address] for contact info under Cardiology/STEMI.      Signed, Cristopher Peru, MD  06/02/2017, 8:16 AM  Patient ID: Jay [name], male   DOB: [birth year], 84 y.o.   MRN: [id number]

## 2017-06-04 DIAGNOSIS — H9193 Unspecified hearing loss, bilateral: Secondary | ICD-10-CM | POA: Diagnosis not present

## 2017-06-04 DIAGNOSIS — I4510 Unspecified right bundle-branch block: Secondary | ICD-10-CM | POA: Diagnosis not present

## 2017-06-04 DIAGNOSIS — I10 Essential (primary) hypertension: Secondary | ICD-10-CM | POA: Diagnosis not present

## 2017-06-04 DIAGNOSIS — Z48812 Encounter for surgical aftercare following surgery on the circulatory system: Secondary | ICD-10-CM | POA: Diagnosis not present

## 2017-06-04 DIAGNOSIS — K5790 Diverticulosis of intestine, part unspecified, without perforation or abscess without bleeding: Secondary | ICD-10-CM | POA: Diagnosis not present

## 2017-06-04 DIAGNOSIS — I700 Atherosclerosis of aorta: Secondary | ICD-10-CM | POA: Diagnosis not present

## 2017-06-04 DIAGNOSIS — I2510 Atherosclerotic heart disease of native coronary artery without angina pectoris: Secondary | ICD-10-CM | POA: Diagnosis not present

## 2017-06-04 DIAGNOSIS — Z7982 Long term (current) use of aspirin: Secondary | ICD-10-CM | POA: Diagnosis not present

## 2017-06-04 DIAGNOSIS — E039 Hypothyroidism, unspecified: Secondary | ICD-10-CM | POA: Diagnosis not present

## 2017-06-04 DIAGNOSIS — Z950 Presence of cardiac pacemaker: Secondary | ICD-10-CM | POA: Diagnosis not present

## 2017-06-04 DIAGNOSIS — I2583 Coronary atherosclerosis due to lipid rich plaque: Secondary | ICD-10-CM | POA: Diagnosis not present

## 2017-06-04 DIAGNOSIS — M1991 Primary osteoarthritis, unspecified site: Secondary | ICD-10-CM | POA: Diagnosis not present

## 2017-06-04 DIAGNOSIS — E785 Hyperlipidemia, unspecified: Secondary | ICD-10-CM | POA: Diagnosis not present

## 2017-06-08 DIAGNOSIS — Z950 Presence of cardiac pacemaker: Secondary | ICD-10-CM | POA: Diagnosis not present

## 2017-06-08 DIAGNOSIS — K5900 Constipation, unspecified: Secondary | ICD-10-CM | POA: Diagnosis not present

## 2017-06-08 DIAGNOSIS — I442 Atrioventricular block, complete: Secondary | ICD-10-CM | POA: Diagnosis not present

## 2017-06-15 ENCOUNTER — Ambulatory Visit (INDEPENDENT_AMBULATORY_CARE_PROVIDER_SITE_OTHER): Payer: Medicare Other | Admitting: *Deleted

## 2017-06-15 DIAGNOSIS — I442 Atrioventricular block, complete: Secondary | ICD-10-CM

## 2017-06-15 DIAGNOSIS — Z950 Presence of cardiac pacemaker: Secondary | ICD-10-CM

## 2017-06-15 LAB — CUP PACEART INCLINIC DEVICE CHECK
Battery Remaining Longevity: 73 mo
Battery Voltage: 3.04 V
Brady Statistic RA Percent Paced: 38 %
Brady Statistic RV Percent Paced: 94 %
Date Time Interrogation Session: 20190319114902
Implantable Lead Implant Date: 20190304
Implantable Lead Implant Date: 20190304
Implantable Lead Location: 753859
Implantable Lead Location: 753860
Implantable Pulse Generator Implant Date: 20190304
Lead Channel Impedance Value: 450 Ohm
Lead Channel Impedance Value: 662.5 Ohm
Lead Channel Pacing Threshold Amplitude: 0.75 V
Lead Channel Pacing Threshold Amplitude: 0.75 V
Lead Channel Pacing Threshold Pulse Width: 0.5 ms
Lead Channel Pacing Threshold Pulse Width: 0.5 ms
Lead Channel Sensing Intrinsic Amplitude: 12 mV
Lead Channel Sensing Intrinsic Amplitude: 5 mV
Lead Channel Setting Pacing Amplitude: 3.5 V
Lead Channel Setting Pacing Amplitude: 3.5 V
Lead Channel Setting Pacing Pulse Width: 0.5 ms
Lead Channel Setting Sensing Sensitivity: 4 mV
Pulse Gen Model: 2272
Pulse Gen Serial Number: 8992729

## 2017-06-15 NOTE — Progress Notes (Signed)
Wound check appointment. Steri-strips removed. Wound without redness or edema. Incision edges approximated, wound well healed. Normal device function. Thresholds, sensing, and impedances consistent with implant measurements. Device programmed at 3.5V for extra safety margin until 3 month visit. Histogram distribution appropriate for patient and level of activity. 1 mode switch episode (<1%)--AT, duration 6sec. No high ventricular rates noted. Patient educated about wound care, arm mobility, lifting restrictions, and Merlin monitor. ROV with GT on 08/31/17.

## 2017-08-31 ENCOUNTER — Ambulatory Visit (INDEPENDENT_AMBULATORY_CARE_PROVIDER_SITE_OTHER): Payer: Medicare Other | Admitting: Internal Medicine

## 2017-08-31 ENCOUNTER — Encounter: Payer: Self-pay | Admitting: Internal Medicine

## 2017-08-31 VITALS — BP 132/80 | HR 62 | Ht 70.0 in | Wt 158.0 lb

## 2017-08-31 DIAGNOSIS — Z952 Presence of prosthetic heart valve: Secondary | ICD-10-CM | POA: Diagnosis not present

## 2017-08-31 DIAGNOSIS — I442 Atrioventricular block, complete: Secondary | ICD-10-CM

## 2017-08-31 DIAGNOSIS — Z950 Presence of cardiac pacemaker: Secondary | ICD-10-CM | POA: Diagnosis not present

## 2017-08-31 DIAGNOSIS — I2510 Atherosclerotic heart disease of native coronary artery without angina pectoris: Secondary | ICD-10-CM

## 2017-08-31 NOTE — Progress Notes (Signed)
HPI [name] returns today for PPM followup. He is a pleasant elderly man who developed CHB, and underwent emergent PPM approx 3 months ago. He has done well in the interim. No chest pain or syncope. He notes mild dyspnea with exertion. He wakes up early in the morning, and sits up and then feels better. He admits to dietary indiscretion with sodium.  Allergies  Allergen Reactions  . Aleve [Naproxen Sodium] Hives  . Codeine Other (See Comments)    unknown  . Penicillin G Benzathine     Pt doesn't remember reaction     Current Outpatient Medications  Medication Sig Dispense Refill  . albuterol (PROVENTIL HFA;VENTOLIN HFA) 108 (90 BASE) MCG/ACT inhaler Inhale 2 puffs into the lungs every 6 (six) hours as needed for wheezing or shortness of breath.    [name] aspirin EC 81 MG tablet Take 1 tablet (81 mg total) by mouth daily. 90 tablet 3  . atorvastatin (LIPITOR) 10 MG tablet Take 10 mg by mouth daily.    . cetirizine (ZYRTEC) 10 MG tablet Take 10 mg by mouth as needed for allergies.     [name] levothyroxine (SYNTHROID, LEVOTHROID) 88 MCG tablet Take 88 mcg by mouth daily before breakfast.    . mometasone-formoterol (DULERA) 100-5 MCG/ACT AERO Inhale 2 puffs into the lungs as needed.     . Omega-3 300 MG CAPS Take 900 mg by mouth daily.    . saw palmetto 500 MG capsule Take 500 mg by mouth daily.     No current facility-administered medications for this visit.      Past Medical History:  Diagnosis Date  . Allergic rhinitis    ,RAD wheezing  . Cataracts, bilateral   . Diverticulosis    , ACBE, flex, in 2003  . Dyslipidemia   . Fracture of left clavicle    , Hx  . Hearing loss    , Bilateral  . Hypothyroidism   . Insomnia   . Kidney stone   . OA (optic atrophy)   . Osteoarthritis   . Pelvic fracture (HCC)    , Hx  . RBBB (right bundle branch block)    , seen on EKG  . Varicose veins     ROS:   All systems reviewed and negative except as noted in the HPI.   Past  Surgical History:  Procedure Laterality Date  . ABDOMINAL AORTOGRAM N/A 06/22/2016   Procedure: Abdominal Aortogram;  Surgeon: Katha Cabal, MD;  Location: [facility];  Service: Cardiovascular;  Laterality: N/A;  . AORTIC INTERVENTION N/A 06/22/2016   Procedure: Aortic Intervention;  Surgeon: Katha Cabal, MD;  Location: [facility];  Service: Cardiovascular;  Laterality: N/A;  . AORTIC VALVE REPLACEMENT  2011  . CATARACT EXTRACTION W/PHACO Right 02/17/2016   Procedure: CATARACT EXTRACTION PHACO AND INTRAOCULAR LENS PLACEMENT (IOC);  Surgeon: Ronnell Freshwater, MD;  Location: [facility];  Service: Ophthalmology;  Laterality: Right;  RIGHT  . INGUINAL HERNIA REPAIR Bilateral   . Orchectomy Right   . PACEMAKER IMPLANT N/A 05/31/2017   Procedure: PACEMAKER IMPLANT;  Surgeon: Evans Lance, MD;  Location: [facility];  Service: Cardiovascular;  Laterality: N/A;  . TEMPORARY PACEMAKER N/A 05/31/2017   Procedure: TEMPORARY PACEMAKER;  Surgeon: Sherren Mocha, MD;  Location: [facility];  Service: Cardiovascular;  Laterality: N/A;  . THYROIDECTOMY       Family History  Problem Relation Age of Onset  .  Diabetes Mellitus II Mother      Social History   Socioeconomic History  . Marital status: Married    Spouse name: Not on file  . Number of children: Not on file  . Years of education: Not on file  . Highest education level: Not on file  Occupational History  . Not on file  Social Needs  . Financial resource strain: Not on file  . Food insecurity:    Worry: Not on file    Inability: Not on file  . Transportation needs:    Medical: Not on file    Non-medical: Not on file  Tobacco Use  . Smoking status: Former Smoker    Last attempt to quit: 03/30/1965    Years since quitting: 52.4  . Smokeless tobacco: Current User    Types: Chew  Substance and Sexual Activity  . Alcohol use: No    Comment: Rarely  . Drug use: No  .  Sexual activity: Not on file  Lifestyle  . Physical activity:    Days per week: Not on file    Minutes per session: Not on file  . Stress: Not on file  Relationships  . Social connections:    Talks on phone: Not on file    Gets together: Not on file    Attends religious service: Not on file    Active member of club or organization: Not on file    Attends meetings of clubs or organizations: Not on file    Relationship status: Not on file  . Intimate partner violence:    Fear of current or ex partner: Not on file    Emotionally abused: Not on file    Physically abused: Not on file    Forced sexual activity: Not on file  Other Topics Concern  . Not on file  Social History Narrative  . Not on file     BP 132/80   Pulse 62   Ht 5\' 10"  (1.778 m)   Wt 158 lb (71.7 kg)   BMI 22.67 kg/m   Physical Exam:  Well appearing NAD HEENT: Unremarkable Neck:  No JVD, no thyromegally Lymphatics:  No adenopathy Back:  No CVA tenderness Lungs:  Clear with no wheezes HEART:  Regular rate rhythm, no murmurs, no rubs, no clicks Abd:  soft, positive bowel sounds, no organomegally, no rebound, no guarding Ext:  2 plus pulses, no edema, no cyanosis, no clubbing Skin:  No rashes no nodules Neuro:  CN II through XII intact, motor grossly intact  EKG - AV paced  DEVICE  Normal device function.  See PaceArt for details.   Assess/Plan: 1. CHB - he is asymptomatic, s/p PPM 2. PPM - his St. Jude DDD PM is working normally. Will recheck in several months. 3. HTN - his blood pressure is well controlled. Will follow. 4. Diastolic heart failure - he notes some early morning PND and orthopnea. I discussed starting a low dose of diuretic but he admits to eating too much salty food and I asked him to first try with a low sodium diet.  Mikle Bosworth.D.

## 2017-08-31 NOTE — Patient Instructions (Addendum)
Medication Instructions:  Your physician recommends that you continue on your current medications as directed. Please refer to the Current Medication list given to you today.  Labwork: None ordered.  Testing/Procedures: None ordered.  Follow-Up: Your physician wants you to follow-up in: one year with Dr. Lovena Le.   You will receive a reminder letter in the mail two months in advance. If you don't receive a letter, please call our office to schedule the follow-up appointment.  Remote monitoring is used to monitor your Pacemaker from home. This monitoring reduces the number of office visits required to check your device to one time per year. It allows [place] to keep an eye on the functioning of your device to ensure it is working properly. You are scheduled for a device check from home on 11/30/2017. You may send your transmission at any time that day. If you have a wireless device, the transmission will be sent automatically. After your physician reviews your transmission, you will receive a postcard with your next transmission date.  Any Other Special Instructions Will Be Listed Below (If Applicable).  Decrease your salt intake  If you need a refill on your cardiac medications before your next appointment, please call your pharmacy.    DASH Eating Plan DASH stands for "Dietary Approaches to Stop Hypertension." The DASH eating plan is a healthy eating plan that has been shown to reduce high blood pressure (hypertension). It may also reduce your risk for type 2 diabetes, heart disease, and stroke. The DASH eating plan may also help with weight loss. What are tips for following this plan? General guidelines  Avoid eating more than 2,300 mg (milligrams) of salt (sodium) a day. If you have hypertension, you may need to reduce your sodium intake to 1,500 mg a day.  Limit alcohol intake to no more than 1 drink a day for nonpregnant women and 2 drinks a day for men. One drink equals 12 oz of beer, 5 oz  of wine, or 1 oz of hard liquor.  Work with your health care provider to maintain a healthy body weight or to lose weight. Ask what an ideal weight is for you.  Get at least 30 minutes of exercise that causes your heart to beat faster (aerobic exercise) most days of the week. Activities may include walking, swimming, or biking.  Work with your health care provider or diet and nutrition specialist (dietitian) to adjust your eating plan to your individual calorie needs. Reading food labels  Check food labels for the amount of sodium per serving. Choose foods with less than 5 percent of the Daily Value of sodium. Generally, foods with less than 300 mg of sodium per serving fit into this eating plan.  To find whole grains, look for the word "whole" as the first word in the ingredient list. Shopping  Buy products labeled as "low-sodium" or "no salt added."  Buy fresh foods. Avoid canned foods and premade or frozen meals. Cooking  Avoid adding salt when cooking. Use salt-free seasonings or herbs instead of table salt or sea salt. Check with your health care provider or pharmacist before using salt substitutes.  Do not fry foods. Cook foods using healthy methods such as baking, boiling, grilling, and broiling instead.  Cook with heart-healthy oils, such as olive, canola, soybean, or sunflower oil. Meal planning   Eat a balanced diet that includes: ? 5 or more servings of fruits and vegetables each day. At each meal, try to fill half of your plate with  fruits and vegetables. ? Up to 6-8 servings of whole grains each day. ? Less than 6 oz of lean meat, poultry, or fish each day. A 3-oz serving of meat is about the same size as a deck of cards. One egg equals 1 oz. ? 2 servings of low-fat dairy each day. ? A serving of nuts, seeds, or beans 5 times each week. ? Heart-healthy fats. Healthy fats called Omega-3 fatty acids are found in foods such as flaxseeds and coldwater fish, like sardines,  salmon, and mackerel.  Limit how much you eat of the following: ? Canned or prepackaged foods. ? Food that is high in trans fat, such as fried foods. ? Food that is high in saturated fat, such as fatty meat. ? Sweets, desserts, sugary drinks, and other foods with added sugar. ? Full-fat dairy products.  Do not salt foods before eating.  Try to eat at least 2 vegetarian meals each week.  Eat more home-cooked food and less restaurant, buffet, and fast food.  When eating at a restaurant, ask that your food be prepared with less salt or no salt, if possible. What foods are recommended? The items listed may not be a complete list. Talk with your dietitian about what dietary choices are best for you. Grains Whole-grain or whole-wheat bread. Whole-grain or whole-wheat pasta. Brown rice. Modena Morrow. Bulgur. Whole-grain and low-sodium cereals. Pita bread. Low-fat, low-sodium crackers. Whole-wheat flour tortillas. Vegetables Fresh or frozen vegetables (raw, steamed, roasted, or grilled). Low-sodium or reduced-sodium tomato and vegetable juice. Low-sodium or reduced-sodium tomato sauce and tomato paste. Low-sodium or reduced-sodium canned vegetables. Fruits All fresh, dried, or frozen fruit. Canned fruit in natural juice (without added sugar). Meat and other protein foods Skinless chicken or [place]. Ground chicken or [place]. Pork with fat trimmed off. Fish and seafood. Egg whites. Dried beans, peas, or lentils. Unsalted nuts, nut butters, and seeds. Unsalted canned beans. Lean cuts of beef with fat trimmed off. Low-sodium, lean deli meat. Dairy Low-fat (1%) or fat-free (skim) milk. Fat-free, low-fat, or reduced-fat cheeses. Nonfat, low-sodium ricotta or cottage cheese. Low-fat or nonfat yogurt. Low-fat, low-sodium cheese. Fats and oils Soft margarine without trans fats. Vegetable oil. Low-fat, reduced-fat, or light mayonnaise and salad dressings (reduced-sodium). Canola, safflower, olive,  soybean, and sunflower oils. Avocado. Seasoning and other foods Herbs. Spices. Seasoning mixes without salt. Unsalted popcorn and pretzels. Fat-free sweets. What foods are not recommended? The items listed may not be a complete list. Talk with your dietitian about what dietary choices are best for you. Grains Baked goods made with fat, such as croissants, muffins, or some breads. Dry pasta or rice meal packs. Vegetables Creamed or fried vegetables. Vegetables in a cheese sauce. Regular canned vegetables (not low-sodium or reduced-sodium). Regular canned tomato sauce and paste (not low-sodium or reduced-sodium). Regular tomato and vegetable juice (not low-sodium or reduced-sodium). Angie Fava. Olives. Fruits Canned fruit in a light or heavy syrup. Fried fruit. Fruit in cream or butter sauce. Meat and other protein foods Fatty cuts of meat. Ribs. Fried meat. Berniece Salines. Sausage. Bologna and other processed lunch meats. Salami. Fatback. Hotdogs. Bratwurst. Salted nuts and seeds. Canned beans with added salt. Canned or smoked fish. Whole eggs or egg yolks. Chicken or [place] with skin. Dairy Whole or 2% milk, cream, and half-and-half. Whole or full-fat cream cheese. Whole-fat or sweetened yogurt. Full-fat cheese. Nondairy creamers. Whipped toppings. Processed cheese and cheese spreads. Fats and oils Butter. Stick margarine. Lard. Shortening. Ghee. Bacon fat. Tropical oils, such as coconut, palm  kernel, or palm oil. Seasoning and other foods Salted popcorn and pretzels. Onion salt, garlic salt, seasoned salt, table salt, and sea salt. Worcestershire sauce. Tartar sauce. Barbecue sauce. Teriyaki sauce. Soy sauce, including reduced-sodium. Steak sauce. Canned and packaged gravies. Fish sauce. Oyster sauce. Cocktail sauce. Horseradish that you find on the shelf. Ketchup. Mustard. Meat flavorings and tenderizers. Bouillon cubes. Hot sauce and Tabasco sauce. Premade or packaged marinades. Premade or packaged taco  seasonings. Relishes. Regular salad dressings. Where to find more information:  National Heart, Lung, and [place]: https://wilson-eaton.com/  American Heart Association: www.heart.org Summary  The DASH eating plan is a healthy eating plan that has been shown to reduce high blood pressure (hypertension). It may also reduce your risk for type 2 diabetes, heart disease, and stroke.  With the DASH eating plan, you should limit salt (sodium) intake to 2,300 mg a day. If you have hypertension, you may need to reduce your sodium intake to 1,500 mg a day.  When on the DASH eating plan, aim to eat more fresh fruits and vegetables, whole grains, lean proteins, low-fat dairy, and heart-healthy fats.  Work with your health care provider or diet and nutrition specialist (dietitian) to adjust your eating plan to your individual calorie needs. This information is not intended to replace advice given to you by your health care provider. Make sure you discuss any questions you have with your health care provider. Document Released: 03/05/2011 Document Revised: 03/09/2016 Document Reviewed: 03/09/2016 Elsevier Interactive Patient Education  [employer].

## 2017-09-01 LAB — CUP PACEART INCLINIC DEVICE CHECK
Battery Remaining Longevity: 108 mo
Battery Voltage: 3.01 V
Brady Statistic RA Percent Paced: 47 %
Brady Statistic RV Percent Paced: 97 %
Date Time Interrogation Session: 20190604160538
Implantable Lead Implant Date: 20190304
Implantable Lead Implant Date: 20190304
Implantable Lead Location: 753859
Implantable Lead Location: 753860
Implantable Pulse Generator Implant Date: 20190304
Lead Channel Impedance Value: 412.5 Ohm
Lead Channel Impedance Value: 662.5 Ohm
Lead Channel Pacing Threshold Amplitude: 0.5 V
Lead Channel Pacing Threshold Amplitude: 0.5 V
Lead Channel Pacing Threshold Amplitude: 0.75 V
Lead Channel Pacing Threshold Amplitude: 0.75 V
Lead Channel Pacing Threshold Pulse Width: 0.5 ms
Lead Channel Pacing Threshold Pulse Width: 0.5 ms
Lead Channel Pacing Threshold Pulse Width: 0.5 ms
Lead Channel Pacing Threshold Pulse Width: 0.5 ms
Lead Channel Sensing Intrinsic Amplitude: 12 mV
Lead Channel Sensing Intrinsic Amplitude: 3.9 mV
Lead Channel Setting Pacing Amplitude: 2 V
Lead Channel Setting Pacing Amplitude: 2.5 V
Lead Channel Setting Pacing Pulse Width: 0.5 ms
Lead Channel Setting Sensing Sensitivity: 4 mV
Pulse Gen Model: 2272
Pulse Gen Serial Number: 8992729

## 2017-09-09 DIAGNOSIS — Z952 Presence of prosthetic heart valve: Secondary | ICD-10-CM | POA: Diagnosis not present

## 2017-09-09 DIAGNOSIS — Z950 Presence of cardiac pacemaker: Secondary | ICD-10-CM | POA: Diagnosis not present

## 2017-09-09 DIAGNOSIS — Z8679 Personal history of other diseases of the circulatory system: Secondary | ICD-10-CM | POA: Diagnosis not present

## 2017-09-09 DIAGNOSIS — N183 Chronic kidney disease, stage 3 (moderate): Secondary | ICD-10-CM | POA: Diagnosis not present

## 2017-09-09 DIAGNOSIS — H9193 Unspecified hearing loss, bilateral: Secondary | ICD-10-CM | POA: Diagnosis not present

## 2017-09-09 DIAGNOSIS — I700 Atherosclerosis of aorta: Secondary | ICD-10-CM | POA: Diagnosis not present

## 2017-09-09 DIAGNOSIS — E039 Hypothyroidism, unspecified: Secondary | ICD-10-CM | POA: Diagnosis not present

## 2017-09-09 DIAGNOSIS — E7800 Pure hypercholesterolemia, unspecified: Secondary | ICD-10-CM | POA: Diagnosis not present

## 2017-09-09 DIAGNOSIS — R7303 Prediabetes: Secondary | ICD-10-CM | POA: Diagnosis not present

## 2017-09-09 DIAGNOSIS — Z9889 Other specified postprocedural states: Secondary | ICD-10-CM | POA: Diagnosis not present

## 2017-11-30 ENCOUNTER — Ambulatory Visit (INDEPENDENT_AMBULATORY_CARE_PROVIDER_SITE_OTHER): Payer: Medicare Other | Admitting: *Deleted

## 2017-11-30 DIAGNOSIS — I495 Sick sinus syndrome: Secondary | ICD-10-CM

## 2017-11-30 DIAGNOSIS — I10 Essential (primary) hypertension: Secondary | ICD-10-CM

## 2017-11-30 NOTE — Progress Notes (Signed)
Remote pacemaker transmission.   

## 2017-12-16 ENCOUNTER — Encounter: Payer: Self-pay | Admitting: Cardiology

## 2017-12-27 LAB — CUP PACEART REMOTE DEVICE CHECK
Battery Remaining Longevity: 104 mo
Battery Remaining Percentage: 95.5 %
Battery Voltage: 3.01 V
Brady Statistic AP VP Percent: 61 %
Brady Statistic AP VS Percent: 1 %
Brady Statistic AS VP Percent: 37 %
Brady Statistic AS VS Percent: 1 %
Brady Statistic RA Percent Paced: 58 %
Brady Statistic RV Percent Paced: 98 %
Date Time Interrogation Session: 20190903063409
Implantable Lead Implant Date: 20190304
Implantable Lead Implant Date: 20190304
Implantable Lead Location: 753859
Implantable Lead Location: 753860
Implantable Pulse Generator Implant Date: 20190304
Lead Channel Impedance Value: 400 Ohm
Lead Channel Impedance Value: 690 Ohm
Lead Channel Pacing Threshold Amplitude: 0.5 V
Lead Channel Pacing Threshold Amplitude: 0.75 V
Lead Channel Pacing Threshold Pulse Width: 0.5 ms
Lead Channel Pacing Threshold Pulse Width: 0.5 ms
Lead Channel Sensing Intrinsic Amplitude: 12 mV
Lead Channel Sensing Intrinsic Amplitude: 3.5 mV
Lead Channel Setting Pacing Amplitude: 2 V
Lead Channel Setting Pacing Amplitude: 2.5 V
Lead Channel Setting Pacing Pulse Width: 0.5 ms
Lead Channel Setting Sensing Sensitivity: 4 mV
Pulse Gen Model: 2272
Pulse Gen Serial Number: 8992729

## 2017-12-30 ENCOUNTER — Encounter: Payer: Self-pay | Admitting: Cardiology

## 2017-12-30 ENCOUNTER — Ambulatory Visit (INDEPENDENT_AMBULATORY_CARE_PROVIDER_SITE_OTHER): Payer: Medicare Other | Admitting: Cardiology

## 2017-12-30 VITALS — BP 138/74 | HR 52 | Ht 70.0 in | Wt 158.0 lb

## 2017-12-30 DIAGNOSIS — Z950 Presence of cardiac pacemaker: Secondary | ICD-10-CM

## 2017-12-30 DIAGNOSIS — Z952 Presence of prosthetic heart valve: Secondary | ICD-10-CM

## 2017-12-30 DIAGNOSIS — I442 Atrioventricular block, complete: Secondary | ICD-10-CM | POA: Diagnosis not present

## 2017-12-30 DIAGNOSIS — D692 Other nonthrombocytopenic purpura: Secondary | ICD-10-CM | POA: Diagnosis not present

## 2017-12-30 DIAGNOSIS — I2510 Atherosclerotic heart disease of native coronary artery without angina pectoris: Secondary | ICD-10-CM | POA: Diagnosis not present

## 2017-12-30 DIAGNOSIS — I495 Sick sinus syndrome: Secondary | ICD-10-CM | POA: Diagnosis not present

## 2017-12-30 NOTE — Patient Instructions (Signed)

## 2017-12-30 NOTE — Progress Notes (Signed)
Cardiology Office Note:    Date:  12/30/2017   ID:  [name], DOB 26-Sep-1925, MRN [id number]  PCP:  Wenda Low, MD  Cardiologist:  No primary care provider on file.  Electrophysiologist:  None   Referring MD: Wenda Low, MD     History of Present Illness:    Jay [name] is a 84 y.o. male with complete heart block underwent emergency pacemaker placement in March 2019 Dr. Lovena Le, doing quite well.  Still having chronic dyspnea on exertion.  No changes for him.  Pacemaker is [employer].  He has an aortic valve replacement #23 pericardial Edwards valve placed on 10/31/2009.  Prior chronic right bundle branch block.  Abdominal aortic aneurysm was treated at [facility].  Has 30% LAD disease, left circumflex with mild aneurysmal dilatation but no stenosis.  Right coronary was small and nondominant.  His wife had an myocardial infarction in the summer 2013 and Dr. Burt Knack took care of her.  His children live behind him on his property.   No further melena.  Past Medical History:  Diagnosis Date  . Allergic rhinitis    ,RAD wheezing  . Cataracts, bilateral   . Diverticulosis    , ACBE, flex, in 2003  . Dyslipidemia   . Fracture of left clavicle    , Hx  . Hearing loss    , Bilateral  . Hypothyroidism   . Insomnia   . Kidney stone   . OA (optic atrophy)   . Osteoarthritis   . Pelvic fracture (HCC)    , Hx  . RBBB (right bundle branch block)    , seen on EKG  . Varicose veins     Past Surgical History:  Procedure Laterality Date  . ABDOMINAL AORTOGRAM N/A 06/22/2016   Procedure: Abdominal Aortogram;  Surgeon: Katha Cabal, MD;  Location: [place] [facility];  Service: Cardiovascular;  Laterality: N/A;  . AORTIC INTERVENTION N/A 06/22/2016   Procedure: Aortic Intervention;  Surgeon: Katha Cabal, MD;  Location: [place] [facility];  Service: Cardiovascular;  Laterality: N/A;  . AORTIC VALVE REPLACEMENT  2011  . CATARACT EXTRACTION W/PHACO Right  02/17/2016   Procedure: CATARACT EXTRACTION PHACO AND INTRAOCULAR LENS PLACEMENT (IOC);  Surgeon: Ronnell Freshwater, MD;  Location: [place];  Service: Ophthalmology;  Laterality: Right;  RIGHT  . INGUINAL HERNIA REPAIR Bilateral   . Orchectomy Right   . PACEMAKER IMPLANT N/A 05/31/2017   Procedure: PACEMAKER IMPLANT;  Surgeon: Evans Lance, MD;  Location:  [place] [facility];  Service: Cardiovascular;  Laterality: N/A;  . TEMPORARY PACEMAKER N/A 05/31/2017   Procedure: TEMPORARY PACEMAKER;  Surgeon: Sherren Mocha, MD;  Location: [place] [facility];  Service: Cardiovascular;  Laterality: N/A;  . THYROIDECTOMY      Current Medications: Current Meds  Medication Sig  . albuterol (PROVENTIL HFA;VENTOLIN HFA) 108 (90 BASE) MCG/ACT inhaler Inhale 2 puffs into the lungs every 6 (six) hours as needed for wheezing or shortness of breath.  [name] aspirin EC 81 MG tablet Take 1 tablet (81 mg total) by mouth daily.  [name] atorvastatin (LIPITOR) 10 MG tablet Take 10 mg by mouth daily.  . cetirizine (ZYRTEC) 10 MG tablet Take 10 mg by mouth as needed for allergies.   [name] levothyroxine (SYNTHROID, LEVOTHROID) 88 MCG tablet Take 88 mcg by mouth daily before breakfast.  . mometasone-formoterol (DULERA) 100-5 MCG/ACT AERO Inhale 2 puffs into the lungs as needed.   . saw palmetto 500 MG capsule Take  500 mg by mouth daily.     Allergies:   Aleve [naproxen sodium]; Codeine; and Penicillin g benzathine   Social History   Socioeconomic History  . Marital status: Married    Spouse name: Not on file  . Number of children: Not on file  . Years of education: Not on file  . Highest education level: Not on file  Occupational History  . Not on file  Social Needs  . Financial resource strain: Not on file  . Food insecurity:    Worry: Not on file    Inability: Not on file  . Transportation needs:    Medical: Not on file    Non-medical: Not on file  Tobacco Use  . Smoking status: Former Smoker     Last attempt to quit: 03/30/1965    Years since quitting: 52.7  . Smokeless tobacco: Current User    Types: Chew  Substance and Sexual Activity  . Alcohol use: No    Comment: Rarely  . Drug use: No  . Sexual activity: Not on file  Lifestyle  . Physical activity:    Days per week: Not on file    Minutes per session: Not on file  . Stress: Not on file  Relationships  . Social connections:    Talks on phone: Not on file    Gets together: Not on file    Attends religious service: Not on file    Active member of club or organization: Not on file    Attends meetings of clubs or organizations: Not on file    Relationship status: Not on file  Other Topics Concern  . Not on file  Social History Narrative  . Not on file     Family History: The patient's family history includes Diabetes Mellitus II in his mother.  ROS:   Please see the history of present illness.    Hard of hearing.  Sometimes fatigued in the mid day.  All other systems reviewed and are negative.  EKGs/Labs/Other Studies Reviewed:    The following studies were reviewed today: Prior discharge summary hospital notes lab work office notes reviewed  EKG:  None today  Recent Labs: 05/30/2017: ALT 20; Hemoglobin 11.7; Magnesium 2.4; Platelets 115; TSH 0.836 06/02/2017: BUN 40; Creatinine, Ser 1.57; Potassium 4.2; Sodium 135  Recent Lipid Panel    Component Value Date/Time   CHOL 135 03/30/2016 0652   TRIG 68 03/30/2016 0652   HDL 38 (L) 03/30/2016 0652   CHOLHDL 3.6 03/30/2016 0652   VLDL 14 03/30/2016 0652   LDLCALC 83 03/30/2016 0652    Physical Exam:    VS:  BP 138/74   Pulse (!) 52   Ht 5\' 10"  (1.778 m)   Wt 158 lb (71.7 kg)   SpO2 98%   BMI 22.67 kg/m     Wt Readings from Last 3 Encounters:  12/30/17 158 lb (71.7 kg)  08/31/17 158 lb (71.7 kg)  06/02/17 156 lb (70.8 kg)     GEN:  Well nourished, well developed in no acute distress HEENT: Normal NECK: No JVD; No carotid bruits LYMPHATICS: No  lymphadenopathy CARDIAC: RRR, soft systolic murmur right upper sternal border, no rubs, gallops RESPIRATORY:  Clear to auscultation without rales, wheezing or rhonchi  ABDOMEN: Soft, non-tender, non-distended MUSCULOSKELETAL:  No edema; No deformity  SKIN: Warm and dry, solar purpura  NEUROLOGIC:  Alert and oriented x 3 PSYCHIATRIC:  Normal affect   ASSESSMENT:    1. Tachycardia-bradycardia syndrome ([place])  2. Complete heart block ([place])   3. H/O aortic valve replacement   4. Cardiac pacemaker in situ   5. Solar purpura ([place])    PLAN:    In order of problems listed above:  Pacemaker for complete heart block -Dr. Lovena Le.  [employer].  Doing very well.  Still feeling some shortness of breath but he is very active, does all the house chores, cutting his grass etc.  Stated that he did feel the external pacer, worst pain of his life he states.  Aortic valve replacement -Bioprosthetic doing well.  Dental antibiotics.  Prior melena -No recurrence.  Hemoglobin 11.7  Hyperlipidemia -Continuing with low-dose atorvastatin.  Last LDL 55.  Orthostatic hypotension -Continue with salt liberalization.  Excellent blood pressure.  Solar purpura -Easy forearm bruising noted.   Medication Adjustments/Labs and Tests Ordered: Current medicines are reviewed at length with the patient today.  Concerns regarding medicines are outlined above.  No orders of the defined types were placed in this encounter.  No orders of the defined types were placed in this encounter.   Patient Instructions  Medication Instructions:  The current medical regimen is effective;  continue present plan and medications.  Follow-Up: Follow up in 6 months with Dr. Marlou Porch.  You will receive a letter in the mail 2 months before you are due.  Please call us when you receive this letter to schedule your follow up appointment.  If you need a refill on your cardiac medications before your next appointment, please call  your pharmacy.  Thank you for choosing [facility]!!        Signed, Candee Furbish, MD  12/30/2017 11:21 AM    [place]

## 2018-01-20 DIAGNOSIS — Z23 Encounter for immunization: Secondary | ICD-10-CM | POA: Diagnosis not present

## 2018-02-21 DIAGNOSIS — H2512 Age-related nuclear cataract, left eye: Secondary | ICD-10-CM | POA: Diagnosis not present

## 2018-03-01 ENCOUNTER — Ambulatory Visit (INDEPENDENT_AMBULATORY_CARE_PROVIDER_SITE_OTHER): Payer: Medicare Other

## 2018-03-01 DIAGNOSIS — I495 Sick sinus syndrome: Secondary | ICD-10-CM | POA: Diagnosis not present

## 2018-03-01 NOTE — Progress Notes (Signed)
Remote pacemaker transmission.   

## 2018-03-08 ENCOUNTER — Encounter: Payer: Self-pay | Admitting: Cardiology

## 2018-03-17 DIAGNOSIS — R7303 Prediabetes: Secondary | ICD-10-CM | POA: Diagnosis not present

## 2018-03-17 DIAGNOSIS — D696 Thrombocytopenia, unspecified: Secondary | ICD-10-CM | POA: Diagnosis not present

## 2018-03-17 DIAGNOSIS — E039 Hypothyroidism, unspecified: Secondary | ICD-10-CM | POA: Diagnosis not present

## 2018-03-17 DIAGNOSIS — Z1389 Encounter for screening for other disorder: Secondary | ICD-10-CM | POA: Diagnosis not present

## 2018-03-17 DIAGNOSIS — Z950 Presence of cardiac pacemaker: Secondary | ICD-10-CM | POA: Diagnosis not present

## 2018-03-17 DIAGNOSIS — Z0000 Encounter for general adult medical examination without abnormal findings: Secondary | ICD-10-CM | POA: Diagnosis not present

## 2018-03-17 DIAGNOSIS — Z952 Presence of prosthetic heart valve: Secondary | ICD-10-CM | POA: Diagnosis not present

## 2018-03-17 DIAGNOSIS — E7800 Pure hypercholesterolemia, unspecified: Secondary | ICD-10-CM | POA: Diagnosis not present

## 2018-03-17 DIAGNOSIS — I2510 Atherosclerotic heart disease of native coronary artery without angina pectoris: Secondary | ICD-10-CM | POA: Diagnosis not present

## 2018-03-17 DIAGNOSIS — M1990 Unspecified osteoarthritis, unspecified site: Secondary | ICD-10-CM | POA: Diagnosis not present

## 2018-03-17 DIAGNOSIS — H9193 Unspecified hearing loss, bilateral: Secondary | ICD-10-CM | POA: Diagnosis not present

## 2018-03-17 DIAGNOSIS — N183 Chronic kidney disease, stage 3 (moderate): Secondary | ICD-10-CM | POA: Diagnosis not present

## 2018-03-17 DIAGNOSIS — I713 Abdominal aortic aneurysm, ruptured: Secondary | ICD-10-CM | POA: Diagnosis not present

## 2018-03-17 DIAGNOSIS — I442 Atrioventricular block, complete: Secondary | ICD-10-CM | POA: Diagnosis not present

## 2018-04-04 ENCOUNTER — Ambulatory Visit (INDEPENDENT_AMBULATORY_CARE_PROVIDER_SITE_OTHER): Payer: Medicare Other

## 2018-04-04 ENCOUNTER — Other Ambulatory Visit (INDEPENDENT_AMBULATORY_CARE_PROVIDER_SITE_OTHER): Payer: Self-pay | Admitting: Vascular Surgery

## 2018-04-04 ENCOUNTER — Encounter (INDEPENDENT_AMBULATORY_CARE_PROVIDER_SITE_OTHER): Payer: Self-pay | Admitting: Vascular Surgery

## 2018-04-04 ENCOUNTER — Ambulatory Visit (INDEPENDENT_AMBULATORY_CARE_PROVIDER_SITE_OTHER): Payer: Medicare Other | Admitting: Vascular Surgery

## 2018-04-04 VITALS — BP 120/70 | HR 59 | Resp 14 | Ht 70.0 in | Wt 156.8 lb

## 2018-04-04 DIAGNOSIS — I713 Abdominal aortic aneurysm, ruptured, unspecified: Secondary | ICD-10-CM

## 2018-04-04 DIAGNOSIS — R0989 Other specified symptoms and signs involving the circulatory and respiratory systems: Secondary | ICD-10-CM

## 2018-04-04 DIAGNOSIS — M79606 Pain in leg, unspecified: Secondary | ICD-10-CM

## 2018-04-04 DIAGNOSIS — I442 Atrioventricular block, complete: Secondary | ICD-10-CM | POA: Diagnosis not present

## 2018-04-04 DIAGNOSIS — I2583 Coronary atherosclerosis due to lipid rich plaque: Secondary | ICD-10-CM | POA: Diagnosis not present

## 2018-04-04 DIAGNOSIS — I10 Essential (primary) hypertension: Secondary | ICD-10-CM | POA: Diagnosis not present

## 2018-04-04 DIAGNOSIS — F17220 Nicotine dependence, chewing tobacco, uncomplicated: Secondary | ICD-10-CM

## 2018-04-04 DIAGNOSIS — I2510 Atherosclerotic heart disease of native coronary artery without angina pectoris: Secondary | ICD-10-CM

## 2018-04-06 ENCOUNTER — Encounter (INDEPENDENT_AMBULATORY_CARE_PROVIDER_SITE_OTHER): Payer: Self-pay | Admitting: Vascular Surgery

## 2018-04-06 NOTE — Progress Notes (Signed)
MRN : [id number]  Jay [name] is a 85 y.o. (08/16/1925) male who presents with chief complaint of  Chief Complaint  Patient presents with  . Follow-up    1 YEAR  .  History of Present Illness:   The patient returns to the office for follow up of a ruptureabdominal aortic aneurysm status post stent graft placement on 06/23/2016.  Patient denies abdominal pain or back pain, no other abdominal complaints. No groin related complaints.He does give a history of right hip claudication.No symptoms consistent with distal embolization   There have been no interval changes in his overall healthcare since his last visit.   Patient denies amaurosis fugax or TIA symptoms. There is no history of claudication or rest pain symptoms of the lower extremities. The patient denies angina or shortness of breath.   Duplex US of the aorta and iliac arteries done today shows a6.0AAA sac with noendoleak.  Previous duplex US of the aorta and iliac arteries shows a6.4AAA sac with noendoleak, decreasein the sac compared to the previous study.  Current Meds  Medication Sig  . albuterol (PROVENTIL HFA;VENTOLIN HFA) 108 (90 BASE) MCG/ACT inhaler Inhale 2 puffs into the lungs every 6 (six) hours as needed for wheezing or shortness of breath.  [name] aspirin EC 81 MG tablet Take 1 tablet (81 mg total) by mouth daily.  [name] atorvastatin (LIPITOR) 10 MG tablet Take 10 mg by mouth daily.  . clindamycin (CLEOCIN) 300 MG capsule Take 300 mg by mouth 3 (three) times daily.  [name] levothyroxine (SYNTHROID, LEVOTHROID) 88 MCG tablet Take 88 mcg by mouth daily before breakfast.  . saw palmetto 500 MG capsule Take 500 mg by mouth daily.    Past Medical History:  Diagnosis Date  . Allergic rhinitis    ,RAD wheezing  . Cataracts, bilateral   . Diverticulosis    , ACBE, flex, in 2003  . Dyslipidemia   . Fracture of left clavicle    , Hx  . Hearing loss    , Bilateral  . Hypothyroidism   . Insomnia   .  Kidney stone   . OA (optic atrophy)   . Osteoarthritis   . Pelvic fracture (HCC)    , Hx  . RBBB (right bundle branch block)    , seen on EKG  . Varicose veins     Past Surgical History:  Procedure Laterality Date  . ABDOMINAL AORTOGRAM N/A 06/22/2016   Procedure: Abdominal Aortogram;  Surgeon: Katha Cabal, MD;  Location: [place] [facility];  Service: Cardiovascular;  Laterality: N/A;  . AORTIC INTERVENTION N/A 06/22/2016   Procedure: Aortic Intervention;  Surgeon: Katha Cabal, MD;  Location: [place] [facility];  Service: Cardiovascular;  Laterality: N/A;  . AORTIC VALVE REPLACEMENT  2011  . CATARACT EXTRACTION W/PHACO Right 02/17/2016   Procedure: CATARACT EXTRACTION PHACO AND INTRAOCULAR LENS PLACEMENT (IOC);  Surgeon: Ronnell Freshwater, MD;  Location: [place];  Service: Ophthalmology;  Laterality: Right;  RIGHT  . INGUINAL HERNIA REPAIR Bilateral   . Orchectomy Right   . PACEMAKER IMPLANT N/A 05/31/2017   Procedure: PACEMAKER IMPLANT;  Surgeon: Evans Lance, MD;  Location: [place] [facility];  Service: Cardiovascular;  Laterality: N/A;  . TEMPORARY PACEMAKER N/A 05/31/2017   Procedure: TEMPORARY PACEMAKER;  Surgeon: Sherren Mocha, MD;  Location: [place] [facility];  Service: Cardiovascular;  Laterality: N/A;  . THYROIDECTOMY      Social History Social History   Tobacco Use  .  Smoking status: Former Smoker    Last attempt to quit: 03/30/1965    Years since quitting: 53.0  . Smokeless tobacco: Current User    Types: Chew  Substance Use Topics  . Alcohol use: No    Comment: Rarely  . Drug use: No    Family History Family History  Problem Relation Age of Onset  . Diabetes Mellitus II Mother     Allergies  Allergen Reactions  . Aleve [Naproxen Sodium] Hives  . Codeine Other (See Comments)    unknown  . Penicillin G Benzathine     Pt doesn't remember reaction     REVIEW OF SYSTEMS (Negative unless checked)  Constitutional:  [] Weight loss  [] Fever  [] Chills Cardiac: [x] Chest pain   [] Chest pressure   [] Palpitations   [] Shortness of breath when laying flat   [x] Shortness of breath with exertion. Vascular:  [x] Pain in legs with walking   [] Pain in legs at rest  [] History of DVT   [] Phlebitis   [] Swelling in legs   [] Varicose veins   [] Non-healing ulcers Pulmonary:   [] Uses home oxygen   [] Productive cough   [] Hemoptysis   [] Wheeze  [] COPD   [] Asthma Neurologic:  [] Dizziness   [] Seizures   [] History of stroke   [] History of TIA  [] Aphasia   [] Vissual changes   [] Weakness or numbness in arm   [] Weakness or numbness in leg Musculoskeletal:   [] Joint swelling   [x] Joint pain   [x] Low back pain Hematologic:  [] Easy bruising  [] Easy bleeding   [] Hypercoagulable state   [] Anemic Gastrointestinal:  [] Diarrhea   [] Vomiting  [] Gastroesophageal reflux/heartburn   [] Difficulty swallowing. Genitourinary:  [] Chronic kidney disease   [] Difficult urination  [] Frequent urination   [] Blood in urine Skin:  [] Rashes   [] Ulcers  Psychological:  [] History of anxiety   []  History of major depression.  Physical Examination  Vitals:   04/04/18 0935  BP: 120/70  Pulse: (!) 59  Resp: 14  Weight: 156 lb 12.8 oz (71.1 kg)  Height: 5\' 10"  (1.778 m)   Body mass index is 22.5 kg/m. Gen: WD/WN, NAD, frail-appearing Head: [place]/AT, No temporalis wasting.  Ear/Nose/Throat: Hearing grossly intact, nares w/o erythema or drainage Eyes: PER, EOMI, sclera nonicteric.  Neck: Supple, no large masses.   Pulmonary:  Good air movement, no audible wheezing bilaterally, no use of accessory muscles.  Cardiac: RRR, no JVD Vascular:  Vessel Right Left  Radial Palpable Palpable  PT Palpable Palpable  DP Palpable Palpable  Gastrointestinal: Non-distended. No guarding/no peritoneal signs.  Musculoskeletal: M/S 5/5 throughout.  Mild kyphosis deformity diffuse mild atrophy bilateral upper and lower extremities.  Neurologic: CN 2-12 intact. Symmetrical.   Speech is fluent. Motor exam as listed above. Psychiatric: Judgment intact, Mood & affect appropriate for pt's clinical situation. Dermatologic: No rashes or ulcers noted.  No changes consistent with cellulitis. Lymph : No lichenification or skin changes of chronic lymphedema.  CBC Lab Results  Component Value Date   WBC 7.4 05/30/2017   HGB 11.7 (L) 05/30/2017   HCT 36.4 (L) 05/30/2017   MCV 89.4 05/30/2017   PLT 115 (L) 05/30/2017    BMET    Component Value Date/Time   NA 135 06/02/2017 0920   K 4.2 06/02/2017 0920   CL 99 (L) 06/02/2017 0920   CO2 26 06/02/2017 0920   GLUCOSE 134 (H) 06/02/2017 0920   BUN 40 (H) 06/02/2017 0920   CREATININE 1.57 (H) 06/02/2017 0920   CALCIUM 8.3 (L) 06/02/2017 0920  GFRNONAA 37 (L) 06/02/2017 0920   GFRAA 43 (L) 06/02/2017 0920   CrCl cannot be calculated (Patient's most recent lab result is older than the maximum 21 days allowed.).  COAG Lab Results  Component Value Date   INR 1.14 05/30/2017   INR 1.16 06/22/2016   INR 1.12 03/30/2016    Radiology No results found.   Assessment/Plan 1. Ruptured abdominal aortic aneurysm (AAA) (HCC) Recommend: Patient is status post successful endovascular repair of the AAA.   No further intervention is required at this time.   No endoleak is detected and the aneurysm sac is stable.  The patient will continue antiplatelet therapy as prescribed as well as aggressive management of hyperlipidemia. Exercise is again strongly encouraged.   However, endografts require continued surveillance with ultrasound or CT scan. This is mandatory to detect any changes that allow repressurization of the aneurysm sac.  The patient is informed that this would be asymptomatic.  The patient is reminded that lifelong routine surveillance is a necessity with an endograft. Patient will continue to follow-up at 12 month intervals with ultrasound of the aorta.  2. Coronary artery disease involving native coronary  artery of native heart without angina pectoris Continue cardiac and antihypertensive medications as already ordered and reviewed, no changes at this time.  Continue statin as ordered and reviewed, no changes at this time  Nitrates PRN for chest pain   3. Coronary artery disease due to lipid rich plaque Continue cardiac and antihypertensive medications as already ordered and reviewed, no changes at this time.  Continue statin as ordered and reviewed, no changes at this time  Nitrates PRN for chest pain   4. Complete heart block [facility]) Patient now has a pacemaker placed.  Continue antiarrhythmia medications as already ordered, these medications have been reviewed and there are no changes at this time.  5. Essential hypertension Continue antihypertensive medications as already ordered, these medications have been reviewed and there are no changes at this time.     Hortencia Pilar, MD  04/06/2018 6:25 PM

## 2018-04-21 LAB — CUP PACEART REMOTE DEVICE CHECK
Battery Remaining Longevity: 102 mo
Battery Remaining Percentage: 95.5 %
Battery Voltage: 3.02 V
Brady Statistic AP VP Percent: 61 %
Brady Statistic AP VS Percent: 1 %
Brady Statistic AS VP Percent: 36 %
Brady Statistic AS VS Percent: 1 %
Brady Statistic RA Percent Paced: 56 %
Brady Statistic RV Percent Paced: 97 %
Date Time Interrogation Session: 20191203070014
Implantable Lead Implant Date: 20190304
Implantable Lead Implant Date: 20190304
Implantable Lead Location: 753859
Implantable Lead Location: 753860
Implantable Pulse Generator Implant Date: 20190304
Lead Channel Impedance Value: 410 Ohm
Lead Channel Impedance Value: 700 Ohm
Lead Channel Pacing Threshold Amplitude: 0.5 V
Lead Channel Pacing Threshold Amplitude: 0.75 V
Lead Channel Pacing Threshold Pulse Width: 0.5 ms
Lead Channel Pacing Threshold Pulse Width: 0.5 ms
Lead Channel Sensing Intrinsic Amplitude: 12 mV
Lead Channel Sensing Intrinsic Amplitude: 3.1 mV
Lead Channel Setting Pacing Amplitude: 2 V
Lead Channel Setting Pacing Amplitude: 2.5 V
Lead Channel Setting Pacing Pulse Width: 0.5 ms
Lead Channel Setting Sensing Sensitivity: 4 mV
Pulse Gen Model: 2272
Pulse Gen Serial Number: 8992729

## 2018-05-31 ENCOUNTER — Ambulatory Visit (INDEPENDENT_AMBULATORY_CARE_PROVIDER_SITE_OTHER): Payer: Medicare Other | Admitting: *Deleted

## 2018-05-31 DIAGNOSIS — I495 Sick sinus syndrome: Secondary | ICD-10-CM

## 2018-05-31 LAB — CUP PACEART REMOTE DEVICE CHECK
Battery Remaining Longevity: 108 mo
Battery Remaining Percentage: 95.5 %
Battery Voltage: 3.01 V
Brady Statistic AP VP Percent: 59 %
Brady Statistic AP VS Percent: 1 %
Brady Statistic AS VP Percent: 38 %
Brady Statistic AS VS Percent: 1 %
Brady Statistic RA Percent Paced: 54 %
Brady Statistic RV Percent Paced: 97 %
Date Time Interrogation Session: 20200303070012
Implantable Lead Implant Date: 20190304
Implantable Lead Implant Date: 20190304
Implantable Lead Location: 753859
Implantable Lead Location: 753860
Implantable Pulse Generator Implant Date: 20190304
Lead Channel Impedance Value: 400 Ohm
Lead Channel Impedance Value: 700 Ohm
Lead Channel Pacing Threshold Amplitude: 0.5 V
Lead Channel Pacing Threshold Amplitude: 0.75 V
Lead Channel Pacing Threshold Pulse Width: 0.5 ms
Lead Channel Pacing Threshold Pulse Width: 0.5 ms
Lead Channel Sensing Intrinsic Amplitude: 12 mV
Lead Channel Sensing Intrinsic Amplitude: 2.6 mV
Lead Channel Setting Pacing Amplitude: 2 V
Lead Channel Setting Pacing Amplitude: 2.5 V
Lead Channel Setting Pacing Pulse Width: 0.5 ms
Lead Channel Setting Sensing Sensitivity: 4 mV
Pulse Gen Model: 2272
Pulse Gen Serial Number: 8992729

## 2018-06-08 ENCOUNTER — Encounter: Payer: Self-pay | Admitting: Cardiology

## 2018-06-08 NOTE — Progress Notes (Signed)
Remote pacemaker transmission.   

## 2018-06-16 ENCOUNTER — Telehealth: Payer: Self-pay

## 2018-06-16 NOTE — Telephone Encounter (Signed)
Called pt to postpone his 3/26 6 mo F/U appt d/t COVID19  Pt and pts spouse adamant about keeping current appt Pt reports no acute symptoms at this time

## 2018-06-23 ENCOUNTER — Ambulatory Visit: Payer: Medicare Other | Admitting: Cardiology

## 2018-07-15 ENCOUNTER — Telehealth: Payer: Self-pay | Admitting: *Deleted

## 2018-07-15 NOTE — Telephone Encounter (Signed)
Attempted to contact patient by phone to discuss need to change appointment to a virtual visit. Per wife, patient is laying down and is too [place] to talk on the phone.  Explained to wife I was calling to discuss with patient completing his upcoming appointment (4/24 @ 3 pm) by phone.  Wife became very frustrated and stated "that will never work."  Asked if patient is currently having any cardiac problems to which she responded "No" but also added "when you are a heart patient, you need to be able to see your doctor."  I reassured her I completely understand however at this point in time virtual visits are all we have.  Requested she speak with her husband and their daughter about the possibility of her helping with a phone visit.  Wife stated she would and they will call back.

## 2018-07-18 NOTE — Telephone Encounter (Signed)
Pt has been schedule to 8/18.

## 2018-07-22 ENCOUNTER — Ambulatory Visit: Payer: Medicare Other | Admitting: Cardiology

## 2018-08-30 ENCOUNTER — Ambulatory Visit (INDEPENDENT_AMBULATORY_CARE_PROVIDER_SITE_OTHER): Payer: Medicare Other | Admitting: *Deleted

## 2018-08-30 DIAGNOSIS — I495 Sick sinus syndrome: Secondary | ICD-10-CM | POA: Diagnosis not present

## 2018-08-31 LAB — CUP PACEART REMOTE DEVICE CHECK
Battery Remaining Longevity: 107 mo
Battery Remaining Percentage: 95.5 %
Battery Voltage: 3.01 V
Brady Statistic AP VP Percent: 60 %
Brady Statistic AP VS Percent: 1 %
Brady Statistic AS VP Percent: 38 %
Brady Statistic AS VS Percent: 1 %
Brady Statistic RA Percent Paced: 56 %
Brady Statistic RV Percent Paced: 98 %
Date Time Interrogation Session: 20200602060014
Implantable Lead Implant Date: 20190304
Implantable Lead Implant Date: 20190304
Implantable Lead Location: 753859
Implantable Lead Location: 753860
Implantable Pulse Generator Implant Date: 20190304
Lead Channel Impedance Value: 400 Ohm
Lead Channel Impedance Value: 650 Ohm
Lead Channel Pacing Threshold Amplitude: 0.5 V
Lead Channel Pacing Threshold Amplitude: 0.75 V
Lead Channel Pacing Threshold Pulse Width: 0.5 ms
Lead Channel Pacing Threshold Pulse Width: 0.5 ms
Lead Channel Sensing Intrinsic Amplitude: 12 mV
Lead Channel Sensing Intrinsic Amplitude: 2.9 mV
Lead Channel Setting Pacing Amplitude: 2 V
Lead Channel Setting Pacing Amplitude: 2.5 V
Lead Channel Setting Pacing Pulse Width: 0.5 ms
Lead Channel Setting Sensing Sensitivity: 4 mV
Pulse Gen Model: 2272
Pulse Gen Serial Number: 8992729

## 2018-09-01 ENCOUNTER — Encounter: Payer: Medicare Other | Admitting: Internal Medicine

## 2018-09-07 ENCOUNTER — Encounter: Payer: Self-pay | Admitting: Cardiology

## 2018-09-07 NOTE — Progress Notes (Signed)
Remote pacemaker transmission.   

## 2018-09-22 DIAGNOSIS — R7303 Prediabetes: Secondary | ICD-10-CM | POA: Diagnosis not present

## 2018-09-22 DIAGNOSIS — I442 Atrioventricular block, complete: Secondary | ICD-10-CM | POA: Diagnosis not present

## 2018-09-22 DIAGNOSIS — I2510 Atherosclerotic heart disease of native coronary artery without angina pectoris: Secondary | ICD-10-CM | POA: Diagnosis not present

## 2018-09-22 DIAGNOSIS — Z952 Presence of prosthetic heart valve: Secondary | ICD-10-CM | POA: Diagnosis not present

## 2018-09-22 DIAGNOSIS — H9193 Unspecified hearing loss, bilateral: Secondary | ICD-10-CM | POA: Diagnosis not present

## 2018-09-22 DIAGNOSIS — Z950 Presence of cardiac pacemaker: Secondary | ICD-10-CM | POA: Diagnosis not present

## 2018-09-22 DIAGNOSIS — I700 Atherosclerosis of aorta: Secondary | ICD-10-CM | POA: Diagnosis not present

## 2018-09-22 DIAGNOSIS — J309 Allergic rhinitis, unspecified: Secondary | ICD-10-CM | POA: Diagnosis not present

## 2018-09-22 DIAGNOSIS — E039 Hypothyroidism, unspecified: Secondary | ICD-10-CM | POA: Diagnosis not present

## 2018-10-26 DIAGNOSIS — N390 Urinary tract infection, site not specified: Secondary | ICD-10-CM | POA: Diagnosis not present

## 2018-11-15 ENCOUNTER — Encounter: Payer: Self-pay | Admitting: Cardiology

## 2018-11-15 ENCOUNTER — Other Ambulatory Visit: Payer: Self-pay

## 2018-11-15 ENCOUNTER — Ambulatory Visit (INDEPENDENT_AMBULATORY_CARE_PROVIDER_SITE_OTHER): Payer: Medicare Other | Admitting: Cardiology

## 2018-11-15 VITALS — BP 112/60 | HR 70 | Ht 70.0 in | Wt 155.2 lb

## 2018-11-15 DIAGNOSIS — I2583 Coronary atherosclerosis due to lipid rich plaque: Secondary | ICD-10-CM

## 2018-11-15 DIAGNOSIS — I951 Orthostatic hypotension: Secondary | ICD-10-CM

## 2018-11-15 DIAGNOSIS — I442 Atrioventricular block, complete: Secondary | ICD-10-CM | POA: Diagnosis not present

## 2018-11-15 DIAGNOSIS — I2510 Atherosclerotic heart disease of native coronary artery without angina pectoris: Secondary | ICD-10-CM

## 2018-11-15 DIAGNOSIS — Z952 Presence of prosthetic heart valve: Secondary | ICD-10-CM

## 2018-11-15 NOTE — Progress Notes (Signed)
Cardiology Office Note:    Date:  11/15/2018   ID:  Jay [name], DOB [birth year], MRN [id number]  PCP:  Jerline Pain, MD  Cardiologist:  No primary care provider on file.  Electrophysiologist:  None   Referring MD: Wenda Low, MD     History of Present Illness:    Jay [name] is a 85 y.o. male with complete heart block underwent emergency pacemaker placement in March 2019 Dr. Lovena Le, doing quite well. Here for follow up AVR, pacer.  Still having chronic dyspnea on exertion.  No changes for him.  Pacemaker is [employer].  He has an aortic valve replacement #23 pericardial Edwards valve placed on 10/31/2009.  Prior chronic right bundle branch block.  Abdominal aortic aneurysm was treated at [facility].  Has 30% LAD disease, left circumflex with mild aneurysmal dilatation but no stenosis.  Right coronary was small and nondominant.  His wife had an myocardial infarction in the summer 2013 and Dr. Burt Knack took care of her.  His children live behind him on his property. Helps his wife. Duaghter was at [facility] yesterday, myasthenia type condition.   No further melena.  Overall doing well. No CP, no SOB.     Past Medical History:  Diagnosis Date  . Allergic rhinitis    ,RAD wheezing  . Cataracts, bilateral   . Diverticulosis    , ACBE, flex, in 2003  . Dyslipidemia   . Fracture of left clavicle    , Hx  . Hearing loss    , Bilateral  . Hypothyroidism   . Insomnia   . Kidney stone   . OA (optic atrophy)   . Osteoarthritis   . Pelvic fracture (HCC)    , Hx  . RBBB (right bundle branch block)    , seen on EKG  . Varicose veins     Past Surgical History:  Procedure Laterality Date  . ABDOMINAL AORTOGRAM N/A 06/22/2016   Procedure: Abdominal Aortogram;  Surgeon: Katha Cabal, MD;  Location: [place] [facility];  Service: Cardiovascular;  Laterality: N/A;  . AORTIC INTERVENTION N/A 06/22/2016   Procedure: Aortic Intervention;  Surgeon: Katha Cabal, MD;   Location: [place] [facility];  Service: Cardiovascular;  Laterality: N/A;  . AORTIC VALVE REPLACEMENT  2011  . CATARACT EXTRACTION W/PHACO Right 02/17/2016   Procedure: CATARACT EXTRACTION PHACO AND INTRAOCULAR LENS PLACEMENT (IOC);  Surgeon: Ronnell Freshwater, MD;  Location: [place];  Service: Ophthalmology;  Laterality: Right;  RIGHT  . INGUINAL HERNIA REPAIR Bilateral   . Orchectomy Right   . PACEMAKER IMPLANT N/A 05/31/2017   Procedure: PACEMAKER IMPLANT;  Surgeon: Evans Lance, MD;  Location: [place] [facility];  Service: Cardiovascular;  Laterality: N/A;  . TEMPORARY PACEMAKER N/A 05/31/2017   Procedure: TEMPORARY PACEMAKER;  Surgeon: Sherren Mocha, MD;  Location: [place] [facility];  Service: Cardiovascular;  Laterality: N/A;  . THYROIDECTOMY      Current Medications: Current Meds  Medication Sig  . albuterol (PROVENTIL HFA;VENTOLIN HFA) 108 (90 BASE) MCG/ACT inhaler Inhale 2 puffs into the lungs every 6 (six) hours as needed for wheezing or shortness of breath.  [name] aspirin EC 81 MG tablet Take 1 tablet (81 mg total) by mouth daily.  [name] atorvastatin (LIPITOR) 10 MG tablet Take 10 mg by mouth daily.  [name] levothyroxine (SYNTHROID, LEVOTHROID) 88 MCG tablet Take 88 mcg by mouth daily before breakfast.  . saw palmetto 500 MG capsule Take 500 mg by mouth  daily.     Allergies:   Aleve [naproxen sodium], Codeine, and Penicillin g benzathine   Social History   Socioeconomic History  . Marital status: Married    Spouse name: Not on file  . Number of children: Not on file  . Years of education: Not on file  . Highest education level: Not on file  Occupational History  . Not on file  Social Needs  . Financial resource strain: Not on file  . Food insecurity    Worry: Not on file    Inability: Not on file  . Transportation needs    Medical: Not on file    Non-medical: Not on file  Tobacco Use  . Smoking status: Former Smoker    Quit date: 03/30/1965    Years  since quitting: 53.6  . Smokeless tobacco: Current User    Types: Chew  Substance and Sexual Activity  . Alcohol use: No    Comment: Rarely  . Drug use: No  . Sexual activity: Not on file  Lifestyle  . Physical activity    Days per week: Not on file    Minutes per session: Not on file  . Stress: Not on file  Relationships  . Social Herbalist on phone: Not on file    Gets together: Not on file    Attends religious service: Not on file    Active member of club or organization: Not on file    Attends meetings of clubs or organizations: Not on file    Relationship status: Not on file  Other Topics Concern  . Not on file  Social History Narrative  . Not on file     Family History: The patient's family history includes Diabetes Mellitus II in his mother.  ROS:   Please see the history of present illness.    Hard of hearing.  Sometimes fatigued in the mid day.  All other systems reviewed and are negative.  EKGs/Labs/Other Studies Reviewed:    The following studies were reviewed today: Prior discharge summary hospital notes lab work office notes reviewed.  ECHO 2019 - Left ventricle: The cavity size was mildly dilated. There was   mild concentric hypertrophy. Systolic function was normal. The   estimated ejection fraction was in the range of 60% to 65%. Wall   motion was normal; there were no regional wall motion   abnormalities. The study is not technically sufficient to allow   evaluation of LV diastolic function due to underlying second   degree AV block. - Aortic valve: Poorly visualized. A 2.3 cmpericardial   bioprosthesis was present and functioning normally. Mean gradient   (S): 10 mm Hg. Valve area (VTI): 1.33 cm^2. Valve area (Vmax):   1.29 cm^2. Valve area (Vmean): 1.4 cm^2. - Left atrium: The atrium was mildly dilated. - Pulmonary arteries: PA peak pressure: 64 mm Hg (S).  EKG:  None today  Recent Labs: No results found for requested labs within  last 8760 hours.  Recent Lipid Panel    Component Value Date/Time   CHOL 135 03/30/2016 0652   TRIG 68 03/30/2016 0652   HDL 38 (L) 03/30/2016 0652   CHOLHDL 3.6 03/30/2016 0652   VLDL 14 03/30/2016 0652   LDLCALC 83 03/30/2016 0652    Physical Exam:    VS:  BP 112/60   Pulse 70   Ht 5\' 10"  (1.778 m)   Wt 155 lb 3.2 oz (70.4 kg)   SpO2 90%  BMI 22.27 kg/m     Wt Readings from Last 3 Encounters:  11/15/18 155 lb 3.2 oz (70.4 kg)  04/04/18 156 lb 12.8 oz (71.1 kg)  12/30/17 158 lb (71.7 kg)     GEN:  Well nourished, well developed in no acute distress HEENT: Normal NECK: No JVD; No carotid bruits LYMPHATICS: No lymphadenopathy CARDIAC: RRR, soft systolic murmur right upper sternal border, no rubs, gallops RESPIRATORY:  Clear to auscultation without rales, wheezing or rhonchi  ABDOMEN: Soft, non-tender, non-distended MUSCULOSKELETAL:  No edema; No deformity  SKIN: Warm and dry, solar purpura  NEUROLOGIC:  Alert and oriented x 3 PSYCHIATRIC:  Normal affect   ASSESSMENT:    1. Complete heart block ([employer])   2. H/O aortic valve replacement   3. Orthostatic hypotension    PLAN:    In order of problems listed above:  Pacemaker for complete heart block -Dr. Lovena Le.  [employer].  Doing very well.  Still feeling some shortness of breath but he is very active, does all the house chores, cutting his grass etc.  Stated that he did feel the external pacer, worst pain of his life he states. Doing great.   Aortic valve replacement -Bioprosthetic doing well.  Dental antibiotics. No changes  Prior melena -No recurrence.  Hemoglobin 12.5  Hyperlipidemia -Continuing with low-dose atorvastatin.  Last LDL 59.  Orthostatic hypotension -Continue with salt liberalization.  Excellent blood pressure. Fluids if BP low. 90/56. After sleep.   Solar purpura -Easy forearm bruising noted.  AAA repair  - stable.   1 year follow-up  Medication Adjustments/Labs and Tests Ordered:  Current medicines are reviewed at length with the patient today.  Concerns regarding medicines are outlined above.  No orders of the defined types were placed in this encounter.  No orders of the defined types were placed in this encounter.   Patient Instructions  Medication Instructions:  The current medical regimen is effective;  continue present plan and medications.  If you need a refill on your cardiac medications before your next appointment, please call your pharmacy.   Follow-Up: At [facility], you and your health needs are our priority.  As part of our continuing mission to provide you with exceptional heart care, we have created designated Provider Care Teams.  These Care Teams include your primary Cardiologist (physician) and Advanced Practice Providers (APPs -  Physician Assistants and Nurse Practitioners) who all work together to provide you with the care you need, when you need it. You will need a follow up appointment in 12 months.  Please call our office 2 months in advance to schedule this appointment.  You may see Dr Candee Furbish. or one of the following Advanced Practice Providers on your designated Care Team:   Truitt Merle, NP Cecilie Kicks, NP . Kathyrn Drown, NP      Signed, Candee Furbish, MD  11/15/2018 12:31 PM    [place]

## 2018-11-15 NOTE — Patient Instructions (Signed)
Medication Instructions:  The current medical regimen is effective;  continue present plan and medications.  If you need a refill on your cardiac medications before your next appointment, please call your pharmacy.   Follow-Up: At [facility], you and your health needs are our priority.  As part of our continuing mission to provide you with exceptional heart care, we have created designated Provider Care Teams.  These Care Teams include your primary Cardiologist (physician) and Advanced Practice Providers (APPs -  Physician Assistants and Nurse Practitioners) who all work together to provide you with the care you need, when you need it. You will need a follow up appointment in 12 months.  Please call our office 2 months in advance to schedule this appointment.  You may see Dr Candee Furbish. or one of the following Advanced Practice Providers on your designated Care Team:   Truitt Merle, NP Cecilie Kicks, NP . Kathyrn Drown, NP

## 2018-11-18 ENCOUNTER — Telehealth: Payer: Self-pay

## 2018-11-18 NOTE — Telephone Encounter (Signed)
Spoke with pts wife regarding appt on 11/21/18. Pts wife stated pt has not been in contact with anyone who may have covid-19 and has no symptoms.

## 2018-11-21 ENCOUNTER — Ambulatory Visit (INDEPENDENT_AMBULATORY_CARE_PROVIDER_SITE_OTHER): Payer: Medicare Other | Admitting: Internal Medicine

## 2018-11-21 ENCOUNTER — Other Ambulatory Visit: Payer: Self-pay

## 2018-11-21 VITALS — BP 144/74 | HR 62 | Ht 70.0 in | Wt 157.0 lb

## 2018-11-21 DIAGNOSIS — Z950 Presence of cardiac pacemaker: Secondary | ICD-10-CM | POA: Diagnosis not present

## 2018-11-21 DIAGNOSIS — I2510 Atherosclerotic heart disease of native coronary artery without angina pectoris: Secondary | ICD-10-CM

## 2018-11-21 DIAGNOSIS — I442 Atrioventricular block, complete: Secondary | ICD-10-CM

## 2018-11-21 DIAGNOSIS — I2583 Coronary atherosclerosis due to lipid rich plaque: Secondary | ICD-10-CM

## 2018-11-21 LAB — CUP PACEART INCLINIC DEVICE CHECK
Battery Remaining Longevity: 109 mo
Battery Voltage: 3.01 V
Brady Statistic RA Percent Paced: 56 %
Brady Statistic RV Percent Paced: 98 %
Date Time Interrogation Session: 20200824175300
Implantable Lead Implant Date: 20190304
Implantable Lead Implant Date: 20190304
Implantable Lead Location: 753859
Implantable Lead Location: 753860
Implantable Pulse Generator Implant Date: 20190304
Lead Channel Impedance Value: 437.5 Ohm
Lead Channel Impedance Value: 700 Ohm
Lead Channel Pacing Threshold Amplitude: 0.5 V
Lead Channel Pacing Threshold Amplitude: 0.75 V
Lead Channel Pacing Threshold Pulse Width: 0.5 ms
Lead Channel Pacing Threshold Pulse Width: 0.5 ms
Lead Channel Sensing Intrinsic Amplitude: 3.7 mV
Lead Channel Setting Pacing Amplitude: 2 V
Lead Channel Setting Pacing Amplitude: 2.5 V
Lead Channel Setting Pacing Pulse Width: 0.5 ms
Lead Channel Setting Sensing Sensitivity: 4 mV
Pulse Gen Model: 2272
Pulse Gen Serial Number: 8992729

## 2018-11-21 NOTE — Patient Instructions (Signed)
Medication Instructions:  Your physician recommends that you continue on your current medications as directed. Please refer to the Current Medication list given to you today.  If you need a refill on your cardiac medications before your next appointment, please call your pharmacy.   Lab work: None Ordered   Testing/Procedures: None Ordered   Follow-Up: Your physician recommends that you schedule a follow-up appointment in: 1 year with Dr. Taylor  

## 2018-11-21 NOTE — Progress Notes (Signed)
HPI [name] returns today for PPM followup. He is a pleasant elderly man who developed CHB, and underwent emergent PPM approx 16 months ago. He has done well in the interim. No chest pain or syncope. He notes mild dyspnea with exertion. He wakes up early in the morning, and sits up and then feels better. He admits to dietary indiscretion with sodium. he describes periods/days where his energy is reduced although he has not had syncope.  Allergies  Allergen Reactions  . Aleve [Naproxen Sodium] Hives  . Codeine Other (See Comments)    unknown  . Penicillin G Benzathine     Pt doesn't remember reaction     Current Outpatient Medications  Medication Sig Dispense Refill  . albuterol (PROVENTIL HFA;VENTOLIN HFA) 108 (90 BASE) MCG/ACT inhaler Inhale 2 puffs into the lungs every 6 (six) hours as needed for wheezing or shortness of breath.    [name] aspirin EC 81 MG tablet Take 1 tablet (81 mg total) by mouth daily. 90 tablet 3  . atorvastatin (LIPITOR) 10 MG tablet Take 10 mg by mouth daily.    [name] levothyroxine (SYNTHROID, LEVOTHROID) 88 MCG tablet Take 88 mcg by mouth daily before breakfast.    . saw palmetto 500 MG capsule Take 500 mg by mouth daily.     No current facility-administered medications for this visit.      Past Medical History:  Diagnosis Date  . Allergic rhinitis    ,RAD wheezing  . Cataracts, bilateral   . Diverticulosis    , ACBE, flex, in 2003  . Dyslipidemia   . Fracture of left clavicle    , Hx  . Hearing loss    , Bilateral  . Hypothyroidism   . Insomnia   . Kidney stone   . OA (optic atrophy)   . Osteoarthritis   . Pelvic fracture (HCC)    , Hx  . RBBB (right bundle branch block)    , seen on EKG  . Varicose veins     ROS:   All systems reviewed and negative except as noted in the HPI.   Past Surgical History:  Procedure Laterality Date  . ABDOMINAL AORTOGRAM N/A 06/22/2016   Procedure: Abdominal Aortogram;  Surgeon: Katha Cabal,  MD;  Location: [facility];  Service: Cardiovascular;  Laterality: N/A;  . AORTIC INTERVENTION N/A 06/22/2016   Procedure: Aortic Intervention;  Surgeon: Katha Cabal, MD;  Location: [facility];  Service: Cardiovascular;  Laterality: N/A;  . AORTIC VALVE REPLACEMENT  2011  . CATARACT EXTRACTION W/PHACO Right 02/17/2016   Procedure: CATARACT EXTRACTION PHACO AND INTRAOCULAR LENS PLACEMENT (IOC);  Surgeon: Ronnell Freshwater, MD;  Location: [facility];  Service: Ophthalmology;  Laterality: Right;  RIGHT  . INGUINAL HERNIA REPAIR Bilateral   . Orchectomy Right   . PACEMAKER IMPLANT N/A 05/31/2017   Procedure: PACEMAKER IMPLANT;  Surgeon: Evans Lance, MD;  Location: [facility];  Service: Cardiovascular;  Laterality: N/A;  . TEMPORARY PACEMAKER N/A 05/31/2017   Procedure: TEMPORARY PACEMAKER;  Surgeon: Sherren Mocha, MD;  Location: [facility];  Service: Cardiovascular;  Laterality: N/A;  . THYROIDECTOMY       Family History  Problem Relation Age of Onset  . Diabetes Mellitus II Mother      Social History   Socioeconomic History  . Marital status: Married    Spouse name: Not on file  . Number of children: Not on file  .  Years of education: Not on file  . Highest education level: Not on file  Occupational History  . Not on file  Social Needs  . Financial resource strain: Not on file  . Food insecurity    Worry: Not on file    Inability: Not on file  . Transportation needs    Medical: Not on file    Non-medical: Not on file  Tobacco Use  . Smoking status: Former Smoker    Quit date: 03/30/1965    Years since quitting: 53.6  . Smokeless tobacco: Current User    Types: Chew  Substance and Sexual Activity  . Alcohol use: No    Comment: Rarely  . Drug use: No  . Sexual activity: Not on file  Lifestyle  . Physical activity    Days per week: Not on file    Minutes per session: Not on file  . Stress: Not on file   Relationships  . Social Herbalist on phone: Not on file    Gets together: Not on file    Attends religious service: Not on file    Active member of club or organization: Not on file    Attends meetings of clubs or organizations: Not on file    Relationship status: Not on file  . Intimate partner violence    Fear of current or ex partner: Not on file    Emotionally abused: Not on file    Physically abused: Not on file    Forced sexual activity: Not on file  Other Topics Concern  . Not on file  Social History Narrative  . Not on file     BP (!) 144/74   Pulse 62   Ht 5\' 10"  (1.778 m)   Wt 157 lb (71.2 kg)   SpO2 98%   BMI 22.53 kg/m   Physical Exam:  Well appearing NAD HEENT: Unremarkable Neck:  No JVD, no thyromegally Lymphatics:  No adenopathy Back:  No CVA tenderness Lungs:  Clear with no wheezes HEART:  Regular rate rhythm, no murmurs, no rubs, no clicks Abd:  soft, positive bowel sounds, no organomegally, no rebound, no guarding Ext:  2 plus pulses, no edema, no cyanosis, no clubbing Skin:  No rashes no nodules Neuro:  CN II through XII intact, motor grossly intact  EKG - nsr with ventricular pacing  DEVICE  Normal device function.  See PaceArt for details.   Assess/Plan: 1. CHB - he has no escape today. He is asymptomatic.  2. PPM - His [employer] DDD PM is working normally.  3. AS - he is s/p AVR and is doing well.   Mikle Bosworth.D.

## 2018-11-29 ENCOUNTER — Ambulatory Visit (INDEPENDENT_AMBULATORY_CARE_PROVIDER_SITE_OTHER): Payer: Medicare Other | Admitting: *Deleted

## 2018-11-29 DIAGNOSIS — I442 Atrioventricular block, complete: Secondary | ICD-10-CM

## 2018-11-29 DIAGNOSIS — I4510 Unspecified right bundle-branch block: Secondary | ICD-10-CM

## 2018-11-29 DIAGNOSIS — I959 Hypotension, unspecified: Secondary | ICD-10-CM

## 2018-11-29 HISTORY — DX: Hypotension, unspecified: I95.9

## 2018-11-29 LAB — CUP PACEART REMOTE DEVICE CHECK
Battery Remaining Longevity: 107 mo
Battery Remaining Percentage: 95.5 %
Battery Voltage: 3.01 V
Brady Statistic AP VP Percent: 64 %
Brady Statistic AP VS Percent: 1 %
Brady Statistic AS VP Percent: 36 %
Brady Statistic AS VS Percent: 1 %
Brady Statistic RA Percent Paced: 63 %
Brady Statistic RV Percent Paced: 99 %
Date Time Interrogation Session: 20200901060013
Implantable Lead Implant Date: 20190304
Implantable Lead Implant Date: 20190304
Implantable Lead Location: 753859
Implantable Lead Location: 753860
Implantable Pulse Generator Implant Date: 20190304
Lead Channel Impedance Value: 400 Ohm
Lead Channel Impedance Value: 660 Ohm
Lead Channel Pacing Threshold Amplitude: 0.5 V
Lead Channel Pacing Threshold Amplitude: 0.75 V
Lead Channel Pacing Threshold Pulse Width: 0.5 ms
Lead Channel Pacing Threshold Pulse Width: 0.5 ms
Lead Channel Sensing Intrinsic Amplitude: 12 mV
Lead Channel Sensing Intrinsic Amplitude: 3.2 mV
Lead Channel Setting Pacing Amplitude: 2 V
Lead Channel Setting Pacing Amplitude: 2.5 V
Lead Channel Setting Pacing Pulse Width: 0.5 ms
Lead Channel Setting Sensing Sensitivity: 4 mV
Pulse Gen Model: 2272
Pulse Gen Serial Number: 8992729

## 2018-12-13 NOTE — Progress Notes (Signed)
Remote pacemaker transmission.   

## 2018-12-17 ENCOUNTER — Encounter (HOSPITAL_COMMUNITY): Payer: Self-pay

## 2018-12-17 ENCOUNTER — Other Ambulatory Visit: Payer: Self-pay

## 2018-12-17 ENCOUNTER — Emergency Department (HOSPITAL_COMMUNITY)
Admission: EM | Admit: 2018-12-17 | Discharge: 2018-12-17 | Disposition: A | Discharge status: Home / Self Care | Payer: Medicare Other | Attending: Emergency Medicine | Admitting: Emergency Medicine

## 2018-12-17 ENCOUNTER — Emergency Department (HOSPITAL_COMMUNITY): Payer: Medicare Other

## 2018-12-17 DIAGNOSIS — I10 Essential (primary) hypertension: Secondary | ICD-10-CM | POA: Insufficient documentation

## 2018-12-17 DIAGNOSIS — R109 Unspecified abdominal pain: Secondary | ICD-10-CM | POA: Diagnosis not present

## 2018-12-17 DIAGNOSIS — I2510 Atherosclerotic heart disease of native coronary artery without angina pectoris: Secondary | ICD-10-CM | POA: Diagnosis not present

## 2018-12-17 DIAGNOSIS — K8020 Calculus of gallbladder without cholecystitis without obstruction: Secondary | ICD-10-CM | POA: Diagnosis not present

## 2018-12-17 DIAGNOSIS — Z7982 Long term (current) use of aspirin: Secondary | ICD-10-CM | POA: Insufficient documentation

## 2018-12-17 DIAGNOSIS — E039 Hypothyroidism, unspecified: Secondary | ICD-10-CM | POA: Insufficient documentation

## 2018-12-17 DIAGNOSIS — R1031 Right lower quadrant pain: Secondary | ICD-10-CM | POA: Diagnosis not present

## 2018-12-17 DIAGNOSIS — F17220 Nicotine dependence, chewing tobacco, uncomplicated: Secondary | ICD-10-CM | POA: Insufficient documentation

## 2018-12-17 DIAGNOSIS — Z79899 Other long term (current) drug therapy: Secondary | ICD-10-CM | POA: Insufficient documentation

## 2018-12-17 LAB — CBC
HCT: 37.8 % — ABNORMAL LOW (ref 39.0–52.0)
Hemoglobin: 12.3 g/dL — ABNORMAL LOW (ref 13.0–17.0)
MCH: 29.2 pg (ref 26.0–34.0)
MCHC: 32.5 g/dL (ref 30.0–36.0)
MCV: 89.8 fL (ref 80.0–100.0)
Platelets: 135 10*3/uL — ABNORMAL LOW (ref 150–400)
RBC: 4.21 MIL/uL — ABNORMAL LOW (ref 4.22–5.81)
RDW: 14.4 % (ref 11.5–15.5)
WBC: 6.2 10*3/uL (ref 4.0–10.5)
nRBC: 0 % (ref 0.0–0.2)

## 2018-12-17 LAB — URINALYSIS, ROUTINE W REFLEX MICROSCOPIC
Bacteria, UA: NONE SEEN
Bilirubin Urine: NEGATIVE
Glucose, UA: NEGATIVE mg/dL
Ketones, ur: NEGATIVE mg/dL
Leukocytes,Ua: NEGATIVE
Nitrite: NEGATIVE
Protein, ur: NEGATIVE mg/dL
Specific Gravity, Urine: 1.015 (ref 1.005–1.030)
pH: 5 (ref 5.0–8.0)

## 2018-12-17 LAB — COMPREHENSIVE METABOLIC PANEL
ALT: 19 U/L (ref 0–44)
AST: 29 U/L (ref 15–41)
Albumin: 3.7 g/dL (ref 3.5–5.0)
Alkaline Phosphatase: 90 U/L (ref 38–126)
Anion gap: 10 (ref 5–15)
BUN: 16 mg/dL (ref 8–23)
CO2: 26 mmol/L (ref 22–32)
Calcium: 9.1 mg/dL (ref 8.9–10.3)
Chloride: 102 mmol/L (ref 98–111)
Creatinine, Ser: 1.28 mg/dL — ABNORMAL HIGH (ref 0.61–1.24)
GFR calc Af Amer: 56 mL/min — ABNORMAL LOW (ref >60)
GFR calc non Af Amer: 49 mL/min — ABNORMAL LOW (ref >60)
Glucose, Bld: 114 mg/dL — ABNORMAL HIGH (ref 70–99)
Potassium: 3.7 mmol/L (ref 3.5–5.1)
Sodium: 138 mmol/L (ref 135–145)
Total Bilirubin: 1.1 mg/dL (ref 0.3–1.2)
Total Protein: 7.3 g/dL (ref 6.5–8.1)

## 2018-12-17 LAB — TYPE AND SCREEN
ABO/RH(D): O NEG
Antibody Screen: NEGATIVE

## 2018-12-17 LAB — POC OCCULT BLOOD, ED: Fecal Occult Bld: NEGATIVE

## 2018-12-17 MED ORDER — ACETAMINOPHEN 500 MG PO TABS: 1000.0000 mg | ORAL_TABLET | Freq: Once | ORAL | Status: DC

## 2018-12-17 NOTE — ED Provider Notes (Signed)
[facility] EMERGENCY DEPARTMENT Provider Note   CSN: [id number] Arrival date & time: 12/17/18  [id number]     History   Chief Complaint No chief complaint on file.   HPI Jay [name] is a 85 y.o. male.     Patient c/o acute onset right flank pain in the past day. Symptoms acute onset, mild-moderate, constant, persistent, non radiating, without specific exacerbating or alleviating factors. No dysuria although does have history recent uti. No nausea, vomiting, or diarrhea. Had bm this AM which pt states was almost black in color. No fever or chills. Normal appetite. No back trauma or strain, no fall.   The history is provided by the patient and a relative.    Past Medical History:  Diagnosis Date  . Allergic rhinitis    ,RAD wheezing  . Cataracts, bilateral   . Diverticulosis    , ACBE, flex, in 2003  . Dyslipidemia   . Fracture of left clavicle    , Hx  . Hearing loss    , Bilateral  . Hypothyroidism   . Insomnia   . Kidney stone   . OA (optic atrophy)   . Osteoarthritis   . Pelvic fracture (HCC)    , Hx  . RBBB (right bundle branch block)    , seen on EKG  . Varicose veins     Patient Active Problem List   Diagnosis Date Noted  . Complete heart block ([place]) 05/31/2017  . Second degree AV block, Mobitz type II 05/30/2017  . SOB (shortness of breath)   . Coronary artery disease due to lipid rich plaque   . Solar purpura ([place]) 12/28/2016  . 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 Chest pain 03/30/2016  . Essential hypertension 03/30/2016  . CAD (coronary artery disease), native coronary artery 03/30/2016  . Dizziness 03/30/2016  . H/O aortic valve replacement 10/03/2013  . Hypothyroidism   . Dyslipidemia   . RBBB (right bundle branch block)     Past Surgical History:  Procedure Laterality Date  . ABDOMINAL AORTOGRAM N/A 06/22/2016   Procedure: Abdominal Aortogram;  Surgeon: Katha Cabal, MD;  Location: [facility];  Service: Cardiovascular;  Laterality: N/A;  . AORTIC INTERVENTION N/A 06/22/2016   Procedure: Aortic Intervention;  Surgeon: Katha Cabal, MD;  Location: [facility];  Service: Cardiovascular;  Laterality: N/A;  . AORTIC VALVE REPLACEMENT  2011  . CATARACT EXTRACTION W/PHACO Right 02/17/2016   Procedure: CATARACT EXTRACTION PHACO AND INTRAOCULAR LENS PLACEMENT (IOC);  Surgeon: Ronnell Freshwater, MD;  Location: [facility];  Service: Ophthalmology;  Laterality: Right;  RIGHT  . INGUINAL HERNIA REPAIR Bilateral   . Orchectomy Right   . PACEMAKER IMPLANT N/A 05/31/2017   Procedure: PACEMAKER IMPLANT;  Surgeon: Evans Lance, MD;  Location: [facility];  Service: Cardiovascular;  Laterality: N/A;  . TEMPORARY PACEMAKER N/A 05/31/2017   Procedure: TEMPORARY PACEMAKER;  Surgeon: Sherren Mocha, MD;  Location: [facility];  Service: Cardiovascular;  Laterality: N/A;  . THYROIDECTOMY          Home Medications    Prior to Admission medications   Medication Sig Start Date End Date Taking? Authorizing Provider  albuterol (PROVENTIL HFA;VENTOLIN HFA) 108 (90 BASE) MCG/ACT inhaler Inhale 2 puffs into the lungs every 6 (six) hours as needed for wheezing or shortness of breath.    [provider]  aspirin EC 81 MG tablet Take 1 tablet (81  mg total) by mouth daily. 12/28/16   Jerline Pain, MD  atorvastatin (LIPITOR) 10 MG tablet Take 10 mg by mouth daily.    [provider]  levothyroxine (SYNTHROID, LEVOTHROID) 88 MCG tablet Take 88 mcg by mouth daily before breakfast.    [provider]  saw palmetto 500 MG capsule Take 500 mg by mouth daily.    [provider]    Family History Family History  Problem Relation Age of Onset  . Diabetes Mellitus II Mother     Social History Social History   Tobacco Use  . Smoking status: Former Smoker    Quit date: 03/30/1965    Years since quitting: 53.7  .  Smokeless tobacco: Current User    Types: Chew  Substance Use Topics  . Alcohol use: No    Comment: Rarely  . Drug use: No     Allergies   Aleve [naproxen sodium], Codeine, and Penicillin g benzathine   Review of Systems Review of Systems  Constitutional: Negative for fever.  HENT: Negative for sore throat.   Eyes: Negative for redness.  Respiratory: Negative for cough and shortness of breath.   Cardiovascular: Negative for chest pain.  Gastrointestinal: Negative for abdominal pain, diarrhea and vomiting.  Endocrine: Negative for polyuria.  Genitourinary: Positive for flank pain. Negative for dysuria.  Musculoskeletal: Positive for back pain. Negative for neck pain.       Right flank/back, not radicular.   Skin: Negative for rash.  Neurological: Negative for weakness, numbness and headaches.  Hematological: Does not bruise/bleed easily.  Psychiatric/Behavioral: Negative for agitation.     Physical Exam Updated Vital Signs BP (!) 171/128 (BP Location: Right Arm)   Pulse 64   Temp 97.6 F (36.4 C) (Oral)   Resp 18   SpO2 96%   Physical Exam Vitals signs and nursing note reviewed.  Constitutional:      Appearance: Normal appearance. He is well-developed.  HENT:     Head: Atraumatic.     Nose: Nose normal.     Mouth/Throat:     Mouth: Mucous membranes are moist.     Pharynx: Oropharynx is clear.  Eyes:     General: No scleral icterus.    Conjunctiva/sclera: Conjunctivae normal.  Neck:     Musculoskeletal: Normal range of motion and neck supple. No neck rigidity.     Trachea: No tracheal deviation.  Cardiovascular:     Rate and Rhythm: Normal rate and regular rhythm.     Pulses: Normal pulses.     Heart sounds: Normal heart sounds. No murmur. No friction rub. No gallop.   Pulmonary:     Effort: Pulmonary effort is normal. No accessory muscle usage or respiratory distress.     Breath sounds: Normal breath sounds.  Abdominal:     General: Bowel sounds are  normal. There is no distension.     Palpations: Abdomen is soft. There is no mass.     Tenderness: There is no abdominal tenderness. There is no guarding or rebound.     Hernia: No hernia is present.  Genitourinary:    Comments: No cva tenderness. Rectal, medium brown stool, no gross blood or melena.  Musculoskeletal:        General: No swelling or tenderness.     Comments: T/L/S spine non tender, aligned, no step off. No back sts, no skin changes, erythema or lesions noted.   Skin:    General: Skin is warm and dry.  Findings: No rash.  Neurological:     Mental Status: He is alert.     Comments: Alert, speech clear. +HOH. Motor intact bil, sens grossly intact bil.   Psychiatric:        Mood and Affect: Mood normal.      ED Treatments / Results  Labs (all labs ordered are listed, but only abnormal results are displayed) Results for orders placed or performed during the hospital encounter of 12/17/18  Comprehensive metabolic panel  Result Value Ref Range   Sodium 138 135 - 145 mmol/L   Potassium 3.7 3.5 - 5.1 mmol/L   Chloride 102 98 - 111 mmol/L   CO2 26 22 - 32 mmol/L   Glucose, Bld 114 (H) 70 - 99 mg/dL   BUN 16 8 - 23 mg/dL   Creatinine, Ser 1.28 (H) 0.61 - 1.24 mg/dL   Calcium 9.1 8.9 - 10.3 mg/dL   Total Protein 7.3 6.5 - 8.1 g/dL   Albumin 3.7 3.5 - 5.0 g/dL   AST 29 15 - 41 U/L   ALT 19 0 - 44 U/L   Alkaline Phosphatase 90 38 - 126 U/L   Total Bilirubin 1.1 0.3 - 1.2 mg/dL   GFR calc non Af Amer 49 (L) >60 mL/min   GFR calc Af Amer 56 (L) >60 mL/min   Anion gap 10 5 - 15  CBC  Result Value Ref Range   WBC 6.2 4.0 - 10.5 K/uL   RBC 4.21 (L) 4.22 - 5.81 MIL/uL   Hemoglobin 12.3 (L) 13.0 - 17.0 g/dL   HCT 37.8 (L) 39.0 - 52.0 %   MCV 89.8 80.0 - 100.0 fL   MCH 29.2 26.0 - 34.0 pg   MCHC 32.5 30.0 - 36.0 g/dL   RDW 14.4 11.5 - 15.5 %   Platelets 135 (L) 150 - 400 K/uL   nRBC 0.0 0.0 - 0.2 %  Urinalysis, Routine w reflex microscopic  Result Value Ref Range    Color, Urine YELLOW YELLOW   APPearance CLEAR CLEAR   Specific Gravity, Urine 1.015 1.005 - 1.030   pH 5.0 5.0 - 8.0   Glucose, UA NEGATIVE NEGATIVE mg/dL   Hgb urine dipstick MODERATE (A) NEGATIVE   Bilirubin Urine NEGATIVE NEGATIVE   Ketones, ur NEGATIVE NEGATIVE mg/dL   Protein, ur NEGATIVE NEGATIVE mg/dL   Nitrite NEGATIVE NEGATIVE   Leukocytes,Ua NEGATIVE NEGATIVE   RBC / HPF 11-20 0 - 5 RBC/hpf   WBC, UA 0-5 0 - 5 WBC/hpf   Bacteria, UA NONE SEEN NONE SEEN   Mucus PRESENT    Hyaline Casts, UA PRESENT   POC occult blood, ED  Result Value Ref Range   Fecal Occult Bld NEGATIVE NEGATIVE  Type and screen [place]  Result Value Ref Range   ABO/RH(D) O NEG    Antibody Screen NEG    Sample Expiration      12/20/2018,2359 Performed at [facility], [address], [place], [place] [address]    EKG EKG Interpretation  Date/Time:  Saturday December 17 2018 08:19:20 EDT Ventricular Rate:  77 PR Interval:  182 QRS Duration: 174 QT Interval:  454 QTC Calculation: 513 R Axis:   -91 Text Interpretation:  Atrial-sensed ventricular-paced rhythm Confirmed by Lajean Saver [id number]) on 12/17/2018 11:10:46 AM   Radiology Ct Renal Stone Study  Result Date: 12/17/2018 CLINICAL DATA:  Flank pain, stone disease suspected. Lower abdominal and back pain for 1 day. EXAM: CT ABDOMEN AND PELVIS  WITHOUT CONTRAST TECHNIQUE: Multidetector CT imaging of the abdomen and pelvis was performed following the standard protocol without IV contrast. COMPARISON:  CT abdomen dated 06/22/2016. FINDINGS: Lower chest: Hiatal hernia, moderate in size. Hepatobiliary: Multiple gallstones but no evidence of acute cholecystitis. No focal liver abnormality. No bile duct dilatation is seen. Pancreas: Partially infiltrated with fat but otherwise unremarkable. Spleen: Normal in size without focal abnormality. Adrenals/Urinary Tract: Adrenal glands appear normal. Kidneys are unremarkable without  mass, stone or hydronephrosis. No ureteral or bladder calculi identified. Multiple bladder diverticula. Stomach/Bowel: No dilated large or small bowel loops. Extensive diverticulosis of the sigmoid colon, and additional scattered diverticulosis throughout the colon, but no focal inflammatory change is seen to suggest acute diverticulitis at this time. Appendix is normal. No evidence of bowel wall inflammation. Vascular/Lymphatic: Interval aorta bi-iliac stent placement, traversing the lower abdominal aortic aneurysm sac. Aneurysm sac is stable in size at 6.7 cm diameter. No periaortic hemorrhage or inflammation. Extensive aortic atherosclerosis. No enlarged lymph nodes seen. Reproductive: Prostate is unremarkable. Other: No free fluid or abscess collection. No free intraperitoneal air. Musculoskeletal: Old healed fractures of the bilateral inferior pubic rami. Degenerative spondylosis of the slightly scoliotic thoracolumbar spine, mild to moderate in degree. No acute appearing osseous abnormality. IMPRESSION: 1. No acute findings within the abdomen or pelvis. No renal or ureteral calculi. No bowel obstruction or evidence of bowel wall inflammation. No evidence of acute solid organ abnormality. No free fluid or inflammatory change. 2. Interval aorta bi-iliac stent placement, traversing the lower abdominal aortic aneurysm sac. No evidence of surgical complicating feature. Aneurysm sac is stable in size at 6.7 cm diameter. No periaortic hemorrhage or inflammation. 3. Cholelithiasis without evidence of acute cholecystitis. 4. Colonic diverticulosis without evidence of acute diverticulitis at this time. 5. Hiatal hernia, moderate in size. 6. Additional chronic/incidental findings detailed above. Electronically Signed   By: Franki Cabot M.D.   On: 12/17/2018 12:33    Procedures Procedures (including critical care time)  Medications Ordered in ED Medications - No data to display   Initial Impression / Assessment  and Plan / ED Course  I have reviewed the triage vital signs and the nursing notes.  Pertinent labs & imaging results that were available during my care of the patient were reviewed by me and considered in my medical decision making (see chart for details).  Labs sent.  Acetaminophen po.  Reviewed nursing notes and prior charts for additional history.   Labs reviewed by me - hgb c/w baseline, lytes normal.   Acetaminophen po. Po fluids.   CT reviewed by me - no acute process. Incidental note of gallstones. degen changes in lower back.   Recheck abd soft nt. Pt afebrile. Comfortable. No distress.   Patient currently appears stable for d/c.   rec pcp f/u.  Return precautions provided.     Final Clinical Impressions(s) / ED Diagnoses   Final diagnoses:  None    ED Discharge Orders    None       Lajean Saver, MD 12/17/18 1317

## 2018-12-17 NOTE — Discharge Instructions (Addendum)
It was our pleasure to provide your ER care today - we hope that you feel better.  Take acetaminophen as need.   Your ct scan was read as showing no acute process. Note was made of gallstones, as well as colon diverticulosis, but no acute infection is noted.   Follow up with primary care doctor.  Return to ER if worse, new symptoms, fevers, severe or intractable pain, vomiting, or other concern.

## 2018-12-17 NOTE — ED Notes (Signed)
Patient Alert and oriented to baseline. Stable and ambulatory to baseline. Patient verbalized understanding of the discharge instructions.  Patient belongings were taken by the patient.   

## 2018-12-17 NOTE — ED Triage Notes (Signed)
Patient complains of lower abdominal and back pain x 1 day. States that he noticed stool dark in color with same. Patient alert and oriented, extremely [facility]

## 2018-12-18 LAB — URINE CULTURE: Culture: <10000 — AB

## 2018-12-19 ENCOUNTER — Telehealth: Payer: Self-pay | Admitting: Cardiology

## 2018-12-19 NOTE — Telephone Encounter (Signed)
New Message   Pt c/o BP issue:  1. What are your last 5 BP readings? 100/42 2. Are you having any other symptoms (ex. Dizziness, headache, blurred vision, passed out)? Dizziness, passed out  3. What is your medication issue? No   Patients daughter is calling on his behalf. She states that the patient BP has been running low and on yesterday he passed out.

## 2018-12-19 NOTE — Telephone Encounter (Signed)
I spoke to the patient's daughter and she is calling because about a month ago the patient had fallen and was disoriented.  He was diagnosed with UTI.  He fell again yesterday and was very dizzy.  His BP has been from [id number] diastolic.    He has stayed well hydrated with water and gatorade drink, but is still showing low BP.  I scheduled on OV for 9/23 with Vin to further evaluate.

## 2018-12-21 ENCOUNTER — Ambulatory Visit (INDEPENDENT_AMBULATORY_CARE_PROVIDER_SITE_OTHER): Payer: Medicare Other | Admitting: Physician Assistant

## 2018-12-21 ENCOUNTER — Other Ambulatory Visit: Payer: Self-pay

## 2018-12-21 ENCOUNTER — Telehealth: Payer: Self-pay

## 2018-12-21 ENCOUNTER — Encounter: Payer: Self-pay | Admitting: Physician Assistant

## 2018-12-21 VITALS — BP 124/64 | HR 68 | Ht 70.0 in | Wt 155.0 lb

## 2018-12-21 DIAGNOSIS — Z952 Presence of prosthetic heart valve: Secondary | ICD-10-CM | POA: Diagnosis not present

## 2018-12-21 DIAGNOSIS — I2510 Atherosclerotic heart disease of native coronary artery without angina pectoris: Secondary | ICD-10-CM | POA: Diagnosis not present

## 2018-12-21 DIAGNOSIS — I713 Abdominal aortic aneurysm, ruptured, unspecified: Secondary | ICD-10-CM

## 2018-12-21 DIAGNOSIS — I2583 Coronary atherosclerosis due to lipid rich plaque: Secondary | ICD-10-CM

## 2018-12-21 DIAGNOSIS — I951 Orthostatic hypotension: Secondary | ICD-10-CM

## 2018-12-21 DIAGNOSIS — I442 Atrioventricular block, complete: Secondary | ICD-10-CM

## 2018-12-21 NOTE — Telephone Encounter (Signed)

## 2018-12-21 NOTE — Progress Notes (Signed)
Cardiology Office Note    Date:  12/21/2018   ID:  Jay [name], DOB 04-27-1925, MRN [id number]  PCP:  Jerline Pain, MD  Cardiologist:  Dr. Marlou Porch  Electrophysiologist: Dr. Lovena Le  Chief Complaint: Low BP and dizziness   History of Present Illness:   Jay [name] is a 85 y.o. 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   He has an aortic valve replacement #23 pericardial Edwards valve placed on 10/31/2009.  Abdominal aortic aneurysm was treated at [facility].  Has 30% LAD disease, left circumflex with mild aneurysmal dilatation but no stenosis.  Right coronary was small and nondominant. Prior hx of orthostatic hypotension. Last seen by Dr. Marlou Porch 11/15/2018.  Seen in ER 12/17/2018 for acute right flank pain. Renal CT without acute findings. Stable abdominal aortic aneurysm sac at 6.7cm.   His blood pressure running low and had pre syncope on Sunday. Added to my schedule.  On Sunday he walk from his bedroom to living room and suddenly " passed out".  However, per daughter he never lost consciousness.  He felt extremely fatigued and somewhat disoriented.  Family was outside.  His systolic blood pressure runs in 90-170s.  He has chronic stable dyspnea on exertion.  No orthopnea, PND, lower extremity edema or melena.  Has complaints of intermittent left upper sharp pain for a few seconds.  Patient had a similar episode about a month ago when he was diagnosed with UTI.  Past Medical History:  Diagnosis Date  . Allergic rhinitis    ,RAD wheezing  . Cataracts, bilateral   . Diverticulosis    , ACBE, flex, in 2003  . Dyslipidemia   . Fracture of left clavicle    , Hx  . Hearing loss    , Bilateral  . Hypothyroidism   . Insomnia   . Kidney stone   . OA (optic atrophy)   . Osteoarthritis   . Pelvic fracture (HCC)    , Hx  . RBBB (right  bundle branch block)    , seen on EKG  . Varicose veins     Past Surgical History:  Procedure Laterality Date  . ABDOMINAL AORTOGRAM N/A 06/22/2016   Procedure: Abdominal Aortogram;  Surgeon: Katha Cabal, MD;  Location: [place] [facility];  Service: Cardiovascular;  Laterality: N/A;  . AORTIC INTERVENTION N/A 06/22/2016   Procedure: Aortic Intervention;  Surgeon: Katha Cabal, MD;  Location: [place] [facility];  Service: Cardiovascular;  Laterality: N/A;  . AORTIC VALVE REPLACEMENT  2011  . CATARACT EXTRACTION W/PHACO Right 02/17/2016   Procedure: CATARACT EXTRACTION PHACO AND INTRAOCULAR LENS PLACEMENT (IOC);  Surgeon: Ronnell Freshwater, MD;  Location: [place];  Service: Ophthalmology;  Laterality: Right;  RIGHT  . INGUINAL HERNIA REPAIR Bilateral   . Orchectomy Right   . PACEMAKER IMPLANT N/A 05/31/2017   Procedure: PACEMAKER IMPLANT;  Surgeon: Evans Lance, MD;  Location: [place] [facility];  Service: Cardiovascular;  Laterality: N/A;  . TEMPORARY PACEMAKER N/A 05/31/2017   Procedure: TEMPORARY PACEMAKER;  Surgeon: Sherren Mocha, MD;  Location: [place] [facility];  Service: Cardiovascular;  Laterality: N/A;  . THYROIDECTOMY      Current Medications: Prior to Admission medications   Medication Sig Start Date End Date Taking? Authorizing Provider  albuterol (PROVENTIL HFA;VENTOLIN HFA) 108 (90 BASE) MCG/ACT inhaler  Inhale 2 puffs into the lungs every 6 (six) hours as needed for wheezing or shortness of breath.    [provider]  aspirin EC 81 MG tablet Take 1 tablet (81 mg total) by mouth daily. 12/28/16   Jerline Pain, MD  atorvastatin (LIPITOR) 10 MG tablet Take 10 mg by mouth daily.    [provider]  levothyroxine (SYNTHROID, LEVOTHROID) 88 MCG tablet Take 88 mcg by mouth daily before breakfast.    [provider]  saw palmetto 500 MG capsule Take 500 mg by mouth daily.    [provider]    Allergies:    Aleve [naproxen sodium], Codeine, and Penicillin g benzathine   Social History   Socioeconomic History  . Marital status: Married    Spouse name: Not on file  . Number of children: Not on file  . Years of education: Not on file  . Highest education level: Not on file  Occupational History  . Not on file  Social Needs  . Financial resource strain: Not on file  . Food insecurity    Worry: Not on file    Inability: Not on file  . Transportation needs    Medical: Not on file    Non-medical: Not on file  Tobacco Use  . Smoking status: Former Smoker    Quit date: 03/30/1965    Years since quitting: 53.7  . Smokeless tobacco: Current User    Types: Chew  Substance and Sexual Activity  . Alcohol use: No    Comment: Rarely  . Drug use: No  . Sexual activity: Not on file  Lifestyle  . Physical activity    Days per week: Not on file    Minutes per session: Not on file  . Stress: Not on file  Relationships  . Social Herbalist on phone: Not on file    Gets together: Not on file    Attends religious service: Not on file    Active member of club or organization: Not on file    Attends meetings of clubs or organizations: Not on file    Relationship status: Not on file  Other Topics Concern  . Not on file  Social History Narrative  . Not on file     Family History:  The patient's family history includes Diabetes Mellitus II in his mother.   ROS:   Please see the history of present illness.    ROS All other systems reviewed and are negative.   PHYSICAL EXAM:   VS:  BP 124/64   Pulse 68   Ht 5\' 10"  (1.778 m)   Wt 155 lb (70.3 kg)   SpO2 98%   BMI 22.24 kg/m    GEN: Well nourished, well developed, in no acute distress  HEENT: normal  Neck: no JVD, carotid bruits, or masses Cardiac: RRR; no murmurs, rubs, or gallops,no edema  Respiratory:  clear to auscultation bilaterally, normal work of breathing GI: soft, nontender, nondistended, + BS MS: no deformity or  atrophy  Skin: warm and dry, no rash Neuro:  Alert and Oriented x 3, Strength and sensation are intact Psych: euthymic mood, full affect  Wt Readings from Last 3 Encounters:  12/21/18 155 lb (70.3 kg)  11/21/18 157 lb (71.2 kg)  11/15/18 155 lb 3.2 oz (70.4 kg)      Studies/Labs Reviewed:   EKG:  EKG is not  ordered today.    Recent Labs: 12/17/2018: ALT 19; BUN 16;  Creatinine, Ser 1.28; Hemoglobin 12.3; Platelets 135; Potassium 3.7; Sodium 138   Lipid Panel    Component Value Date/Time   CHOL 135 03/30/2016 0652   TRIG 68 03/30/2016 0652   HDL 38 (L) 03/30/2016 0652   CHOLHDL 3.6 03/30/2016 0652   VLDL 14 03/30/2016 0652   LDLCALC 83 03/30/2016 0652    Additional studies/ records that were reviewed today include:   Echocardiogram: 05/2017 Study Conclusions  - Left ventricle: The cavity size was mildly dilated. There was   mild concentric hypertrophy. Systolic function was normal. The   estimated ejection fraction was in the range of 60% to 65%. Wall   motion was normal; there were no regional wall motion   abnormalities. The study is not technically sufficient to allow   evaluation of LV diastolic function due to underlying second   degree AV block. - Aortic valve: Poorly visualized. A 2.3 cmpericardial   bioprosthesis was present and functioning normally. Mean gradient   (S): 10 mm Hg. Valve area (VTI): 1.33 cm^2. Valve area (Vmax):   1.29 cm^2. Valve area (Vmean): 1.4 cm^2. - Left atrium: The atrium was mildly dilated. - Pulmonary arteries: PA peak pressure: 64 mm Hg (S).  Impressions:  - The right ventricular systolic pressure was increased consistent   with moderate pulmonary hypertension.  ASSESSMENT & PLAN:    1.  Orthostatic hypotension/presyncope -His symptoms consistent with orthostasis in nature.  No LOC.  I have reviewed with Dr. Marlou Porch.  Continue conservative approach with increased fluid intake (especially in the morning as blood pressure runs  low), salt stabilization and compression stocking.  Advised to get balance before walking. Orthostatic VS for the past 24 hrs:  BP- Lying Pulse- Lying BP- Sitting Pulse- Sitting BP- Standing at 0 minutes Pulse- Standing at 0 minutes  12/21/18 0806 144/73 64 124/69 68 92/58 70   2. AAA s/p endovascular repair - Stable. Followed by vascular  3. S/p AVR - Normal function valve by last echo 05/2017  Medication Adjustments/Labs and Tests Ordered: Current medicines are reviewed at length with the patient today.  Concerns regarding medicines are outlined above.  Medication changes, Labs and Tests ordered today are listed in the Patient Instructions below. Patient Instructions  Medication Instructions:   Your physician recommends that you continue on your current medications as directed. Please refer to the Current Medication list given to you today.  If you need a refill on your cardiac medications before your next appointment, please call your pharmacy.   Lab work:  None ordered today  Testing/Procedures:  None ordered today  Follow-Up:  On 02/01/19 at [place] with [facility], this will be a virtual visit.  Any Other Special Instructions Will Be Listed Below (If Applicable).  Increase your water intake and wear compression socks.      Jarrett Soho, [place]  12/21/2018 9:46 AM    [facility] [place], [place],   [address] Phone: [phone]; Fax: [phone]

## 2018-12-21 NOTE — Patient Instructions (Signed)
Medication Instructions:   Your physician recommends that you continue on your current medications as directed. Please refer to the Current Medication list given to you today.  If you need a refill on your cardiac medications before your next appointment, please call your pharmacy.   Lab work:  None ordered today  Testing/Procedures:  None ordered today  Follow-Up:  On 02/01/19 at [place] with [facility], this will be a virtual visit.  Any Other Special Instructions Will Be Listed Below (If Applicable).  Increase your water intake and wear compression socks.

## 2019-01-07 DIAGNOSIS — Z23 Encounter for immunization: Secondary | ICD-10-CM | POA: Diagnosis not present

## 2019-01-31 NOTE — Progress Notes (Signed)
Virtual Visit via Video Note   This visit type was conducted due to national recommendations for restrictions regarding the COVID-19 Pandemic (e.g. social distancing) in an effort to limit this patient's exposure and mitigate transmission in our community.  Due to his co-morbid illnesses, this patient is at least at moderate risk for complications without adequate follow up.  This format is felt to be most appropriate for this patient at this time.  All issues noted in this document were discussed and addressed.  A limited physical exam was performed with this format.  Please refer to the patient's chart for his consent to telehealth for [facility].   Date:  02/01/2019   ID:  [name], DOB 12/16/1925, MRN [id number]  Patient Location: Home Provider Location: Home  PCP:  Jerline Pain, MD  Cardiologist:  Dr. Marlou Porch  Electrophysiologist: Dr. Lovena Le  Evaluation Performed:  Follow-Up Visit  Chief Complaint:  6 weeks follow up  History of Present Illness:    Jay [name] is a 85 y.o. 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  He has an aortic valve replacement #23 pericardial Edwards valve placed on 10/31/2009. Abdominal aortic aneurysm was treated at [facility]. Has 30% LAD disease, left circumflex with mild aneurysmal dilatation but no stenosis. Right coronary was small and nondominant. Prior hx of orthostatic hypotension. Last seen by Dr. Marlou Porch 11/15/2018.  Seen in ER 12/17/2018 for acute right flank pain. Renal CT without acute findings. Stable abdominal aortic aneurysm sac at 6.7cm.   Seen by me 12/21/2018 for pre syncope and low blood pressure. No LOC.  He was orthostatic in clinic by vitals. Dr. Marlou Porch recommended conservative approach with increased fluid intake (especially in the morning as blood pressure runs low),  salt stabilization and compression stocking.  Advised to get balance before walking.  Seen for follow up. Daughter helped for video visit. No falls since last office visit getting balance before movement. SBP fluctuates 100-150s. No chest pain, palpitations, LE edema, melena or syncope.   The patient does not have symptoms concerning for COVID-19 infection (fever, chills, cough, or new shortness of breath).    Past Medical History:  Diagnosis Date  . Allergic rhinitis    ,RAD wheezing  . Cataracts, bilateral   . Diverticulosis    , ACBE, flex, in 2003  . Dyslipidemia   . Fracture of left clavicle    , Hx  . Hearing loss    , Bilateral  . Hypothyroidism   . Insomnia   . Kidney stone   . OA (optic atrophy)   . Osteoarthritis   . Pelvic fracture (HCC)    , Hx  . RBBB (right bundle branch block)    , seen on EKG  . Varicose veins    Past Surgical History:  Procedure Laterality Date  . ABDOMINAL AORTOGRAM N/A 06/22/2016   Procedure: Abdominal Aortogram;  Surgeon: Katha Cabal, MD;  Location: [place] [facility];  Service: Cardiovascular;  Laterality: N/A;  . AORTIC INTERVENTION N/A 06/22/2016   Procedure: Aortic Intervention;  Surgeon: Katha Cabal, MD;  Location: [place] [facility];  Service: Cardiovascular;  Laterality: N/A;  . AORTIC VALVE REPLACEMENT  2011  . CATARACT EXTRACTION W/PHACO Right 02/17/2016   Procedure: CATARACT EXTRACTION PHACO AND INTRAOCULAR LENS PLACEMENT (IOC);  Surgeon: Ronnell Freshwater, MD;  Location: [place];  Service: Ophthalmology;  Laterality: Right;  RIGHT  . INGUINAL HERNIA REPAIR Bilateral   . Orchectomy Right   . PACEMAKER IMPLANT N/A 05/31/2017   Procedure: PACEMAKER IMPLANT;  Surgeon: Evans Lance, MD;  Location: [place] [facility];  Service: Cardiovascular;  Laterality: N/A;  . TEMPORARY PACEMAKER N/A 05/31/2017   Procedure: TEMPORARY PACEMAKER;  Surgeon: Sherren Mocha, MD;  Location: [place] [facility];   Service: Cardiovascular;  Laterality: N/A;  . THYROIDECTOMY       Current Meds  Medication Sig  . albuterol (PROVENTIL HFA;VENTOLIN HFA) 108 (90 BASE) MCG/ACT inhaler Inhale 2 puffs into the lungs every 6 (six) hours as needed for wheezing or shortness of breath.  [name] aspirin EC 81 MG tablet Take 1 tablet (81 mg total) by mouth daily.  [name] atorvastatin (LIPITOR) 10 MG tablet Take 10 mg by mouth daily.  [name] levothyroxine (SYNTHROID, LEVOTHROID) 88 MCG tablet Take 88 mcg by mouth daily before breakfast.  . saw palmetto 500 MG capsule Take 500 mg by mouth daily.     Allergies:   Aleve [naproxen sodium], Codeine, and Penicillin g benzathine   Social History   Tobacco Use  . Smoking status: Former Smoker    Quit date: 03/30/1965    Years since quitting: 53.8  . Smokeless tobacco: Current User    Types: Chew  Substance Use Topics  . Alcohol use: No    Comment: Rarely  . Drug use: No     Family Hx: The patient's family history includes Diabetes Mellitus II in his mother.  ROS:   Please see the history of present illness.   All other systems reviewed and are negative.   Prior CV studies:   The following studies were reviewed today: As above  Labs/Other Tests and Data Reviewed:    EKG:  No ECG reviewed.  Recent Labs: 12/17/2018: ALT 19; BUN 16; Creatinine, Ser 1.28; Hemoglobin 12.3; Platelets 135; Potassium 3.7; Sodium 138   Recent Lipid Panel Lab Results  Component Value Date/Time   CHOL 135 03/30/2016 06:52 AM   TRIG 68 03/30/2016 06:52 AM   HDL 38 (L) 03/30/2016 06:52 AM   CHOLHDL 3.6 03/30/2016 06:52 AM   LDLCALC 83 03/30/2016 06:52 AM    Wt Readings from Last 3 Encounters:  02/01/19 155 lb (70.3 kg)  12/21/18 155 lb (70.3 kg)  11/21/18 157 lb (71.2 kg)     Objective:    Vital Signs:  BP 124/76   Ht 5\' 10"  (1.778 m)   Wt 155 lb (70.3 kg)   BMI 22.24 kg/m    VITAL SIGNS:  reviewed GEN:  no acute distress EYES:  sclerae anicteric, EOMI - Extraocular Movements  Intact RESPIRATORY:  normal respiratory effort, symmetric expansion CARDIOVASCULAR:  no peripheral edema SKIN:  no rash, lesions or ulcers. MUSCULOSKELETAL:  no obvious deformities. NEURO:  alert and oriented x 3, no obvious focal deficit PSYCH:  normal affect  ASSESSMENT & PLAN:    1. Orthostatic hypotension - Continue conservative approach with increased fluid intake (especially in the morning as blood pressure runs low), salt stabilization and compression stocking.  Advised to get balance before walking.  2. AAA s/p endovascular repair - Stable. Followed by vascular  3. S/p AVR - Normal function valve by last echo 05/2017  COVID-19 Education: The signs and symptoms of COVID-19 were discussed with the patient and how to seek care for testing (follow up with PCP or arrange E-visit). The importance of  social distancing was discussed today.  Time:   Today, I have spent 10  minutes with the patient with telehealth technology discussing the above problems.     Medication Adjustments/Labs and Tests Ordered: Current medicines are reviewed at length with the patient today.  Concerns regarding medicines are outlined above.   Tests Ordered: No orders of the defined types were placed in this encounter.   Medication Changes: No orders of the defined types were placed in this encounter.   Follow Up:  Either In Person or Virtual in 6 month(s)  Signed, Leanor Kail, [place]  02/01/2019 11:18 AM    [place]

## 2019-02-01 ENCOUNTER — Encounter: Payer: Self-pay | Admitting: Physician Assistant

## 2019-02-01 ENCOUNTER — Other Ambulatory Visit: Payer: Self-pay

## 2019-02-01 ENCOUNTER — Telehealth: Payer: Medicare Other | Admitting: Physician Assistant

## 2019-02-01 ENCOUNTER — Telehealth (INDEPENDENT_AMBULATORY_CARE_PROVIDER_SITE_OTHER): Payer: Medicare Other | Admitting: Physician Assistant

## 2019-02-01 VITALS — BP 124/76 | Ht 70.0 in | Wt 155.0 lb

## 2019-02-01 DIAGNOSIS — I951 Orthostatic hypotension: Secondary | ICD-10-CM | POA: Diagnosis not present

## 2019-02-01 DIAGNOSIS — Z952 Presence of prosthetic heart valve: Secondary | ICD-10-CM

## 2019-02-01 DIAGNOSIS — I713 Abdominal aortic aneurysm, ruptured, unspecified: Secondary | ICD-10-CM

## 2019-02-01 NOTE — Patient Instructions (Signed)
Medication Instructions:   Your physician recommends that you continue on your current medications as directed. Please refer to the Current Medication list given to you today.  *If you need a refill on your cardiac medications before your next appointment, please call your pharmacy*  Lab Work:  None ordered today  Testing/Procedures:  None ordered today  Follow-Up: At [facility], you and your health needs are our priority.  As part of our continuing mission to provide you with exceptional heart care, we have created designated Provider Care Teams.  These Care Teams include your primary Cardiologist (physician) and Advanced Practice Providers (APPs -  Physician Assistants and Nurse Practitioners) who all work together to provide you with the care you need, when you need it.  Your next appointment:   6 months  The format for your next appointment:   Either In Person or Virtual  Provider:   You may see Candee Furbish, MD or one of the following Advanced Practice Providers on your designated Care Team:    Truitt Merle, NP  Cecilie Kicks, NP  Kathyrn Drown, NP

## 2019-02-28 ENCOUNTER — Ambulatory Visit (INDEPENDENT_AMBULATORY_CARE_PROVIDER_SITE_OTHER): Payer: Medicare Other | Admitting: *Deleted

## 2019-02-28 DIAGNOSIS — I442 Atrioventricular block, complete: Secondary | ICD-10-CM | POA: Diagnosis not present

## 2019-02-28 LAB — CUP PACEART REMOTE DEVICE CHECK
Battery Remaining Longevity: 108 mo
Battery Remaining Percentage: 95.5 %
Battery Voltage: 3.01 V
Brady Statistic AP VP Percent: 62 %
Brady Statistic AP VS Percent: 1 %
Brady Statistic AS VP Percent: 38 %
Brady Statistic AS VS Percent: 1 %
Brady Statistic RA Percent Paced: 61 %
Brady Statistic RV Percent Paced: 99 %
Date Time Interrogation Session: 20201201020013
Implantable Lead Implant Date: 20190304
Implantable Lead Implant Date: 20190304
Implantable Lead Location: 753859
Implantable Lead Location: 753860
Implantable Pulse Generator Implant Date: 20190304
Lead Channel Impedance Value: 400 Ohm
Lead Channel Impedance Value: 680 Ohm
Lead Channel Pacing Threshold Amplitude: 0.5 V
Lead Channel Pacing Threshold Amplitude: 0.75 V
Lead Channel Pacing Threshold Pulse Width: 0.5 ms
Lead Channel Pacing Threshold Pulse Width: 0.5 ms
Lead Channel Sensing Intrinsic Amplitude: 12 mV
Lead Channel Sensing Intrinsic Amplitude: 3.2 mV
Lead Channel Setting Pacing Amplitude: 2 V
Lead Channel Setting Pacing Amplitude: 2.5 V
Lead Channel Setting Pacing Pulse Width: 0.5 ms
Lead Channel Setting Sensing Sensitivity: 4 mV
Pulse Gen Model: 2272
Pulse Gen Serial Number: 8992729

## 2019-03-25 NOTE — Progress Notes (Signed)
PPM remote 

## 2019-04-06 ENCOUNTER — Other Ambulatory Visit: Payer: Self-pay

## 2019-04-06 ENCOUNTER — Ambulatory Visit (INDEPENDENT_AMBULATORY_CARE_PROVIDER_SITE_OTHER): Payer: Medicare Other | Admitting: Nurse Practitioner

## 2019-04-06 ENCOUNTER — Ambulatory Visit (INDEPENDENT_AMBULATORY_CARE_PROVIDER_SITE_OTHER): Payer: Medicare Other

## 2019-04-06 ENCOUNTER — Encounter (INDEPENDENT_AMBULATORY_CARE_PROVIDER_SITE_OTHER): Payer: Self-pay | Admitting: Nurse Practitioner

## 2019-04-06 ENCOUNTER — Encounter (INDEPENDENT_AMBULATORY_CARE_PROVIDER_SITE_OTHER): Payer: Self-pay

## 2019-04-06 VITALS — BP 114/66 | HR 61 | Resp 16 | Wt 155.0 lb

## 2019-04-06 DIAGNOSIS — I10 Essential (primary) hypertension: Secondary | ICD-10-CM | POA: Diagnosis not present

## 2019-04-06 DIAGNOSIS — I713 Abdominal aortic aneurysm, ruptured, unspecified: Secondary | ICD-10-CM

## 2019-04-06 DIAGNOSIS — E785 Hyperlipidemia, unspecified: Secondary | ICD-10-CM | POA: Diagnosis not present

## 2019-04-10 ENCOUNTER — Encounter (INDEPENDENT_AMBULATORY_CARE_PROVIDER_SITE_OTHER): Payer: Self-pay | Admitting: Nurse Practitioner

## 2019-04-10 NOTE — Progress Notes (Signed)
SUBJECTIVE:  Patient ID: Jay [name], male    DOB: [birth year], 86 y.o.   MRN: [id number] Chief Complaint  Patient presents with  . Follow-up    ultrasound follow up    HPI  Jay [name] is a 86 y.o. male The patient returns to the office for surveillance of a known abdominal aortic aneurysm. Patient denies abdominal pain or back pain, no other abdominal complaints. No changes suggesting embolic episodes.   There have been no interval changes in the patient's overall health care since his last visit.  Patient denies amaurosis fugax or TIA symptoms. There is no history of claudication or rest pain symptoms of the lower extremities. The patient denies angina or shortness of breath.   Duplex US of the aorta and iliac arteries shows an AAA measured 5.88 cm with 6.11 iliac artery aneurysms.CT scan on 12/09/2018 showed 6.7 cm maximal diameter.   Past Medical History:  Diagnosis Date  . Allergic rhinitis    ,RAD wheezing  . Cataracts, bilateral   . Diverticulosis    , ACBE, flex, in 2003  . Dyslipidemia   . Fracture of left clavicle    , Hx  . Hearing loss    , Bilateral  . Hypothyroidism   . Insomnia   . Kidney stone   . OA (optic atrophy)   . Osteoarthritis   . Pelvic fracture (HCC)    , Hx  . RBBB (right bundle branch block)    , seen on EKG  . Varicose veins     Past Surgical History:  Procedure Laterality Date  . ABDOMINAL AORTOGRAM N/A 06/22/2016   Procedure: Abdominal Aortogram;  Surgeon: Katha Cabal, MD;  Location: [facility];  Service: Cardiovascular;  Laterality: N/A;  . AORTIC INTERVENTION N/A 06/22/2016   Procedure: Aortic Intervention;  Surgeon: Katha Cabal, MD;  Location: [facility];  Service: Cardiovascular;  Laterality: N/A;  . AORTIC VALVE REPLACEMENT  2011  . CATARACT EXTRACTION W/PHACO Right 02/17/2016   Procedure: CATARACT EXTRACTION PHACO AND INTRAOCULAR LENS PLACEMENT (IOC);  Surgeon: Ronnell Freshwater, MD;   Location: [facility];  Service: Ophthalmology;  Laterality: Right;  RIGHT  . INGUINAL HERNIA REPAIR Bilateral   . Orchectomy Right   . PACEMAKER IMPLANT N/A 05/31/2017   Procedure: PACEMAKER IMPLANT;  Surgeon: Evans Lance, MD;  Location: [facility];  Service: Cardiovascular;  Laterality: N/A;  . TEMPORARY PACEMAKER N/A 05/31/2017   Procedure: TEMPORARY PACEMAKER;  Surgeon: Sherren Mocha, MD;  Location: [facility];  Service: Cardiovascular;  Laterality: N/A;  . THYROIDECTOMY      Social History   Socioeconomic History  . Marital status: Married    Spouse name: Not on file  . Number of children: Not on file  . Years of education: Not on file  . Highest education level: Not on file  Occupational History  . Not on file  Tobacco Use  . Smoking status: Former Smoker    Quit date: 03/30/1965    Years since quitting: 54.0  . Smokeless tobacco: Current User    Types: Chew  Substance and Sexual Activity  . Alcohol use: No    Comment: Rarely  . Drug use: No  . Sexual activity: Not on file  Other Topics Concern  . Not on file  Social History Narrative  . Not on file   Social Determinants of Health   Financial Resource Strain:   . Difficulty of Paying  Living Expenses: Not on file  Food Insecurity:   . Worried About Charity fundraiser in the Last Year: Not on file  . Ran Out of Food in the Last Year: Not on file  Transportation Needs:   . Lack of Transportation (Medical): Not on file  . Lack of Transportation (Non-Medical): Not on file  Physical Activity:   . Days of Exercise per Week: Not on file  . Minutes of Exercise per Session: Not on file  Stress:   . Feeling of Stress : Not on file  Social Connections:   . Frequency of Communication with Friends and Family: Not on file  . Frequency of Social Gatherings with Friends and Family: Not on file  . Attends Religious Services: Not on file  . Active Member of Clubs or Organizations: Not on file  .  Attends Archivist Meetings: Not on file  . Marital Status: Not on file  Intimate Partner Violence:   . Fear of Current or Ex-Partner: Not on file  . Emotionally Abused: Not on file  . Physically Abused: Not on file  . Sexually Abused: Not on file    Family History  Problem Relation Age of Onset  . Diabetes Mellitus II Mother     Allergies  Allergen Reactions  . Aleve [Naproxen Sodium] Hives  . Codeine Other (See Comments)    unknown  . Penicillin G Benzathine     Pt doesn't remember reaction     Review of Systems   Review of Systems: Negative Unless Checked Constitutional: [] Weight loss  [] Fever  [] Chills Cardiac: [] Chest pain   []  Atrial Fibrillation  [] Palpitations   [] Shortness of breath when laying flat   [] Shortness of breath with exertion. [] Shortness of breath at rest Vascular:  [] Pain in legs with walking   [] Pain in legs with standing [] Pain in legs when laying flat   [] Claudication    [] Pain in feet when laying flat    [] History of DVT   [] Phlebitis   [] Swelling in legs   [] Varicose veins   [] Non-healing ulcers Pulmonary:   [] Uses home oxygen   [] Productive cough   [] Hemoptysis   [] Wheeze  [] COPD   [] Asthma Neurologic:  [] Dizziness   [] Seizures  [] Blackouts [] History of stroke   [] History of TIA  [] Aphasia   [] Temporary Blindness   [] Weakness or numbness in arm   [] Weakness or numbness in leg Musculoskeletal:   [] Joint swelling   [] Joint pain   [] Low back pain  []  History of Knee Replacement [x] Arthritis [] back Surgeries  []  Spinal Stenosis    Hematologic:  [] Easy bruising  [] Easy bleeding   [] Hypercoagulable state   [] Anemic Gastrointestinal:  [] Diarrhea   [] Vomiting  [] Gastroesophageal reflux/heartburn   [] Difficulty swallowing. [] Abdominal pain Genitourinary:  [] Chronic kidney disease   [] Difficult urination  [] Anuric   [] Blood in urine [] Frequent urination  [] Burning with urination   [] Hematuria Skin:  [] Rashes   [] Ulcers [] Wounds Psychological:   [] History of anxiety   []  History of major depression  []  Memory Difficulties      OBJECTIVE:   Physical Exam  BP 114/66 (BP Location: Right Arm)   Pulse 61   Resp 16   Wt 155 lb (70.3 kg)   BMI 22.24 kg/m   Gen: WD/WN, NAD Head: [place]/AT, No temporalis wasting.  Ear/Nose/Throat: Hearing grossly intact, nares w/o erythema or drainage Eyes: PER, EOMI, sclera nonicteric.  Neck: Supple, no masses.  No JVD.  Pulmonary:  Good air movement, no use  of accessory muscles.  Cardiac: RRR Vascular:  Vessel Right Left  Radial Palpable Palpable   Gastrointestinal: soft, non-distended. No guarding/no peritoneal signs.  Musculoskeletal: M/S 5/5 throughout.  No deformity or atrophy.  Neurologic: Pain and light touch intact in extremities.  Symmetrical.  Speech is fluent. Motor exam as listed above. Psychiatric: Judgment intact, Mood & affect appropriate for pt's clinical situation. Dermatologic: No Venous rashes. No Ulcers Noted.  No changes consistent with cellulitis. Lymph : No Cervical lymphadenopathy, no lichenification or skin changes of chronic lymphedema.       ASSESSMENT AND PLAN:  1. Ruptured abdominal aortic aneurysm (AAA) (HCC) Recommend: Patient is status post successful endovascular repair of the AAA.   No further intervention is required at this time.   No endoleak is detected and the aneurysm sac is stable.  The patient will continue antiplatelet therapy as prescribed as well as aggressive management of hyperlipidemia. Exercise is again strongly encouraged.   However, endografts require continued surveillance with ultrasound or CT scan. This is mandatory to detect any changes that allow repressurization of the aneurysm sac.  The patient is informed that this would be asymptomatic.  The patient is reminded that lifelong routine surveillance is a necessity with an endograft. Patient will continue to follow-up at 6 month intervals with ultrasound of the aorta.  2. Essential  hypertension Continue antihypertensive medications as already ordered, these medications have been reviewed and there are no changes at this time.   3. Dyslipidemia Continue statin as ordered and reviewed, no changes at this time    Current Outpatient Medications on File Prior to Visit  Medication Sig Dispense Refill  . albuterol (PROVENTIL HFA;VENTOLIN HFA) 108 (90 BASE) MCG/ACT inhaler Inhale 2 puffs into the lungs every 6 (six) hours as needed for wheezing or shortness of breath.    [name] aspirin EC 81 MG tablet Take 1 tablet (81 mg total) by mouth daily. 90 tablet 3  . atorvastatin (LIPITOR) 10 MG tablet Take 10 mg by mouth daily.    [name] levothyroxine (SYNTHROID, LEVOTHROID) 88 MCG tablet Take 88 mcg by mouth daily before breakfast.    . saw palmetto 500 MG capsule Take 500 mg by mouth daily.     No current facility-administered medications on file prior to visit.    There are no Patient Instructions on file for this visit. No follow-ups on file.   Kris Hartmann, NP  This note was completed with Sales executive.  Any errors are purely unintentional.

## 2019-05-30 ENCOUNTER — Ambulatory Visit (INDEPENDENT_AMBULATORY_CARE_PROVIDER_SITE_OTHER): Payer: Medicare Other | Admitting: *Deleted

## 2019-05-30 DIAGNOSIS — I442 Atrioventricular block, complete: Secondary | ICD-10-CM

## 2019-05-30 LAB — CUP PACEART REMOTE DEVICE CHECK
Battery Remaining Longevity: 108 mo
Battery Remaining Percentage: 95.5 %
Battery Voltage: 3.01 V
Brady Statistic AP VP Percent: 60 %
Brady Statistic AP VS Percent: 1 %
Brady Statistic AS VP Percent: 40 %
Brady Statistic AS VS Percent: 1 %
Brady Statistic RA Percent Paced: 58 %
Brady Statistic RV Percent Paced: 99 %
Date Time Interrogation Session: 20210302020014
Implantable Lead Implant Date: 20190304
Implantable Lead Implant Date: 20190304
Implantable Lead Location: 753859
Implantable Lead Location: 753860
Implantable Pulse Generator Implant Date: 20190304
Lead Channel Impedance Value: 390 Ohm
Lead Channel Impedance Value: 660 Ohm
Lead Channel Pacing Threshold Amplitude: 0.5 V
Lead Channel Pacing Threshold Amplitude: 0.75 V
Lead Channel Pacing Threshold Pulse Width: 0.5 ms
Lead Channel Pacing Threshold Pulse Width: 0.5 ms
Lead Channel Sensing Intrinsic Amplitude: 12 mV
Lead Channel Sensing Intrinsic Amplitude: 3.3 mV
Lead Channel Setting Pacing Amplitude: 2 V
Lead Channel Setting Pacing Amplitude: 2.5 V
Lead Channel Setting Pacing Pulse Width: 0.5 ms
Lead Channel Setting Sensing Sensitivity: 4 mV
Pulse Gen Model: 2272
Pulse Gen Serial Number: 8992729

## 2019-05-31 NOTE — Progress Notes (Signed)
PPM Remote  

## 2019-06-13 ENCOUNTER — Emergency Department: Payer: Medicare Other

## 2019-06-13 ENCOUNTER — Other Ambulatory Visit: Payer: Self-pay

## 2019-06-13 ENCOUNTER — Encounter: Payer: Self-pay | Admitting: *Deleted

## 2019-06-13 ENCOUNTER — Emergency Department
Admission: EM | Admit: 2019-06-13 | Discharge: 2019-06-13 | Disposition: A | Discharge status: Home / Self Care | Payer: Medicare Other | Attending: Emergency Medicine | Admitting: Emergency Medicine

## 2019-06-13 DIAGNOSIS — Z950 Presence of cardiac pacemaker: Secondary | ICD-10-CM | POA: Diagnosis not present

## 2019-06-13 DIAGNOSIS — Z79899 Other long term (current) drug therapy: Secondary | ICD-10-CM | POA: Insufficient documentation

## 2019-06-13 DIAGNOSIS — F17220 Nicotine dependence, chewing tobacco, uncomplicated: Secondary | ICD-10-CM | POA: Diagnosis not present

## 2019-06-13 DIAGNOSIS — Z7982 Long term (current) use of aspirin: Secondary | ICD-10-CM | POA: Diagnosis not present

## 2019-06-13 DIAGNOSIS — E039 Hypothyroidism, unspecified: Secondary | ICD-10-CM | POA: Diagnosis not present

## 2019-06-13 DIAGNOSIS — I2510 Atherosclerotic heart disease of native coronary artery without angina pectoris: Secondary | ICD-10-CM | POA: Insufficient documentation

## 2019-06-13 DIAGNOSIS — R109 Unspecified abdominal pain: Secondary | ICD-10-CM

## 2019-06-13 DIAGNOSIS — R319 Hematuria, unspecified: Secondary | ICD-10-CM | POA: Insufficient documentation

## 2019-06-13 DIAGNOSIS — I714 Abdominal aortic aneurysm, without rupture: Secondary | ICD-10-CM | POA: Diagnosis not present

## 2019-06-13 DIAGNOSIS — M549 Dorsalgia, unspecified: Secondary | ICD-10-CM | POA: Diagnosis present

## 2019-06-13 DIAGNOSIS — K8020 Calculus of gallbladder without cholecystitis without obstruction: Secondary | ICD-10-CM | POA: Diagnosis not present

## 2019-06-13 DIAGNOSIS — I10 Essential (primary) hypertension: Secondary | ICD-10-CM | POA: Diagnosis not present

## 2019-06-13 LAB — COMPREHENSIVE METABOLIC PANEL
ALT: 18 U/L (ref 0–44)
AST: 29 U/L (ref 15–41)
Albumin: 4.3 g/dL (ref 3.5–5.0)
Alkaline Phosphatase: 91 U/L (ref 38–126)
Anion gap: 10 (ref 5–15)
BUN: 22 mg/dL (ref 8–23)
CO2: 27 mmol/L (ref 22–32)
Calcium: 9.2 mg/dL (ref 8.9–10.3)
Chloride: 98 mmol/L (ref 98–111)
Creatinine, Ser: 1.39 mg/dL — ABNORMAL HIGH (ref 0.61–1.24)
GFR calc Af Amer: 51 mL/min — ABNORMAL LOW (ref >60)
GFR calc non Af Amer: 44 mL/min — ABNORMAL LOW (ref >60)
Glucose, Bld: 111 mg/dL — ABNORMAL HIGH (ref 70–99)
Potassium: 4.9 mmol/L (ref 3.5–5.1)
Sodium: 135 mmol/L (ref 135–145)
Total Bilirubin: 0.9 mg/dL (ref 0.3–1.2)
Total Protein: 8 g/dL (ref 6.5–8.1)

## 2019-06-13 LAB — CBC
HCT: 37.1 % — ABNORMAL LOW (ref 39.0–52.0)
Hemoglobin: 12.4 g/dL — ABNORMAL LOW (ref 13.0–17.0)
MCH: 29.7 pg (ref 26.0–34.0)
MCHC: 33.4 g/dL (ref 30.0–36.0)
MCV: 88.8 fL (ref 80.0–100.0)
Platelets: 151 10*3/uL (ref 150–400)
RBC: 4.18 MIL/uL — ABNORMAL LOW (ref 4.22–5.81)
RDW: 13.6 % (ref 11.5–15.5)
WBC: 6.4 10*3/uL (ref 4.0–10.5)
nRBC: 0 % (ref 0.0–0.2)

## 2019-06-13 LAB — URINALYSIS, COMPLETE (UACMP) WITH MICROSCOPIC
Bacteria, UA: NONE SEEN
Bilirubin Urine: NEGATIVE
Glucose, UA: NEGATIVE mg/dL
Ketones, ur: NEGATIVE mg/dL
Leukocytes,Ua: NEGATIVE
Nitrite: NEGATIVE
Protein, ur: NEGATIVE mg/dL
Specific Gravity, Urine: 1.014 (ref 1.005–1.030)
pH: 5 (ref 5.0–8.0)

## 2019-06-13 LAB — LIPASE, BLOOD: Lipase: 50 U/L (ref 11–51)

## 2019-06-13 LAB — TROPONIN I (HIGH SENSITIVITY): Troponin I (High Sensitivity): 16 ng/L (ref <18)

## 2019-06-13 MED ORDER — IOHEXOL 350 MG/ML SOLN
100.0000 mL | Freq: Once | INTRAVENOUS | Status: AC | PRN
  Administered 2019-06-13: 100 mL via INTRAVENOUS

## 2019-06-13 MED ORDER — FOSFOMYCIN TROMETHAMINE 3 G PO PACK: 3.0000 g | PACK | Freq: Once | ORAL | 0 refills | Status: AC

## 2019-06-13 MED ORDER — FOSFOMYCIN TROMETHAMINE 3 G PO PACK
3.0000 g | PACK | Freq: Once | ORAL | Status: AC
  Administered 2019-06-13: 3 g via ORAL
  Filled 2019-06-13: qty 3

## 2019-06-13 NOTE — ED Provider Notes (Signed)
[facility] Emergency Department Provider Note  Time seen: 10:38 PM  I have reviewed the triage vital signs and the nursing notes.   HISTORY  Chief Complaint Abdominal Pain   HPI Jay [name] is a 86 y.o. male with a past medical history of hyperlipidemia, hypothyroidism, type II heart block, CAD, aortic aneurysmal repair, presents to the emergency department for back pain.  According to the patient for the past 4 days he has been experiencing moderate pain in his right back which radiates into his abdomen.  Patient denies any dysuria or hematuria.  Has a history of 1 prior kidney stone.  Denies any diarrhea or constipation, nausea vomiting or fever.  No shortness of breath or chest pain.   Past Medical History:  Diagnosis Date  . Allergic rhinitis    ,RAD wheezing  . Cataracts, bilateral   . Diverticulosis    , ACBE, flex, in 2003  . Dyslipidemia   . Fracture of left clavicle    , Hx  . Hearing loss    , Bilateral  . Hypothyroidism   . Insomnia   . Kidney stone   . OA (optic atrophy)   . Osteoarthritis   . Pelvic fracture (HCC)    , Hx  . RBBB (right bundle branch block)    , seen on EKG  . Varicose veins     Patient Active Problem List   Diagnosis Date Noted  . Complete heart block ([place]) 05/31/2017  . Second degree AV block, Mobitz type II 05/30/2017  . SOB (shortness of breath)   . Coronary artery disease due to lipid rich plaque   . Solar purpura ([place]) 12/28/2016  . Ruptured abdominal aortic aneurysm (AAA) ([place]) 06/23/2016  . Chest pain 03/30/2016  . Essential hypertension 03/30/2016  . CAD (coronary artery disease), native coronary artery 03/30/2016  . Dizziness 03/30/2016  . H/O aortic valve replacement 10/03/2013  . Hypothyroidism   . Dyslipidemia   . RBBB (right bundle branch block)     Past Surgical History:  Procedure Laterality Date  . ABDOMINAL AORTOGRAM N/A 06/22/2016   Procedure: Abdominal Aortogram;  Surgeon: Katha Cabal, MD;  Location: [facility];  Service: Cardiovascular;  Laterality: N/A;  . AORTIC INTERVENTION N/A 06/22/2016   Procedure: Aortic Intervention;  Surgeon: Katha Cabal, MD;  Location: [facility];  Service: Cardiovascular;  Laterality: N/A;  . AORTIC VALVE REPLACEMENT  2011  . CATARACT EXTRACTION W/PHACO Right 02/17/2016   Procedure: CATARACT EXTRACTION PHACO AND INTRAOCULAR LENS PLACEMENT (IOC);  Surgeon: Ronnell Freshwater, MD;  Location: [facility];  Service: Ophthalmology;  Laterality: Right;  RIGHT  . INGUINAL HERNIA REPAIR Bilateral   . Orchectomy Right   . PACEMAKER IMPLANT N/A 05/31/2017   Procedure: PACEMAKER IMPLANT;  Surgeon: Evans Lance, MD;  Location: [facility];  Service: Cardiovascular;  Laterality: N/A;  . TEMPORARY PACEMAKER N/A 05/31/2017   Procedure: TEMPORARY PACEMAKER;  Surgeon: Sherren Mocha, MD;  Location: [facility];  Service: Cardiovascular;  Laterality: N/A;  . THYROIDECTOMY      Prior to Admission medications   Medication Sig Start Date End Date Taking? Authorizing Provider  albuterol (PROVENTIL HFA;VENTOLIN HFA) 108 (90 BASE) MCG/ACT inhaler Inhale 2 puffs into the lungs every 6 (six) hours as needed for wheezing or shortness of breath.    [provider]  aspirin EC 81 MG tablet Take 1 tablet (81 mg total) by mouth daily. 12/28/16  Jerline Pain, MD  atorvastatin (LIPITOR) 10 MG tablet Take 10 mg by mouth daily.    [provider]  levothyroxine (SYNTHROID, LEVOTHROID) 88 MCG tablet Take 88 mcg by mouth daily before breakfast.    [provider]  saw palmetto 500 MG capsule Take 500 mg by mouth daily.    [provider]    Allergies  Allergen Reactions  . Aleve [Naproxen Sodium] Hives  . Codeine Other (See Comments)    unknown  . Penicillin G Benzathine     Pt doesn't remember reaction    Family History  Problem Relation Age of Onset  . Diabetes  Mellitus II Mother     Social History Social History   Tobacco Use  . Smoking status: Former Smoker    Quit date: 03/30/1965    Years since quitting: 54.2  . Smokeless tobacco: Current User    Types: Chew  Substance Use Topics  . Alcohol use: No    Comment: Rarely  . Drug use: No    Review of Systems Constitutional: Negative for fever. Cardiovascular: Negative for chest pain. Respiratory: Negative for shortness of breath. Gastrointestinal: Negative for abdominal pain Musculoskeletal: Positive for right flank/right back pain Neurological: Negative for headache All other ROS negative  ____________________________________________   PHYSICAL EXAM:  VITAL SIGNS: ED Triage Vitals  Enc Vitals Group     BP 06/13/19 1916 138/76     Pulse Rate 06/13/19 1916 69     Resp 06/13/19 1916 16     Temp 06/13/19 1916 97.6 F (36.4 C)     Temp Source 06/13/19 1908 Oral     SpO2 06/13/19 1916 99 %     Weight 06/13/19 1908 154 lb 15.7 oz (70.3 kg)     Height --      Head Circumference --      Peak Flow --      Pain Score 06/13/19 1915 9     Pain Loc --      Pain Edu? --      Excl. in Echo? --     Constitutional: Alert and oriented. Well appearing and in no distress. Eyes: Normal exam ENT      Head: Normocephalic and atraumatic.      Mouth/Throat: Mucous membranes are moist. Cardiovascular: Normal rate, regular rhythm. No murmurs, rubs, or gallops. Respiratory: Normal respiratory effort without tachypnea nor retractions. Breath sounds are clear Gastrointestinal: Soft and nontender. No distention. Musculoskeletal: No back tenderness to palpation. Neurologic:  Normal speech and language. No gross focal neurologic deficits  Skin:  Skin is warm, dry and intact.  Psychiatric: Mood and affect are normal.  ____________________________________________    EKG  EKG viewed and interpreted by myself shows an atrial sensed ventricular paced rhythm at 71 bpm with a wide QRS, left axis  deviation, prolonged QTC, otherwise normal intervals, nonspecific ST changes.  ____________________________________________    RADIOLOGY  CTA shows infrarenal abdominal aortic aneurysm repair CT does show possible type II endoleak.  ____________________________________________   INITIAL IMPRESSION / ASSESSMENT AND PLAN / ED COURSE  Pertinent labs & imaging results that were available during my care of the patient were reviewed by me and considered in my medical decision making (see chart for details).   Patient presents to the emergency with right back pain which has become more significant over the past 4 days.  Patient states the pain is worse with movement.  No tenderness to palpation.  No abdominal tenderness.  However given the  patient's history of a AAA rupture status post repair we will obtain CT imaging with contrast to further evaluate.  CT angiography shows a possible type II endoleak.  Is not clear if this is the cause of the patient's discomfort we will discuss with vascular surgery for further recommendations.  According to my record review patient appeared to have a AAA repair on 06/22/2016 by Dr. Delana Meyer.  I spoke with Dr. Delana Meyer, aortic aneurysmal sac is not dilated.  States the type II endoleak is somewhat expected and is not a cause for pain or acute concern.  Patient's work-up is otherwise largely nonrevealing besides some blood within his urine.  I have sent a urine culture.  We will treat for a possible urinary tract infection.  Patient will follow up with his doctor.  Patient agreeable to plan of care.  [name] was evaluated in Emergency Department on 06/13/2019 for the symptoms described in the history of present illness. He was evaluated in the context of the global COVID-19 pandemic, which necessitated consideration that the patient might be at risk for infection with the SARS-CoV-2 virus that causes COVID-19. Institutional protocols and algorithms that pertain  to the evaluation of patients at risk for COVID-19 are in a state of rapid change based on information released by regulatory bodies including the CDC and federal and state organizations. These policies and algorithms were followed during the patient's care in the ED.  ____________________________________________   FINAL CLINICAL IMPRESSION(S) / ED DIAGNOSES  Back pain Hematuria   Harvest Dark, MD 06/13/19 2319

## 2019-06-13 NOTE — ED Triage Notes (Signed)
Pt to ED reporting right sided flank and abd pain that is sharp and shooting in nature. Pain worsens with movement and when pressure is applied. Pt was seen last year for similar pain and was told he had gallstones but no intervention was needed. No NVD, fevers or changes in urine.   Pt has significant cardiac hx with an aortic aneurysm that was repaired with 7 stents and mesh. Heart valve replacement and pacemaker placement after cardiac arrest. Pt reports he had an [place] of his aneurysm in January that was not abnormal per pt.

## 2019-06-13 NOTE — Discharge Instructions (Addendum)
Your work-up today in the emergency department has shown blood in your urine but no other acute findings.  A urine culture has been sent.  Please take your antibiotic on 06/16/2019.  Return to the emergency department for any worsening pain, abdominal pain, fever, or any other symptom personally concerning to yourself.

## 2019-06-15 LAB — URINE CULTURE: Culture: <10000 — AB

## 2019-06-23 ENCOUNTER — Emergency Department: Payer: Medicare Other

## 2019-06-23 ENCOUNTER — Encounter: Payer: Self-pay | Admitting: Emergency Medicine

## 2019-06-23 ENCOUNTER — Other Ambulatory Visit: Payer: Self-pay

## 2019-06-23 ENCOUNTER — Emergency Department
Admission: EM | Admit: 2019-06-23 | Discharge: 2019-06-24 | Disposition: A | Discharge status: Home / Self Care | Payer: Medicare Other | Attending: Emergency Medicine | Admitting: Emergency Medicine

## 2019-06-23 DIAGNOSIS — Y929 Unspecified place or not applicable: Secondary | ICD-10-CM | POA: Diagnosis not present

## 2019-06-23 DIAGNOSIS — I2510 Atherosclerotic heart disease of native coronary artery without angina pectoris: Secondary | ICD-10-CM | POA: Insufficient documentation

## 2019-06-23 DIAGNOSIS — Z79899 Other long term (current) drug therapy: Secondary | ICD-10-CM | POA: Insufficient documentation

## 2019-06-23 DIAGNOSIS — S22080A Wedge compression fracture of T11-T12 vertebra, initial encounter for closed fracture: Secondary | ICD-10-CM | POA: Diagnosis not present

## 2019-06-23 DIAGNOSIS — Z87442 Personal history of urinary calculi: Secondary | ICD-10-CM | POA: Diagnosis not present

## 2019-06-23 DIAGNOSIS — R34 Anuria and oliguria: Secondary | ICD-10-CM | POA: Diagnosis not present

## 2019-06-23 DIAGNOSIS — Z7982 Long term (current) use of aspirin: Secondary | ICD-10-CM | POA: Insufficient documentation

## 2019-06-23 DIAGNOSIS — I714 Abdominal aortic aneurysm, without rupture: Secondary | ICD-10-CM | POA: Diagnosis not present

## 2019-06-23 DIAGNOSIS — X58XXXA Exposure to other specified factors, initial encounter: Secondary | ICD-10-CM | POA: Insufficient documentation

## 2019-06-23 DIAGNOSIS — R1011 Right upper quadrant pain: Secondary | ICD-10-CM | POA: Diagnosis present

## 2019-06-23 DIAGNOSIS — Y9389 Activity, other specified: Secondary | ICD-10-CM | POA: Diagnosis not present

## 2019-06-23 DIAGNOSIS — I119 Hypertensive heart disease without heart failure: Secondary | ICD-10-CM | POA: Insufficient documentation

## 2019-06-23 DIAGNOSIS — Y999 Unspecified external cause status: Secondary | ICD-10-CM | POA: Insufficient documentation

## 2019-06-23 DIAGNOSIS — J439 Emphysema, unspecified: Secondary | ICD-10-CM | POA: Diagnosis not present

## 2019-06-23 LAB — CBC
HCT: 35.5 % — ABNORMAL LOW (ref 39.0–52.0)
Hemoglobin: 11.8 g/dL — ABNORMAL LOW (ref 13.0–17.0)
MCH: 29.2 pg (ref 26.0–34.0)
MCHC: 33.2 g/dL (ref 30.0–36.0)
MCV: 87.9 fL (ref 80.0–100.0)
Platelets: 157 10*3/uL (ref 150–400)
RBC: 4.04 MIL/uL — ABNORMAL LOW (ref 4.22–5.81)
RDW: 13.4 % (ref 11.5–15.5)
WBC: 10.6 10*3/uL — ABNORMAL HIGH (ref 4.0–10.5)
nRBC: 0 % (ref 0.0–0.2)

## 2019-06-23 LAB — URINALYSIS, COMPLETE (UACMP) WITH MICROSCOPIC
Bacteria, UA: NONE SEEN
Bilirubin Urine: NEGATIVE
Glucose, UA: NEGATIVE mg/dL
Ketones, ur: NEGATIVE mg/dL
Leukocytes,Ua: NEGATIVE
Nitrite: NEGATIVE
Protein, ur: 30 mg/dL — AB
Specific Gravity, Urine: 1.019 (ref 1.005–1.030)
pH: 5 (ref 5.0–8.0)

## 2019-06-23 LAB — BASIC METABOLIC PANEL
Anion gap: 9 (ref 5–15)
BUN: 26 mg/dL — ABNORMAL HIGH (ref 8–23)
CO2: 25 mmol/L (ref 22–32)
Calcium: 9.2 mg/dL (ref 8.9–10.3)
Chloride: 95 mmol/L — ABNORMAL LOW (ref 98–111)
Creatinine, Ser: 1.22 mg/dL (ref 0.61–1.24)
GFR calc Af Amer: 60 mL/min — ABNORMAL LOW (ref >60)
GFR calc non Af Amer: 52 mL/min — ABNORMAL LOW (ref >60)
Glucose, Bld: 137 mg/dL — ABNORMAL HIGH (ref 70–99)
Potassium: 4.5 mmol/L (ref 3.5–5.1)
Sodium: 129 mmol/L — ABNORMAL LOW (ref 135–145)

## 2019-06-23 MED ORDER — IOHEXOL 350 MG/ML SOLN
100.0000 mL | Freq: Once | INTRAVENOUS | Status: AC | PRN
  Administered 2019-06-23: 100 mL via INTRAVENOUS

## 2019-06-23 MED ORDER — ONDANSETRON HCL 4 MG PO TABS: 4.0000 mg | ORAL_TABLET | Freq: Three times a day (TID) | ORAL | 0 refills | Status: DC | PRN

## 2019-06-23 MED ORDER — SENNA 8.6 MG PO TABS: 1.0000 | ORAL_TABLET | Freq: Every day | ORAL | 0 refills | Status: AC

## 2019-06-23 MED ORDER — OXYCODONE-ACETAMINOPHEN 5-325 MG PO TABS
1.0000 | ORAL_TABLET | Freq: Once | ORAL | Status: AC
  Administered 2019-06-23: 1 via ORAL
  Filled 2019-06-23: qty 1

## 2019-06-23 MED ORDER — LIDOCAINE 5 % EX PTCH
1.0000 | MEDICATED_PATCH | CUTANEOUS | Status: DC
  Administered 2019-06-23: 1 via TRANSDERMAL
  Filled 2019-06-23: qty 1

## 2019-06-23 MED ORDER — OXYCODONE-ACETAMINOPHEN 5-325 MG PO TABS: 1.0000 | ORAL_TABLET | ORAL | 0 refills | Status: DC | PRN

## 2019-06-23 MED ORDER — LORAZEPAM 1 MG PO TABS
1.0000 mg | ORAL_TABLET | Freq: Once | ORAL | Status: AC
  Administered 2019-06-23: 1 mg via ORAL
  Filled 2019-06-23: qty 1

## 2019-06-23 MED ORDER — FENTANYL CITRATE (PF) 100 MCG/2ML IJ SOLN
50.0000 ug | Freq: Once | INTRAMUSCULAR | Status: AC
  Administered 2019-06-23: 50 ug via INTRAVENOUS
  Filled 2019-06-23: qty 2

## 2019-06-23 MED ORDER — SODIUM CHLORIDE 0.9 % IV BOLUS
500.0000 mL | Freq: Once | INTRAVENOUS | Status: AC
  Administered 2019-06-23: 500 mL via INTRAVENOUS

## 2019-06-23 NOTE — ED Notes (Signed)
Pt transported to CT ?

## 2019-06-23 NOTE — ED Notes (Signed)
ED Provider at bedside. 

## 2019-06-23 NOTE — ED Notes (Signed)
Pt found walking out of room while daughter in room.  Patient is upset and wanting to leave.  Patient ambulatory with steady gait, not appearing in pain while walking but winces upon sitting on bed.  Patient then clutching chest, EKG performed and given to Dr. Archie Balboa.  MD also gave VORB for 1mg  PO ativan and 1 lidocaine patch.

## 2019-06-23 NOTE — Discharge Instructions (Signed)
Please follow up with Dr. Rudene Christians with orthopedic surgery. Please seek medical attention for any high fevers, chest pain, shortness of breath, change in behavior, persistent vomiting, bloody stool or any other new or concerning symptoms.

## 2019-06-23 NOTE — ED Provider Notes (Signed)
[facility] Emergency Department Provider Note   ____________________________________________   I have reviewed the triage vital signs and the nursing notes.   HISTORY  Chief Complaint Flank Pain   History limited by: Not Limited   HPI Jay [name] is a 86 y.o. male who presents to the emergency department today because of concern for right flank pain. The pain started this morning. Was severe. Still has it at time of my exam but it is not as severe as it had been. Additionally the patient states that he only urinated one time this morning and then has not had any good urination since then. Was able to get out a small amount of urine when asked for it for the test. The patient was seen in the emergency department ten days ago for the same symptoms.     Records reviewed. Per medical record review patient has a history of ER visit 10 days ago. Had work up and CT scan performed.   Past Medical History:  Diagnosis Date  . Allergic rhinitis    ,RAD wheezing  . Cataracts, bilateral   . Diverticulosis    , ACBE, flex, in 2003  . Dyslipidemia   . Fracture of left clavicle    , Hx  . Hearing loss    , Bilateral  . Hypothyroidism   . Insomnia   . Kidney stone   . OA (optic atrophy)   . Osteoarthritis   . Pelvic fracture (HCC)    , Hx  . RBBB (right bundle branch block)    , seen on EKG  . Varicose veins     Patient Active Problem List   Diagnosis Date Noted  . Complete heart block ([place]) 05/31/2017  . Second degree AV block, Mobitz type II 05/30/2017  . SOB (shortness of breath)   . Coronary artery disease due to lipid rich plaque   . Solar purpura ([place]) 12/28/2016  . Ruptured abdominal aortic aneurysm (AAA) ([place]) 06/23/2016  . Chest pain 03/30/2016  . Essential hypertension 03/30/2016  . CAD (coronary artery disease), native coronary artery 03/30/2016  . Dizziness 03/30/2016  . H/O aortic valve replacement 10/03/2013  . Hypothyroidism   .  Dyslipidemia   . RBBB (right bundle branch block)     Past Surgical History:  Procedure Laterality Date  . ABDOMINAL AORTOGRAM N/A 06/22/2016   Procedure: Abdominal Aortogram;  Surgeon: Katha Cabal, MD;  Location: [place] [facility];  Service: Cardiovascular;  Laterality: N/A;  . AORTIC INTERVENTION N/A 06/22/2016   Procedure: Aortic Intervention;  Surgeon: Katha Cabal, MD;  Location: [place] [facility];  Service: Cardiovascular;  Laterality: N/A;  . AORTIC VALVE REPLACEMENT  2011  . CATARACT EXTRACTION W/PHACO Right 02/17/2016   Procedure: CATARACT EXTRACTION PHACO AND INTRAOCULAR LENS PLACEMENT (IOC);  Surgeon: Ronnell Freshwater, MD;  Location: [place];  Service: Ophthalmology;  Laterality: Right;  RIGHT  . INGUINAL HERNIA REPAIR Bilateral   . Orchectomy Right   . PACEMAKER IMPLANT N/A 05/31/2017   Procedure: PACEMAKER IMPLANT;  Surgeon: Evans Lance, MD;  Location: [place] [facility];  Service: Cardiovascular;  Laterality: N/A;  . TEMPORARY PACEMAKER N/A 05/31/2017   Procedure: TEMPORARY PACEMAKER;  Surgeon: Sherren Mocha, MD;  Location: [place] [facility];  Service: Cardiovascular;  Laterality: N/A;  . THYROIDECTOMY      Prior to Admission medications   Medication Sig Start Date End Date Taking? Authorizing Provider  albuterol (PROVENTIL HFA;VENTOLIN HFA) 108 (90  BASE) MCG/ACT inhaler Inhale 2 puffs into the lungs every 6 (six) hours as needed for wheezing or shortness of breath.    [provider]  aspirin EC 81 MG tablet Take 1 tablet (81 mg total) by mouth daily. 12/28/16   Jerline Pain, MD  atorvastatin (LIPITOR) 10 MG tablet Take 10 mg by mouth daily.    [provider]  levothyroxine (SYNTHROID, LEVOTHROID) 88 MCG tablet Take 88 mcg by mouth daily before breakfast.    [provider]  saw palmetto 500 MG capsule Take 500 mg by mouth daily.    [provider]    Allergies Aleve [naproxen sodium], Codeine,  and Penicillin g benzathine  Family History  Problem Relation Age of Onset  . Diabetes Mellitus II Mother     Social History Social History   Tobacco Use  . Smoking status: Former Smoker    Quit date: 03/30/1965    Years since quitting: 54.2  . Smokeless tobacco: Current User    Types: Chew  Substance Use Topics  . Alcohol use: No    Comment: Rarely  . Drug use: No    Review of Systems Constitutional: No fever/chills Eyes: No visual changes. ENT: No sore throat. Cardiovascular: Denies chest pain. Respiratory: Denies shortness of breath. Gastrointestinal: Positive for right flank pain. Genitourinary: Positive for decreased urination.  Musculoskeletal: Negative for back pain. Skin: Negative for rash. Neurological: Negative for headaches, focal weakness or numbness.  ____________________________________________   PHYSICAL EXAM:  VITAL SIGNS: ED Triage Vitals  Enc Vitals Group     BP 06/23/19 1603 (!) 171/79     Pulse Rate 06/23/19 1603 83     Resp 06/23/19 1603 16     Temp 06/23/19 1603 98.2 F (36.8 C)     Temp Source 06/23/19 1603 Oral     SpO2 06/23/19 1603 99 %     Weight 06/23/19 1559 154 lb 15.7 oz (70.3 kg)     Height --      Head Circumference --      Peak Flow --      Pain Score 06/23/19 1558 8   Constitutional: Alert and oriented.  Eyes: Conjunctivae are normal.  ENT      Head: Normocephalic and atraumatic.      Nose: No congestion/rhinnorhea.      Mouth/Throat: Mucous membranes are moist.      Neck: No stridor. Hematological/Lymphatic/Immunilogical: No cervical lymphadenopathy. Cardiovascular: Normal rate, regular rhythm.  No murmurs, rubs, or gallops.  Respiratory: Normal respiratory effort without tachypnea nor retractions. Breath sounds are clear and equal bilaterally. No wheezes/rales/rhonchi. Gastrointestinal: Soft and non tender. No rebound. No guarding.  Genitourinary: Deferred Musculoskeletal: Normal range of motion in all extremities.  No lower extremity edema. Neurologic:  Normal speech and language. No gross focal neurologic deficits are appreciated.  Skin:  Skin is warm, dry and intact. No rash noted. Psychiatric: Mood and affect are normal. Speech and behavior are normal. Patient exhibits appropriate insight and judgment.  ____________________________________________    LABS (pertinent positives/negatives)  BMP na 129, k 4.5, glu 137, cr 1.22 CBC wbc 10.9, hgb 11.8, plt 157 UA hazy, moderate hgb dipstick, protein 30, 11-20 RBC, 0-5 WBC  ____________________________________________   EKG  None  ____________________________________________    RADIOLOGY  CT angio chest/abd/pel for dissection Compression fracture of T12 with 35% height loss  ____________________________________________   PROCEDURES  Procedures  ____________________________________________   INITIAL IMPRESSION / ASSESSMENT AND PLAN / ED COURSE  Pertinent labs &  imaging results that were available during my care of the patient were reviewed by me and considered in my medical decision making (see chart for details).   Patient presented to the emergency department today because of concerns for recurrence of low back pain.  He was seen in the ED 10 days ago for similar pain.  Negative work-up at that time.  CT scan was repeated today and does show T12 compression fracture.  I discussed this finding with the patient and family.  Will have patient placed in brace.  The patient feels better with brace pain medication I do think is reasonable for patient be discharged home.  Will have him follow-up with orthopedic surgery.  ____________________________________________   FINAL CLINICAL IMPRESSION(S) / ED DIAGNOSES  Final diagnoses:  Compression fracture of T12 vertebra, initial encounter [facility])     Note: This dictation was prepared with Dragon dictation. Any transcriptional errors that result from this process are unintentional      Nance Pear, MD 06/23/19 2316

## 2019-06-23 NOTE — ED Triage Notes (Signed)
Pt here for left flank pain and urinary retention.  Was here 1 week ago for same.  It got some better per pt then came back.  Reports no urination today.  No fevers. No vomiting.  Pt was treated for UTI.  AAA with endoleak found last week, but per notes no reason for the pain and was no concern per EDP consult with vascular.  No abdominal pain.

## 2019-06-23 NOTE — Progress Notes (Addendum)
Orthopedic Tech Progress Note Patient Details:  Jay [name] 04-14-1925 [id number]  Patient ID: [name], male   DOB: [birth year], 86 y.o.   MRN: [id number] Called in order to [place] 06/23/2019, 11:04 PM

## 2019-06-24 NOTE — ED Notes (Signed)
Ortho tech at bedside fitting patient for back brace.

## 2019-06-26 DIAGNOSIS — S22080A Wedge compression fracture of T11-T12 vertebra, initial encounter for closed fracture: Secondary | ICD-10-CM | POA: Diagnosis not present

## 2019-06-27 ENCOUNTER — Other Ambulatory Visit: Payer: Self-pay | Admitting: Orthopedic Surgery

## 2019-06-28 ENCOUNTER — Other Ambulatory Visit: Payer: Self-pay

## 2019-06-28 ENCOUNTER — Encounter
Admission: RE | Admit: 2019-06-28 | Discharge: 2019-06-28 | Disposition: A | Discharge status: Home / Self Care | Payer: Medicare Other | Source: Ambulatory Visit | Attending: Orthopedic Surgery | Admitting: Orthopedic Surgery

## 2019-06-28 ENCOUNTER — Other Ambulatory Visit
Admission: RE | Admit: 2019-06-28 | Discharge: 2019-06-28 | Disposition: A | Discharge status: Home / Self Care | Payer: Medicare Other | Source: Ambulatory Visit | Attending: Orthopedic Surgery | Admitting: Orthopedic Surgery

## 2019-06-28 DIAGNOSIS — Z20822 Contact with and (suspected) exposure to covid-19: Secondary | ICD-10-CM | POA: Insufficient documentation

## 2019-06-28 DIAGNOSIS — Z01812 Encounter for preprocedural laboratory examination: Secondary | ICD-10-CM | POA: Insufficient documentation

## 2019-06-28 HISTORY — DX: Personal history of urinary calculi: Z87.442

## 2019-06-28 HISTORY — DX: Other complications of anesthesia, initial encounter: T88.59XA

## 2019-06-28 LAB — SARS CORONAVIRUS 2 (TAT 6-24 HRS): SARS Coronavirus 2: NEGATIVE

## 2019-06-28 MED ORDER — CLINDAMYCIN PHOSPHATE 900 MG/50ML IV SOLN
900.0000 mg | INTRAVENOUS | Status: AC
  Administered 2019-06-29: 900 mg via INTRAVENOUS

## 2019-06-28 NOTE — Patient Instructions (Signed)
Your procedure is scheduled on: 06-29-19 THURSDAY Report to Same Day Surgery 2nd floor medical mall [facility] Entrance-take elevator on left to 2nd floor.  Check in with surgery information desk.) @ 12:45 PM  Remember: Instructions that are not followed completely may result in serious medical risk, up to and including death, or upon the discretion of your surgeon and anesthesiologist your surgery may need to be rescheduled.    _x___ 1. Do not eat food after midnight the night before your procedure. NO GUM OR CANDY AFTER MIDNIGHT. You may drink clear liquids up to 2 hours before you are scheduled to arrive at the hospital for your procedure.  Do not drink clear liquids within 2 hours of your scheduled arrival to the hospital.  Clear liquids include  --Water or Apple juice without pulp  --Gatorade  --Black Coffee or Clear Tea (No milk, no creamers, do not add anything to the coffee or Tea   ____Ensure clear carbohydrate drink on the way to the hospital for bariatric patients  ____Ensure clear carbohydrate drink 3 hours before surgery.    __x__ 2. No Alcohol for 24 hours before or after surgery.   __x__3. No Smoking or e-cigarettes for 24 prior to surgery.  Do not use any chewable tobacco products for at least 6 hour prior to surgery   ____  4. Bring all medications with you on the day of surgery if instructed.    __x__ 5. Notify your doctor if there is any change in your medical condition     (cold, fever, infections).    x___6. On the morning of surgery brush your teeth with toothpaste and water.  You may rinse your mouth with mouth wash if you wish.  Do not swallow any toothpaste or mouthwash.   Do not wear jewelry, make-up, hairpins, clips or nail polish.  Do not wear lotions, powders, or perfumes. You may wear deodorant.  Do not shave 48 hours prior to surgery. Men may shave face and neck.  Do not bring valuables to the hospital.    [facility] is not responsible for any  belongings or valuables.               Contacts, dentures or bridgework may not be worn into surgery.  Leave your suitcase in the car. After surgery it may be brought to your room.  For patients admitted to the hospital, discharge time is determined by your treatment team.  _  Patients discharged the day of surgery will not be allowed to drive home.  You will need someone to drive you home and stay with you the night of your procedure.    Please read over the following fact sheets that you were given:   [facility] Preparing for Surgery  _x___ TAKE THE FOLLOWING MEDICATION THE MORNING OF SURGERY WITH A SMALL SIP OF WATER. These include:  1. SYNTHROID (LEVOTHYROXINE)  2.  3.  4.  5.  6.  ____Fleets enema or Magnesium Citrate as directed.   ____ Use CHG Soap or sage wipes as directed on instruction sheet   _X___ BRING ALBUTEROL [place]  ____ Stop Metformin and Janumet 2 days prior to surgery.    ____ Take 1/2 of usual insulin dose the night before surgery and none on the morning surgery.   _x___ Follow recommendations from Cardiologist, Pulmonologist or PCP regarding stopping Aspirin, Coumadin, Plavix ,Eliquis, Effient, or Pradaxa, and Pletal-PTSDAUGHTER PAMELA SAID THAT CHRIS GAINES PA STATED OK TO CONTINUE ASPIRIN  X____Stop Anti-inflammatories such as Advil, Aleve, Ibuprofen, Motrin, Naproxen, Naprosyn, Goodies powders or aspirin products NOW-OK to take Tylenol    ____ Stop supplements until after surgery.     ____ Bring C-Pap to the hospital.

## 2019-06-28 NOTE — Pre-Procedure Instructions (Signed)
Leanor Kail, [place]  Physician Assistant  Cardiology     Progress Notes  Signed     Encounter Date:  02/01/2019                  Signed          Expand AllCollapse All            Expand widget buttonCollapse widget button    Show:Clear all   ManualTemplateCopied  Added by:     Leanor Kail, PA   Hover for detailscustomization button                                                                                                                                                                                                                                                untitled image       Virtual Visit via Video Note       This visit type was conducted due to national recommendations for restrictions regarding the COVID-19 Pandemic (e.g. social distancing) in an effort to limit this patient's exposure and mitigate transmission in our community.  Due to his co-morbid illnesses, this patient is at least at moderate risk for complications without adequate follow up.  This format is felt to be most appropriate for this patient at this time.  All issues noted in this document were discussed and addressed.  A limited physical exam was performed with this format.  Please refer to the patient's chart for his consent to telehealth for [facility].      Date:  02/01/2019      ID:  Jay [name], DOB [birth year], MRN [id number]     Patient Location: Home  Provider Location: Home     PCP:  Jerline Pain, MD       Cardiologist:  Dr. Marlou Porch   Electrophysiologist: Dr. Lovena Le     Evaluation Performed:  Follow-Up Visit     Chief Complaint:  6 weeks follow up      History of Present  Illness:        Jay [name] is a 86 y.o. 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     He has an aortic valve  replacement #23 pericardial Edwards valve placed on 10/31/2009.  Abdominal aortic aneurysm was treated at [facility].  Has 30% LAD disease, left circumflex with mild aneurysmal dilatation but no stenosis.  Right coronary was small and nondominant. Prior hx of orthostatic hypotension. Last seen by Dr. Marlou Porch 11/15/2018.     Seen in ER 12/17/2018 for acute right flank pain. Renal CT without acute findings. Stable abdominal aortic aneurysm sac at 6.7cm.      Seen by me 12/21/2018 for pre syncope and low blood pressure. No LOC.  He was orthostatic in clinic by vitals. Dr. Marlou Porch recommended conservative approach with increased fluid intake (especially in the morning as blood pressure runs low), salt stabilization and compression stocking.  Advised to get balance before walking.     Seen for follow up. Daughter helped for video visit. No falls since last office visit getting balance before movement. SBP fluctuates 100-150s. No chest pain, palpitations, LE edema, melena or syncope.      The patient does not have symptoms concerning for COVID-19 infection (fever, chills, cough, or new shortness of breath).              Past Medical History:    Diagnosis   Date    .   Allergic rhinitis            ,RAD wheezing    .   Cataracts, bilateral        .   Diverticulosis            , ACBE, flex, in 2003    .   Dyslipidemia        .   Fracture of left clavicle            , Hx    .   Hearing loss            , Bilateral    .   Hypothyroidism        .   Insomnia        .   Kidney stone        .   OA (optic atrophy)        .   Osteoarthritis         .   Pelvic fracture (HCC)            , Hx    .   RBBB (right bundle branch block)            , seen on EKG    .   Varicose veins                 Past Surgical History:    Procedure   Laterality   Date    .   ABDOMINAL AORTOGRAM   N/A   06/22/2016        Procedure: Abdominal Aortogram;  Surgeon: Katha Cabal, MD;  Location: [place] [facility];  Service: Cardiovascular;  Laterality: N/A;    .   AORTIC INTERVENTION   N/A   06/22/2016        Procedure: Aortic Intervention;  Surgeon: Katha Cabal, MD;  Location: [place] [facility];  Service: Cardiovascular;  Laterality: N/A;    .   AORTIC VALVE REPLACEMENT       2011    .   CATARACT EXTRACTION W/PHACO   Right   02/17/2016        Procedure: CATARACT EXTRACTION PHACO AND INTRAOCULAR LENS PLACEMENT (IOC);  Surgeon: Deirdre Peer  Vin-Parikh, MD;  Location: [place];  Service: Ophthalmology;  Laterality: Right;  RIGHT    .   INGUINAL HERNIA REPAIR   Bilateral        .   Orchectomy   Right        .   PACEMAKER IMPLANT   N/A   05/31/2017        Procedure: PACEMAKER IMPLANT;  Surgeon: Evans Lance, MD;  Location: [place] [facility];  Service: Cardiovascular;  Laterality: N/A;    .   TEMPORARY PACEMAKER   N/A   05/31/2017        Procedure: TEMPORARY PACEMAKER;  Surgeon: Sherren Mocha, MD;  Location: [place] [facility];  Service: Cardiovascular;  Laterality: N/A;    .   THYROIDECTOMY                  Active Medications                                                                              Allergies:   Aleve [naproxen sodium], Codeine, and Penicillin g benzathine       Social History             Tobacco Use    .   Smoking status:   Former Smoker            Quit date:   03/30/1965            Years since  quitting:   53.8    .   Smokeless tobacco:   Current User            Types:   Chew    Substance Use Topics    .   Alcohol use:   No            Comment: Rarely    .   Drug use:   No          Family Hx:  The patient's family history includes Diabetes Mellitus II in his mother.     ROS:    Please see the history of present illness.    All other systems reviewed and are negative.         Prior CV studies:     The following studies were reviewed today:  As above      Labs/Other Tests and Data Reviewed:        EKG:  No ECG reviewed.     Recent Labs:  12/17/2018: ALT 19; BUN 16; Creatinine, Ser 1.28; Hemoglobin 12.3; Platelets 135; Potassium 3.7; Sodium 138      Recent Lipid Panel  Labs (Brief)  Wt Readings from Last 3 Encounters:    02/01/19   155 lb (70.3 kg)    12/21/18   155 lb (70.3 kg)    11/21/18   157 lb (71.2 kg)           Objective:        Vital Signs:  BP 124/76   Ht 5\' 10"  (1.778 m)   Wt 155 lb (70.3 kg)   BMI 22.24 kg/m       VITAL SIGNS:  reviewed  GEN:  no acute distress  EYES:  sclerae anicteric, EOMI - Extraocular Movements Intact  RESPIRATORY:  normal respiratory effort, symmetric expansion  CARDIOVASCULAR:  no peripheral edema  SKIN:  no rash, lesions or ulcers.  MUSCULOSKELETAL:  no obvious deformities.  NEURO:  alert and oriented x 3, no obvious focal deficit  PSYCH:  normal affect      ASSESSMENT & PLAN:        1.Orthostatic hypotension   - Continue conservative approach with increased fluid intake (especially in the morning as blood pressure runs low), salt stabilization and compression stocking.  Advised to get balance before walking.     2. AAA s/p endovascular repair  -  Stable. Followed by vascular     3. S/p AVR  - Normal function valve by last echo 05/2017     COVID-19 Education:  The signs and symptoms of COVID-19 were discussed with the patient and how to seek care for testing (follow up with PCP or arrange E-visit). The importance of social distancing was discussed today.     Time:    Today, I have spent 10  minutes with the patient with telehealth technology discussing the above problems.          Medication Adjustments/Labs and Tests Ordered:  Current medicines are reviewed at length with the patient today.  Concerns regarding medicines are outlined above.      Tests Ordered:  No orders of the defined types were placed in this encounter.        Medication Changes:  No orders of the defined types were placed in this encounter.        Follow Up:  Either In Person or Virtual in 6 month(s)     Signed,  Leanor Kail, [place]   02/01/2019 11:18 AM     [place]          Electronically signed by Leanor Kail, PA at 02/01/2019 11:19 AM             Telemedicine on 02/01/2019               Detailed Report

## 2019-06-29 ENCOUNTER — Ambulatory Visit: Payer: Medicare Other | Admitting: Anesthesiology

## 2019-06-29 ENCOUNTER — Ambulatory Visit
Admission: RE | Admit: 2019-06-29 | Discharge: 2019-06-29 | Disposition: A | Discharge status: Home / Self Care | Payer: Medicare Other | Attending: Orthopedic Surgery | Admitting: Orthopedic Surgery

## 2019-06-29 ENCOUNTER — Ambulatory Visit: Payer: Medicare Other

## 2019-06-29 ENCOUNTER — Encounter
Admission: RE | Disposition: A | Discharge status: Home / Self Care | Payer: Self-pay | Source: Home / Self Care | Attending: Orthopedic Surgery

## 2019-06-29 ENCOUNTER — Other Ambulatory Visit: Payer: Self-pay

## 2019-06-29 ENCOUNTER — Encounter: Payer: Self-pay | Admitting: Orthopedic Surgery

## 2019-06-29 DIAGNOSIS — Z886 Allergy status to analgesic agent status: Secondary | ICD-10-CM | POA: Diagnosis not present

## 2019-06-29 DIAGNOSIS — Z79899 Other long term (current) drug therapy: Secondary | ICD-10-CM | POA: Diagnosis not present

## 2019-06-29 DIAGNOSIS — E7800 Pure hypercholesterolemia, unspecified: Secondary | ICD-10-CM | POA: Insufficient documentation

## 2019-06-29 DIAGNOSIS — Z9841 Cataract extraction status, right eye: Secondary | ICD-10-CM | POA: Diagnosis not present

## 2019-06-29 DIAGNOSIS — Z7982 Long term (current) use of aspirin: Secondary | ICD-10-CM | POA: Diagnosis not present

## 2019-06-29 DIAGNOSIS — Z79890 Hormone replacement therapy (postmenopausal): Secondary | ICD-10-CM | POA: Insufficient documentation

## 2019-06-29 DIAGNOSIS — Z87891 Personal history of nicotine dependence: Secondary | ICD-10-CM | POA: Diagnosis not present

## 2019-06-29 DIAGNOSIS — Z880 Allergy status to penicillin: Secondary | ICD-10-CM | POA: Insufficient documentation

## 2019-06-29 DIAGNOSIS — I2510 Atherosclerotic heart disease of native coronary artery without angina pectoris: Secondary | ICD-10-CM | POA: Insufficient documentation

## 2019-06-29 DIAGNOSIS — I10 Essential (primary) hypertension: Secondary | ICD-10-CM | POA: Diagnosis not present

## 2019-06-29 DIAGNOSIS — I739 Peripheral vascular disease, unspecified: Secondary | ICD-10-CM | POA: Insufficient documentation

## 2019-06-29 DIAGNOSIS — Z950 Presence of cardiac pacemaker: Secondary | ICD-10-CM | POA: Insufficient documentation

## 2019-06-29 DIAGNOSIS — S22080A Wedge compression fracture of T11-T12 vertebra, initial encounter for closed fracture: Secondary | ICD-10-CM | POA: Insufficient documentation

## 2019-06-29 DIAGNOSIS — J449 Chronic obstructive pulmonary disease, unspecified: Secondary | ICD-10-CM | POA: Diagnosis not present

## 2019-06-29 DIAGNOSIS — E039 Hypothyroidism, unspecified: Secondary | ICD-10-CM | POA: Insufficient documentation

## 2019-06-29 DIAGNOSIS — W19XXXA Unspecified fall, initial encounter: Secondary | ICD-10-CM | POA: Diagnosis not present

## 2019-06-29 DIAGNOSIS — E785 Hyperlipidemia, unspecified: Secondary | ICD-10-CM | POA: Diagnosis not present

## 2019-06-29 DIAGNOSIS — Z961 Presence of intraocular lens: Secondary | ICD-10-CM | POA: Insufficient documentation

## 2019-06-29 DIAGNOSIS — M4854XA Collapsed vertebra, not elsewhere classified, thoracic region, initial encounter for fracture: Secondary | ICD-10-CM | POA: Diagnosis not present

## 2019-06-29 DIAGNOSIS — Z419 Encounter for procedure for purposes other than remedying health state, unspecified: Secondary | ICD-10-CM

## 2019-06-29 HISTORY — PX: KYPHOPLASTY: SHX5884

## 2019-06-29 SURGERY — KYPHOPLASTY
Anesthesia: General | Site: Spine Thoracic

## 2019-06-29 MED ORDER — LIDOCAINE HCL (CARDIAC) PF 100 MG/5ML IV SOSY
PREFILLED_SYRINGE | INTRAVENOUS | Status: DC | PRN
  Administered 2019-06-29: 80 mg via INTRAVENOUS

## 2019-06-29 MED ORDER — EPINEPHRINE PF 1 MG/ML IJ SOLN
INTRAMUSCULAR | Status: AC
  Filled 2019-06-29: qty 1

## 2019-06-29 MED ORDER — CLINDAMYCIN PHOSPHATE 900 MG/50ML IV SOLN
INTRAVENOUS | Status: AC
  Filled 2019-06-29: qty 50

## 2019-06-29 MED ORDER — TRAMADOL HCL 50 MG PO TABS: 50.0000 mg | ORAL_TABLET | Freq: Four times a day (QID) | ORAL | 0 refills | Status: DC | PRN

## 2019-06-29 MED ORDER — PROPOFOL 10 MG/ML IV BOLUS
INTRAVENOUS | Status: DC | PRN
  Administered 2019-06-29 (×2): 20 mg via INTRAVENOUS

## 2019-06-29 MED ORDER — LIDOCAINE HCL 1 % IJ SOLN
INTRAMUSCULAR | Status: DC | PRN
  Administered 2019-06-29: 20 mL
  Administered 2019-06-29: 10 mL

## 2019-06-29 MED ORDER — FAMOTIDINE 20 MG PO TABS: 20.0000 mg | ORAL_TABLET | Freq: Once | ORAL | Status: DC

## 2019-06-29 MED ORDER — FENTANYL CITRATE (PF) 100 MCG/2ML IJ SOLN: 25.0000 ug | INTRAMUSCULAR | Status: DC | PRN

## 2019-06-29 MED ORDER — BUPIVACAINE HCL (PF) 0.5 % IJ SOLN
INTRAMUSCULAR | Status: AC
  Filled 2019-06-29: qty 30

## 2019-06-29 MED ORDER — BUPIVACAINE-EPINEPHRINE (PF) 0.5% -1:200000 IJ SOLN
INTRAMUSCULAR | Status: DC | PRN
  Administered 2019-06-29: 20 mL via PERINEURAL

## 2019-06-29 MED ORDER — LIDOCAINE HCL (PF) 1 % IJ SOLN
INTRAMUSCULAR | Status: AC
  Filled 2019-06-29: qty 60

## 2019-06-29 MED ORDER — KETAMINE HCL 50 MG/ML IJ SOLN
INTRAMUSCULAR | Status: AC
  Filled 2019-06-29: qty 10

## 2019-06-29 MED ORDER — LACTATED RINGERS IV SOLN: INTRAVENOUS | Status: DC

## 2019-06-29 MED ORDER — PROPOFOL 500 MG/50ML IV EMUL
INTRAVENOUS | Status: DC | PRN
  Administered 2019-06-29: 35 ug/kg/min via INTRAVENOUS

## 2019-06-29 SURGICAL SUPPLY — 20 items
ADH SKN CLS APL DERMABOND .7 (GAUZE/BANDAGES/DRESSINGS) ×1
CEMENT KYPHON CX01A KIT/MIXER (Cement) ×2 IMPLANT
COVER WAND RF STERILE (DRAPES) ×2 IMPLANT
DERMABOND ADVANCED (GAUZE/BANDAGES/DRESSINGS) ×1
DERMABOND ADVANCED .7 DNX12 (GAUZE/BANDAGES/DRESSINGS) ×1 IMPLANT
DEVICE BIOPSY BONE KYPHX (INSTRUMENTS) ×2 IMPLANT
DRAPE C-ARM XRAY 36X54 (DRAPES) ×2 IMPLANT
DURAPREP 26ML APPLICATOR (WOUND CARE) ×2 IMPLANT
FEE RENTAL RFA GENERATOR (MISCELLANEOUS) IMPLANT
GLOVE SURG SYN 9.0  PF PI (GLOVE) ×2
GLOVE SURG SYN 9.0 PF PI (GLOVE) ×1 IMPLANT
GOWN SRG 2XL LVL 4 RGLN SLV (GOWNS) ×1 IMPLANT
GOWN STRL NON-REIN 2XL LVL4 (GOWNS) ×2
GOWN STRL REUS W/ TWL LRG LVL3 (GOWN DISPOSABLE) ×1 IMPLANT
GOWN STRL REUS W/TWL LRG LVL3 (GOWN DISPOSABLE) ×2
PACK KYPHOPLASTY (MISCELLANEOUS) ×2 IMPLANT
RENTAL RFA GENERATOR (MISCELLANEOUS) IMPLANT
STRAP SAFETY 5IN WIDE (MISCELLANEOUS) ×2 IMPLANT
TRAY KYPHOPAK 15/3 EXPRESS 1ST (MISCELLANEOUS) ×2 IMPLANT
TRAY KYPHOPAK 20/3 EXPRESS 1ST (MISCELLANEOUS) ×1 IMPLANT

## 2019-06-29 NOTE — Anesthesia Preprocedure Evaluation (Signed)
Anesthesia Evaluation  Patient identified by MRN, date of birth, ID band Patient awake    Reviewed: Allergy & Precautions, H&P , NPO status , Patient's Chart, lab work & pertinent test results  History of Anesthesia Complications (+) history of anesthetic complications (hallucinations after AAA repair)  Airway Mallampati: II  TM Distance: >3 FB Neck ROM: full    Dental  (+) Edentulous Upper, Edentulous Lower   Pulmonary neg pulmonary ROS, neg COPD, former smoker,    breath sounds clear to auscultation       Cardiovascular hypertension, + CAD and + Peripheral Vascular Disease  + dysrhythmias + pacemaker  Rhythm:regular Rate:Normal     Neuro/Psych negative neurological ROS  negative psych ROS   GI/Hepatic negative GI ROS, Neg liver ROS,   Endo/Other  Hypothyroidism   Renal/GU negative Renal ROS  negative genitourinary   Musculoskeletal   Abdominal   Peds  Hematology negative hematology ROS (+)   Anesthesia Other Findings Past Medical History: No date: Allergic rhinitis     Comment:  ,RAD wheezing No date: Cataracts, bilateral No date: Complication of anesthesia     Comment:  hallucinations after aaa surgery and valve replacement No date: Diverticulosis     Comment:  , ACBE, flex, in 2003 No date: Dyslipidemia No date: Fracture of left clavicle     Comment:  , Hx No date: Hearing loss     Comment:  , Bilateral No date: History of kidney stones     Comment:  h/o 11/2018: Hypotension No date: Hypothyroidism No date: Insomnia 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) No date: Osteoarthritis No date: Pelvic fracture (HCC)     Comment:  , Hx No date: RBBB (right bundle branch block)     Comment:  , seen on EKG No date: Varicose veins  Past Surgical History: 06/22/2016: ABDOMINAL AORTOGRAM; N/A     Comment:  Procedure: Abdominal Aortogram;  Surgeon: Katha Cabal, MD;  Location: [place] [facility];   Service:               Cardiovascular;  Laterality: N/A; 06/22/2016: AORTIC INTERVENTION; N/A     Comment:  Procedure: Aortic Intervention;  Surgeon: Katha Cabal, MD;  Location: [place] [facility];  Service:               Cardiovascular;  Laterality: N/A; 2011: AORTIC VALVE REPLACEMENT     Comment:  COW VALVE 02/17/2016: CATARACT EXTRACTION W/PHACO; Right     Comment:  Procedure: CATARACT EXTRACTION PHACO AND INTRAOCULAR               LENS PLACEMENT ([place]);  Surgeon: Ronnell Freshwater,              MD;  Location: [place];  Service:               Ophthalmology;  Laterality: Right;  RIGHT No date: INGUINAL HERNIA REPAIR; Bilateral No date: Orchectomy; Right 05/31/2017: PACEMAKER IMPLANT; N/A     Comment:  Procedure: PACEMAKER IMPLANT;  Surgeon: Evans Lance,              MD;  Location: [place] [facility];  Service:               Cardiovascular;  Laterality: N/A; 05/31/2017: TEMPORARY PACEMAKER; N/A  Comment:  Procedure: TEMPORARY PACEMAKER;  Surgeon: Sherren Mocha, MD;  Location: [place] [facility];  Service:               Cardiovascular;  Laterality: N/A; No date: THYROIDECTOMY     Reproductive/Obstetrics negative OB ROS                             Anesthesia Physical Anesthesia Plan  ASA: III  Anesthesia Plan: General   Post-op Pain Management:    Induction:   PONV Risk Score and Plan: Propofol infusion and TIVA  Airway Management Planned: Natural Airway and Simple Face Mask  Additional Equipment:   Intra-op Plan:   Post-operative Plan:   Informed Consent: I have reviewed the patients History and Physical, chart, labs and discussed the procedure including the risks, benefits and alternatives for the proposed anesthesia with the patient or authorized representative who has indicated his/her understanding and acceptance.     Dental Advisory Given  Plan Discussed with:  Anesthesiologist  Anesthesia Plan Comments:         Anesthesia Quick Evaluation

## 2019-06-29 NOTE — H&P (Signed)
Jay [name] is a 86 y.o. male who presents today for evaluation of severe midline low back pain. Patient's had pain for couple of weeks. Believes he had a slight fall on 06/14/2019 right after he had a CT chest abdomen pelvis that did not show any type of compression fractures. Over the weekend on 06/23/2019 patient had severe back pain and underwent repeat CT scan showing a new compression fracture at T12 with 35% loss of vertebral body height. Patient's daughter states he lives at home, performs a lot of cooking and household chores. Over the last few days he is having a hard time getting up out of the chair. Unable to take care of himself. Pain is been moderate to severe. He tried taking oxycodone but was causing a lot of memory issues. He has been taking Tylenol with no relief of his pain. He denies any radicular symptoms. He cannot get comfortable with sitting or lying down. Pain is located along the mid spine along the thoracolumbar junction.  Past Medical History: Past Medical History:  Diagnosis Date  . Amblyopia  . Cataract cortical, senile  . High cholesterol  . Hypothyroidism   Past Surgical History: Past Surgical History:  Procedure Laterality Date  . EYE TRAUMA Left  eyelid injury  . open heart surgery   Past Family History: Family History  Problem Relation Age of Onset  . Macular degeneration Sister   Medications: Current Outpatient Medications Ordered in Epic  Medication Sig Dispense Refill  . aspirin 81 MG EC tablet Take 81 mg by mouth daily.  [name] atorvastatin (LIPITOR) 10 MG tablet Take 10 mg by mouth daily.  . DOCOSAHEXANOIC ACID/EPA (FISH OIL ORAL) Take by mouth daily.  [name] levothyroxine (SYNTHROID, LEVOTHROID) 88 MCG tablet Take 88 mcg by mouth daily. Take on an empty stomach with a glass of water at least 30-60 minutes before breakfast.   No current Epic-ordered facility-administered medications on file.   Allergies: Allergies  Allergen Reactions  . Aleve  [Naproxen Sodium] Hives  . Penicillins Hives    Review of Systems:  A comprehensive 14 point ROS was performed, reviewed by me today, and the pertinent orthopaedic findings are documented in the HPI.  Exam: There were no vitals taken for this visit. General:  Well developed, well nourished, appears to be in moderate pain cannot get comfortable., normal affect, antalgic gait.  HEENT: Head normocephalic, atraumatic, PERRL.   Abdomen: Soft, non tender, non distended, Bowel sounds present.  Heart: Examination of the heart reveals regular, rate, and rhythm. There is no murmur noted on ascultation. There is a normal apical pulse.  Lungs: Lungs are clear to auscultation. There is no wheeze, rhonchi, or crackles. There is normal expansion of bilateral chest walls.   Examination of the thoracolumbar spine shows tenderness to percussion along the thoracolumbar junction midline of the spine. No paravertebral muscle tenderness. He has no hip tenderness. Normal hip range of motion with no discomfort. No swelling or edema throughout the lower legs. Sensation is intact throughout bilateral lower extremities.  CLINICAL DATA: Left flank pain  EXAM: CT ANGIOGRAPHY CHEST, ABDOMEN AND PELVIS  TECHNIQUE: Multidetector CT imaging through the chest, abdomen and pelvis was performed using the standard protocol during bolus administration of intravenous contrast. Multiplanar reconstructed images and MIPs were obtained and reviewed to evaluate the vascular anatomy.  CONTRAST: 194mL OMNIPAQUE IOHEXOL 350 MG/ML SOLN  COMPARISON: June 13, 2019  FINDINGS: CTA CHEST FINDINGS  Cardiovascular: There is no evidence for thoracic aortic aneurysm or  dissection. Atherosclerotic disease is noted of the thoracic aorta. The arch vessels are patent. The main pulmonary artery is dilated measuring approximately 3.2 cm. There is no evidence for an acute pulmonary embolism centrally. There is a dual chamber  left-sided pacemaker in place. The heart size is normal. Coronary artery calcifications are noted.  Mediastinum/Nodes:  --No mediastinal or hilar lymphadenopathy.  --No axillary lymphadenopathy.  --No supraclavicular lymphadenopathy.  --Normal thyroid gland.  --there is a small hiatal hernia.  Lungs/Pleura: Moderate emphysematous changes are noted. There is some atelectasis at the right lung base. There is no pneumothorax. There is no pleural effusion.  Musculoskeletal: No chest wall abnormality. No acute or significant osseous findings.  Review of the MIP images confirms the above findings.  CTA ABDOMEN AND PELVIS FINDINGS  VASCULAR  Aorta: The patient is status post prior EVAR. There is evidence for a type 2 endoleak distally. The aneurysmal sac measures approximately 6.7 by 6.3 cm (previously measuring 6.4 by 6.3 cm in 2020. The graft limbs are patent.  Celiac: Patent without evidence of aneurysm, dissection, vasculitis or significant stenosis.  SMA: Patent without evidence of aneurysm, dissection, vasculitis or significant stenosis.  Renals: There is atherosclerotic disease at the origin of both renal arteries. Both renal arteries are patent however.  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  Inflow: Patent without evidence of aneurysm, dissection, vasculitis or significant stenosis. The patient is status post prior embolization of the right internal iliac artery.  Veins: No obvious venous abnormality within the limitations of this arterial phase study.  Review of the MIP images confirms the above findings.  NON-VASCULAR  Hepatobiliary: The liver is normal. Cholelithiasis without acute inflammation.There is no biliary ductal dilation.  Pancreas: Normal contours without ductal dilatation. No peripancreatic fluid collection.  Spleen: No splenic laceration or hematoma.  Adrenals/Urinary Tract:  --Adrenal glands: No adrenal hemorrhage.  --Right  kidney/ureter: No hydronephrosis or perinephric hematoma.  --Left kidney/ureter: No hydronephrosis or perinephric hematoma.  --Urinary bladder: Multiple bladder diverticular noted. There is some mild bladder wall thickening which may be secondary to chronic outlet obstruction.  Stomach/Bowel:  --Stomach/Duodenum: No hiatal hernia or other gastric abnormality. Normal duodenal course and caliber.  --Small bowel: No dilatation or inflammation.  --Colon: There is a large amount of stool throughout the colon. There is scattered colonic diverticula without CT evidence for diverticulitis.  --Appendix: Normal.  Lymphatic:  --No retroperitoneal lymphadenopathy.  --No mesenteric lymphadenopathy.  --No pelvic or inguinal lymphadenopathy.  Reproductive: The prostate gland is mildly enlarged.  Other: No ascites or free air. The abdominal wall is normal.  Musculoskeletal. There are old healed bilateral pelvic fractures. There is a new acute compression fracture of the T12 vertebral body resulting in approximately 35% height loss. This was not present on the patient's recent CT from June 13, 2019.  Review of the MIP images confirms the above findings.  IMPRESSION: 1. New acute compression fracture of the T12 vertebral body with approximately 35% height loss. This finding was not present on the patient's recent CT from March 16th. 2. The infrarenal abdominal aortic aneurysm status post EVAR as detailed above. 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 3. There is cholelithiasis without secondary signs of acute cholecystitis. 4. Multiple additional chronic findings as detailed above. 5. Aortic Atherosclerosis (ICD10-I70.0) and Emphysema (ICD10-J43.9).  Electronically Signed By: Constance Holster M.D. On: 06/23/2019 21:06  Impression: T12 compression fracture, initial encounter (CMS-HCC) [S22.080A] T12 compression  fracture,  initial encounter (CMS-HCC) (primary encounter diagnosis)  Plan:  11. 86 year old male with acute T12 compression fracture. Pain is severe and debilitating. Having a hard time caring for himself at home. Risks, benefits, complications of a [id number] kyphoplasty procedure have been discussed with the patient. Patient has agreed and consented to the procedure with Dr. Hessie Knows.  This note was generated in part with voice recognition software and I apologize for any typographical errors that were not detected and corrected.  Feliberto Gottron MPA-C    Electronically signed by Feliberto Gottron, PA at 06/26/2019 4:46 PM EDT  Back to top of Progress Notes Dalia Heading, CMA - 06/26/2019 3:00 PM EDT Review of Systems  Constitutional: Negative.  HENT: Positive for congestion.  Respiratory: Negative.  Cardiovascular: Negative.  Gastrointestinal: Negative.  Endocrine: Negative.  Genitourinary: Negative.  Musculoskeletal: Positive for arthralgias and back pain.  Skin: Negative.  Allergic/Immunologic: Negative.  Neurological: Negative.  Hematological: Negative.  Psychiatric/Behavioral: Negative.    Electronically signed by Dalia Heading, CMA at 06/26/2019 4:46 PM EDT  Reviewed paper H+P, will be scanned into chart. No changes noted.

## 2019-06-29 NOTE — Discharge Instructions (Addendum)
Take it easy today and tomorrow.  Pain medicine as directed. Avoid bending as much as possible for the next 2 weeks.  Avoid lifting over 5 pounds for the next 2 weeks. Remove Band-Aids on Saturday then okay to shower.   AMBULATORY SURGERY  DISCHARGE INSTRUCTIONS   1) The drugs that you were given will stay in your system until tomorrow so for the next 24 hours you should not:  A) Drive an automobile B) Make any legal decisions C) Drink any alcoholic beverage   2) You may resume regular meals tomorrow.  Today it is better to start with liquids and gradually work up to solid foods.  You may eat anything you prefer, but it is better to start with liquids, then soup and crackers, and gradually work up to solid foods.   3) Please notify your doctor immediately if you have any unusual bleeding, trouble breathing, redness and pain at the surgery site, drainage, fever, or pain not relieved by medication.   4) Additional Instructions:    Please contact your physician with any problems or Same Day Surgery at [phone], Monday through Friday 6 am to 4 pm, or [place] at [facility] number at [phone].

## 2019-06-29 NOTE — Transfer of Care (Signed)
Immediate Anesthesia Transfer of Care Note  Patient: Jay [name]  Procedure(s) Performed: T12 KYPHOPLASTY (N/A Spine Thoracic)  Patient Location: PACU  Anesthesia Type:General  Level of Consciousness: drowsy  Airway & Oxygen Therapy: Patient Spontanous Breathing and Patient connected to face mask oxygen  Post-op Assessment: Report given to RN and Post -op Vital signs reviewed and stable  Post vital signs: Reviewed  Last Vitals:  Vitals Value Taken Time  BP 169/86 06/29/19 1449  Temp    Pulse 68 06/29/19 1454  Resp 13 06/29/19 1454  SpO2 89 % 06/29/19 1454  Vitals shown include unvalidated device data.  Last Pain:  Vitals:   06/29/19 1337  TempSrc: Temporal  PainSc: 4          Complications: No apparent anesthesia complications

## 2019-06-29 NOTE — Op Note (Signed)
Date June 29, 2019  time 2:51 PM   PATIENT: Jay [name]   PRE-OPERATIVE DIAGNOSIS:  closed wedge compression fracture of T12   POST-OPERATIVE DIAGNOSIS:  closed wedge compression fracture of T12   PROCEDURE:  Procedure(s): KYPHOPLASTY T12  SURGEON: Laurene Footman, MD   ASSISTANTS: None   ANESTHESIA:   local and MAC   EBL:  No intake/output data recorded.   BLOOD ADMINISTERED:none   DRAINS: none    LOCAL MEDICATIONS USED:  MARCAINE    and XYLOCAINE    SPECIMEN:   T12 vertebral body biopsy   DISPOSITION OF SPECIMEN:  Pathology   COUNTS:  YES   TOURNIQUET:  * No tourniquets in log *   IMPLANTS: Bone cement   DICTATION: .Dragon Dictation  patient was brought to the operating room and after adequate anesthesia was obtained the patient was placed prone.  C arm was brought in in good visualization of the affected level obtained on both AP and lateral projections.  After patient identification and timeout procedures were completed, local anesthetic was infiltrated with 10 cc 1% Xylocaine infiltrated subcutaneously.  This is done the area on the  side of the planned approach.  The back was then prepped and draped in the usual sterile manner and repeat timeout procedure carried out.  A spinal needle was brought down to the pedicle on the each side of  T12 and a 50-50 mix of 1% Xylocaine half percent Sensorcaine with epinephrine total of 20 cc injected.  After allowing this to set a small incision was made and the trocar was advanced into the vertebral body in an extrapedicular fashion on each side.  Biopsy was obtained Drilling was carried out balloon inserted with inflation to  4-1/2 cc on each side.  When the cement was appropriate consistency 10 cc were injected into the vertebral body without extravasation, good fill superior to inferior endplates and from right to left sides along the inferior endplate.  After the cement had set the trochar was removed and permanent C-arm views  obtained.  The wound was closed with Dermabond followed by Band-Aid   PLAN OF CARE: Discharge to home after PACU   PATIENT DISPOSITION:  PACU - hemodynamically stable.

## 2019-06-30 NOTE — Anesthesia Postprocedure Evaluation (Signed)
Anesthesia Post Note  Patient: Jay [name]  Procedure(s) Performed: T12 KYPHOPLASTY (N/A Spine Thoracic)  Patient location during evaluation: PACU Anesthesia Type: General Level of consciousness: awake and alert Pain management: pain level controlled Vital Signs Assessment: post-procedure vital signs reviewed and stable Respiratory status: spontaneous breathing, nonlabored ventilation and respiratory function stable Cardiovascular status: blood pressure returned to baseline and stable Postop Assessment: no apparent nausea or vomiting Anesthetic complications: no     Last Vitals:  Vitals:   06/29/19 1519 06/29/19 1527  BP: (!) 176/81 (!) 171/76  Pulse: 66 65  Resp: 18 20  Temp: (!) 36.1 C 36.9 C  SpO2: 97% 99%    Last Pain:  Vitals:   06/29/19 1527  TempSrc: Tympanic  PainSc: [place]

## 2019-07-03 LAB — SURGICAL PATHOLOGY

## 2019-07-07 ENCOUNTER — Other Ambulatory Visit
Admission: RE | Admit: 2019-07-07 | Discharge: 2019-07-07 | Disposition: A | Discharge status: Home / Self Care | Payer: Medicare Other | Source: Ambulatory Visit | Attending: Orthopedic Surgery | Admitting: Orthopedic Surgery

## 2019-07-07 ENCOUNTER — Other Ambulatory Visit: Payer: Self-pay | Admitting: Orthopedic Surgery

## 2019-07-07 DIAGNOSIS — Z20822 Contact with and (suspected) exposure to covid-19: Secondary | ICD-10-CM | POA: Insufficient documentation

## 2019-07-07 DIAGNOSIS — Z01812 Encounter for preprocedural laboratory examination: Secondary | ICD-10-CM | POA: Diagnosis not present

## 2019-07-07 DIAGNOSIS — M545 Low back pain: Secondary | ICD-10-CM | POA: Diagnosis not present

## 2019-07-07 LAB — SARS CORONAVIRUS 2 (TAT 6-24 HRS): SARS Coronavirus 2: NEGATIVE

## 2019-07-10 MED ORDER — CLINDAMYCIN PHOSPHATE 900 MG/50ML IV SOLN
900.0000 mg | INTRAVENOUS | Status: AC
  Administered 2019-07-11: 900 mg via INTRAVENOUS

## 2019-07-11 ENCOUNTER — Ambulatory Visit
Admission: RE | Admit: 2019-07-11 | Discharge: 2019-07-11 | Disposition: A | Discharge status: Home / Self Care | Payer: Medicare Other | Attending: Orthopedic Surgery | Admitting: Orthopedic Surgery

## 2019-07-11 ENCOUNTER — Other Ambulatory Visit: Payer: Self-pay

## 2019-07-11 ENCOUNTER — Encounter: Payer: Self-pay | Admitting: Orthopedic Surgery

## 2019-07-11 ENCOUNTER — Ambulatory Visit: Payer: Medicare Other

## 2019-07-11 ENCOUNTER — Encounter
Admission: RE | Disposition: A | Discharge status: Home / Self Care | Payer: Self-pay | Source: Home / Self Care | Attending: Orthopedic Surgery

## 2019-07-11 DIAGNOSIS — Z885 Allergy status to narcotic agent status: Secondary | ICD-10-CM | POA: Insufficient documentation

## 2019-07-11 DIAGNOSIS — Z7982 Long term (current) use of aspirin: Secondary | ICD-10-CM | POA: Diagnosis not present

## 2019-07-11 DIAGNOSIS — Z419 Encounter for procedure for purposes other than remedying health state, unspecified: Secondary | ICD-10-CM

## 2019-07-11 DIAGNOSIS — Z886 Allergy status to analgesic agent status: Secondary | ICD-10-CM | POA: Insufficient documentation

## 2019-07-11 DIAGNOSIS — Z79899 Other long term (current) drug therapy: Secondary | ICD-10-CM | POA: Insufficient documentation

## 2019-07-11 DIAGNOSIS — I10 Essential (primary) hypertension: Secondary | ICD-10-CM | POA: Insufficient documentation

## 2019-07-11 DIAGNOSIS — M4854XA Collapsed vertebra, not elsewhere classified, thoracic region, initial encounter for fracture: Secondary | ICD-10-CM | POA: Insufficient documentation

## 2019-07-11 DIAGNOSIS — S22080A Wedge compression fracture of T11-T12 vertebra, initial encounter for closed fracture: Secondary | ICD-10-CM | POA: Diagnosis not present

## 2019-07-11 DIAGNOSIS — Z981 Arthrodesis status: Secondary | ICD-10-CM | POA: Insufficient documentation

## 2019-07-11 DIAGNOSIS — E039 Hypothyroidism, unspecified: Secondary | ICD-10-CM | POA: Insufficient documentation

## 2019-07-11 DIAGNOSIS — Z880 Allergy status to penicillin: Secondary | ICD-10-CM | POA: Diagnosis not present

## 2019-07-11 DIAGNOSIS — E7800 Pure hypercholesterolemia, unspecified: Secondary | ICD-10-CM | POA: Insufficient documentation

## 2019-07-11 DIAGNOSIS — Z79890 Hormone replacement therapy (postmenopausal): Secondary | ICD-10-CM | POA: Insufficient documentation

## 2019-07-11 DIAGNOSIS — I2510 Atherosclerotic heart disease of native coronary artery without angina pectoris: Secondary | ICD-10-CM | POA: Diagnosis not present

## 2019-07-11 HISTORY — PX: KYPHOPLASTY: SHX5884

## 2019-07-11 LAB — POCT I-STAT, CHEM 8
BUN: 15 mg/dL (ref 8–23)
Calcium, Ion: 1.06 mmol/L — ABNORMAL LOW (ref 1.15–1.40)
Chloride: 88 mmol/L — ABNORMAL LOW (ref 98–111)
Creatinine, Ser: 1.1 mg/dL (ref 0.61–1.24)
Glucose, Bld: 108 mg/dL — ABNORMAL HIGH (ref 70–99)
HCT: 35 % — ABNORMAL LOW (ref 39.0–52.0)
Hemoglobin: 11.9 g/dL — ABNORMAL LOW (ref 13.0–17.0)
Potassium: 3.5 mmol/L (ref 3.5–5.1)
Sodium: 123 mmol/L — ABNORMAL LOW (ref 135–145)
TCO2: 28 mmol/L (ref 22–32)

## 2019-07-11 SURGERY — KYPHOPLASTY
Anesthesia: General | Site: Back

## 2019-07-11 MED ORDER — CLINDAMYCIN PHOSPHATE 900 MG/50ML IV SOLN
INTRAVENOUS | Status: AC
  Filled 2019-07-11: qty 50

## 2019-07-11 MED ORDER — BUPIVACAINE-EPINEPHRINE (PF) 0.5% -1:200000 IJ SOLN
INTRAMUSCULAR | Status: DC | PRN
  Administered 2019-07-11: 15 mL

## 2019-07-11 MED ORDER — PROPOFOL 500 MG/50ML IV EMUL
INTRAVENOUS | Status: DC | PRN
  Administered 2019-07-11: 40 ug/kg/min via INTRAVENOUS

## 2019-07-11 MED ORDER — IOHEXOL 180 MG/ML  SOLN
INTRAMUSCULAR | Status: DC | PRN
  Administered 2019-07-11: 10 mL

## 2019-07-11 MED ORDER — LIDOCAINE HCL 1 % IJ SOLN
INTRAMUSCULAR | Status: DC | PRN
  Administered 2019-07-11: 15 mL
  Administered 2019-07-11: 10 mL

## 2019-07-11 MED ORDER — FENTANYL CITRATE (PF) 100 MCG/2ML IJ SOLN
25.0000 ug | INTRAMUSCULAR | Status: DC | PRN
  Administered 2019-07-11: 25 ug via INTRAVENOUS

## 2019-07-11 MED ORDER — PROPOFOL 500 MG/50ML IV EMUL
INTRAVENOUS | Status: DC | PRN
  Administered 2019-07-11: 20 mg via INTRAVENOUS

## 2019-07-11 MED ORDER — PROPOFOL 10 MG/ML IV BOLUS
INTRAVENOUS | Status: AC
  Filled 2019-07-11: qty 40

## 2019-07-11 MED ORDER — LIDOCAINE HCL (CARDIAC) PF 100 MG/5ML IV SOSY
PREFILLED_SYRINGE | INTRAVENOUS | Status: DC | PRN
  Administered 2019-07-11: 60 mg via INTRAVENOUS

## 2019-07-11 MED ORDER — FENTANYL CITRATE (PF) 100 MCG/2ML IJ SOLN
INTRAMUSCULAR | Status: AC
  Administered 2019-07-11: 25 ug via INTRAVENOUS
  Filled 2019-07-11: qty 2

## 2019-07-11 MED ORDER — SODIUM CHLORIDE 0.9 % IV SOLN: INTRAVENOUS | Status: DC

## 2019-07-11 MED ORDER — DEXMEDETOMIDINE HCL IN NACL 200 MCG/50ML IV SOLN
INTRAVENOUS | Status: DC | PRN
  Administered 2019-07-11: 12 ug via INTRAVENOUS

## 2019-07-11 SURGICAL SUPPLY — 20 items
ADH SKN CLS APL DERMABOND .7 (GAUZE/BANDAGES/DRESSINGS) ×1
CEMENT KYPHON CX01A KIT/MIXER (Cement) ×2 IMPLANT
COVER WAND RF STERILE (DRAPES) ×2 IMPLANT
DERMABOND ADVANCED (GAUZE/BANDAGES/DRESSINGS) ×1
DERMABOND ADVANCED .7 DNX12 (GAUZE/BANDAGES/DRESSINGS) ×1 IMPLANT
DEVICE BIOPSY BONE KYPHX (INSTRUMENTS) ×2 IMPLANT
DRAPE C-ARM XRAY 36X54 (DRAPES) ×2 IMPLANT
DURAPREP 26ML APPLICATOR (WOUND CARE) ×2 IMPLANT
FEE RENTAL RFA GENERATOR (MISCELLANEOUS) IMPLANT
GLOVE SURG SYN 9.0  PF PI (GLOVE) ×2
GLOVE SURG SYN 9.0 PF PI (GLOVE) ×1 IMPLANT
GOWN SRG 2XL LVL 4 RGLN SLV (GOWNS) ×1 IMPLANT
GOWN STRL NON-REIN 2XL LVL4 (GOWNS) ×2
GOWN STRL REUS W/ TWL LRG LVL3 (GOWN DISPOSABLE) ×1 IMPLANT
GOWN STRL REUS W/TWL LRG LVL3 (GOWN DISPOSABLE) ×2
PACK KYPHOPLASTY (MISCELLANEOUS) ×2 IMPLANT
RENTAL RFA GENERATOR (MISCELLANEOUS) IMPLANT
STRAP SAFETY 5IN WIDE (MISCELLANEOUS) ×1 IMPLANT
TRAY KYPHOPAK 15/3 EXPRESS 1ST (MISCELLANEOUS) ×2 IMPLANT
TRAY KYPHOPAK 20/3 EXPRESS 1ST (MISCELLANEOUS) ×1 IMPLANT

## 2019-07-11 NOTE — H&P (Signed)
Jay [name], [place] - 07/07/2019 9:45 AM EDT Formatting of this note is different from the original. Images from the original note were not included. Chief Complaint: Chief Complaint  Patient presents with  . Spine - Post Operative Visit   Jay [name] is a 86 y.o. male who presents today status post T12 kyphoplasty by Dr. Hessie Knows on 06/29/2019. Patient felt good after the surgery was able to walk and get into the vehicle and did not appear to have much pain but the next day patient developed severe increasing pain. Patient's pain is located along the midline of the thoracic spine. Pain is mimicking the pain that he was having prior to his T12 kyphoplasty. No known trauma or injury but family members note that he pops his self down in chairs when he sits. He denies any numbness tingling radicular symptoms. He is taken Tylenol and tramadol with some relief but has had some issues with constipation, has been taking some supplements for this which is have helped. His pain is moderate to severe. Family member state that he cannot get comfortable and shows severe signs of pain throughout the day with sitting as well as with ambulating.  Past Medical History: Past Medical History:  Diagnosis Date  . Amblyopia  . Cataract cortical, senile  . High cholesterol  . Hypothyroidism   Past Surgical History: Past Surgical History:  Procedure Laterality Date  . EYE TRAUMA Left  eyelid injury  . open heart surgery   Past Family History: Family History  Problem Relation Age of Onset  . Macular degeneration Sister   Medications: Current Outpatient Medications Ordered in Epic  Medication Sig Dispense Refill  . aspirin 81 MG EC tablet Take 81 mg by mouth daily.  [name] atorvastatin (LIPITOR) 10 MG tablet Take 10 mg by mouth daily.  . DOCOSAHEXANOIC ACID/EPA (FISH OIL ORAL) Take by mouth daily.  [name] levothyroxine (SYNTHROID, LEVOTHROID) 88 MCG tablet Take 88 mcg by mouth daily. Take on an  empty stomach with a glass of water at least 30-60 minutes before breakfast.  . saw palmetto 500 MG capsule Take by mouth  . SENNA LAXATIVE 8.6 mg tablet TAKE 1 TABLET (8.6 MG TOTAL) BY MOUTH DAILY FOR 14 DAYS.  [name] traMADoL (ULTRAM) 50 mg tablet Take 1 tablet (50 mg total) by mouth every 6 (six) hours as needed for Pain 30 tablet 0   No current Epic-ordered facility-administered medications on file.   Allergies: Allergies  Allergen Reactions  . Aleve [Naproxen Sodium] Hives  . Codeine Other (See Comments)  unknown  . Penicillins Hives    Review of Systems:  A comprehensive 14 point ROS was performed, reviewed by me today, and the pertinent orthopaedic findings are documented in the HPI.  Exam: BP 152/78  Wt 72.2 kg (159 lb 2.8 oz)  General:  Well developed, well nourished, no distress unless he is moving, normal affect, normal gait with no antalgic component.   HEENT: Head normocephalic, atraumatic, PERRL.   Abdomen: Soft, non tender, non distended, Bowel sounds present.  Heart: Examination of the heart reveals regular, rate, and rhythm. There is no murmur noted on ascultation. There is a normal apical pulse.  Lungs: Lungs are clear to auscultation. There is no wheeze, rhonchi, or crackles. There is normal expansion of bilateral chest walls.   Examination of thoracic spine shows patient has spinous process tenderness to percussion with grimacing just above his previous incision site along T12. He has no lumbar or sacral tenderness.  No upper thoracic tenderness with percussion. No warmth erythema or drainage. Previous incision sites are intact with Dermabond  AP and lateral views of the thoracolumbar spine are ordered interpreted by me in the office today. Impression: Methylmethacrylate present along the T12 vertebral body with good placement. There is evidence of inferior compression fracture, 50% compression of the anterior T11 vertebral body. This compression is new when  compared to previous x-rays and recent CT of the chest. No other new compression fracture seen on x-ray. Moderate lower lumbar spondylosis.  Impression: T12 compression fracture, initial encounter (CMS-HCC) [S22.080A] T12 compression fracture, initial encounter (CMS-HCC) (primary encounter diagnosis) Status post kyphoplasty T12  Plan:  1. Patient is status post T12 kyphoplasty. Was doing well for about a day but then pain returned, he is very uncomfortable, has a hard time getting around due to severe pain. He will continue with Tylenol and tramadol. X-rays today show new T11 compression fracture. Risks, benefits, complications of [id number] compression fracture were discussed with the patient. Patient has agreed and consented to the procedure with Dr. Hessie Knows on Tuesday, 07/11/2019  This note was generated in part with voice recognition software and I apologize for any typographical errors that were not detected and corrected.  Jay [name] MPA-C    Electronically signed by Jay Gottron, PA at 07/07/2019 1:20 PM EDT   Reviewed paper H+P, will be scanned into chart. No changes noted.

## 2019-07-11 NOTE — Discharge Instructions (Signed)
Try to take it easy today and tomorrow.  Remove Band-Aids on Thursday then okay to shower.  Try to avoid bending or lifting over 5 pounds for 2 weeks but try to get up and walk around as much as you can tolerate.  Call office if you are having increased pain.  AMBULATORY SURGERY  DISCHARGE INSTRUCTIONS   1) The drugs that you were given will stay in your system until tomorrow so for the next 24 hours you should not:  A) Drive an automobile B) Make any legal decisions C) Drink any alcoholic beverage   2) You may resume regular meals tomorrow.  Today it is better to start with liquids and gradually work up to solid foods.  You may eat anything you prefer, but it is better to start with liquids, then soup and crackers, and gradually work up to solid foods.   3) Please notify your doctor immediately if you have any unusual bleeding, trouble breathing, redness and pain at the surgery site, drainage, fever, or pain not relieved by medication.    4) Additional Instructions:        Please contact your physician with any problems or Same Day Surgery at [phone], Monday through Friday 6 am to 4 pm, or [place] at [facility] number at [phone].

## 2019-07-11 NOTE — Op Note (Signed)
Date June 10, 2019  time 3:58 PM   PATIENT:  Jay [name]   PRE-OPERATIVE DIAGNOSIS:  closed wedge compression fracture of T11   POST-OPERATIVE DIAGNOSIS:  closed wedge compression fracture of T11   PROCEDURE:  Procedure(s): KYPHOPLASTY T11  SURGEON: Laurene Footman, MD   ASSISTANTS: None   ANESTHESIA:   local and MAC   EBL:  No intake/output data recorded.   BLOOD ADMINISTERED:none   DRAINS: none    LOCAL MEDICATIONS USED:  MARCAINE    and XYLOCAINE    SPECIMEN:   None   DISPOSITION OF SPECIMEN:  Not applicable   COUNTS:  YES   TOURNIQUET:  * No tourniquets in log *   IMPLANTS: Bone cement   DICTATION: .Dragon Dictation  patient was brought to the operating room and after adequate anesthesia was obtained the patient was placed prone.  C arm was brought in in good visualization of the affected level obtained on both AP and lateral projections.  After patient identification and timeout procedures were completed, local anesthetic was infiltrated with 10 cc 1% Xylocaine infiltrated subcutaneously.  This is done the area on the each side of the planned approach.  The back was then prepped and draped in the usual sterile manner and repeat timeout procedure carried out.  A spinal needle was brought down to the pedicle on the each side of  T11 and a 50-50 mix of 1% Xylocaine half percent Sensorcaine with epinephrine total of 15 cc injected on each side.  After allowing this to set a small incision was made and the trocar was advanced into the vertebral body in an extrapedicular fashion on the right.  Biopsy was not obtained Drilling was carried out balloon inserted with inflation to  6 cc with this crossing the midline second stick was not required.  When the cement was appropriate consistency 7 cc were injected into the vertebral body without extravasation, good fill superior to inferior endplates and from right to left sides along the inferior endplate.  After the cement had set the  trochar was removed and permanent C-arm views obtained.  The wound was closed with Dermabond followed by Band-Aid   PLAN OF CARE: Discharge to home after PACU   PATIENT DISPOSITION:  PACU - hemodynamically stable.

## 2019-07-11 NOTE — Anesthesia Preprocedure Evaluation (Addendum)
Anesthesia Evaluation  Patient identified by MRN, date of birth, ID band Patient awake    Reviewed: Allergy & Precautions, H&P , NPO status , Patient's Chart, lab work & pertinent test results  History of Anesthesia Complications (+) history of anesthetic complications (hallucinations after AAA repair)  Airway Mallampati: II  TM Distance: >3 FB Neck ROM: full    Dental  (+) Edentulous Upper, Edentulous Lower   Pulmonary neg pulmonary ROS, neg COPD, former smoker,    breath sounds clear to auscultation       Cardiovascular hypertension, (-) angina+ CAD and + Peripheral Vascular Disease  + dysrhythmias + pacemaker  Rhythm:regular Rate:Normal     Neuro/Psych negative neurological ROS  negative psych ROS   GI/Hepatic negative GI ROS, Neg liver ROS,   Endo/Other  Hypothyroidism   Renal/GU negative Renal ROS  negative genitourinary   Musculoskeletal   Abdominal   Peds  Hematology negative hematology ROS (+)   Anesthesia Other Findings Past Medical History: No date: Allergic rhinitis     Comment:  ,RAD wheezing No date: Cataracts, bilateral No date: Complication of anesthesia     Comment:  hallucinations after aaa surgery and valve replacement No date: Diverticulosis     Comment:  , ACBE, flex, in 2003 No date: Dyslipidemia No date: Fracture of left clavicle     Comment:  , Hx No date: Hearing loss     Comment:  , Bilateral No date: History of kidney stones     Comment:  h/o 11/2018: Hypotension No date: Hypothyroidism No date: Insomnia 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) No date: Osteoarthritis No date: Pelvic fracture (HCC)     Comment:  , Hx No date: RBBB (right bundle branch block)     Comment:  , seen on EKG No date: Varicose veins  Past Surgical History: 06/22/2016: ABDOMINAL AORTOGRAM; N/A     Comment:  Procedure: Abdominal Aortogram;  Surgeon: Katha Cabal, MD;  Location: [facility];  Service:               Cardiovascular;  Laterality: N/A; 06/22/2016: AORTIC INTERVENTION; N/A     Comment:  Procedure: Aortic Intervention;  Surgeon: Katha Cabal, MD;  Location: [facility];  Service:               Cardiovascular;  Laterality: N/A; 2011: AORTIC VALVE REPLACEMENT     Comment:  COW VALVE 02/17/2016: CATARACT EXTRACTION W/PHACO; Right     Comment:  Procedure: CATARACT EXTRACTION PHACO AND INTRAOCULAR               LENS PLACEMENT ([place]);  Surgeon: Ronnell Freshwater,              MD;  Location: [facility];  Service:               Ophthalmology;  Laterality: Right;  RIGHT No date: INGUINAL HERNIA REPAIR; Bilateral No date: Orchectomy; Right 05/31/2017: PACEMAKER IMPLANT; N/A     Comment:  Procedure: PACEMAKER IMPLANT;  Surgeon: Evans Lance,              MD;  Location: [facility];  Service:               Cardiovascular;  Laterality: N/A; 05/31/2017: TEMPORARY PACEMAKER; N/A  Comment:  Procedure: TEMPORARY PACEMAKER;  Surgeon: Sherren Mocha, MD;  Location: [facility];  Service:               Cardiovascular;  Laterality: N/A; No date: THYROIDECTOMY     Reproductive/Obstetrics negative OB ROS                            Anesthesia Physical  Anesthesia Plan  ASA: III  Anesthesia Plan: General   Post-op Pain Management:    Induction:   PONV Risk Score and Plan: Propofol infusion and TIVA  Airway Management Planned: Natural Airway and Simple Face Mask  Additional Equipment:   Intra-op Plan:   Post-operative Plan:   Informed Consent: I have reviewed the patients History and Physical, chart, labs and discussed the procedure including the risks, benefits and alternatives for the proposed anesthesia with the patient or authorized representative who has indicated his/her understanding and acceptance.     Dental Advisory Given  Plan Discussed with:  Anesthesiologist  Anesthesia Plan Comments:         Anesthesia Quick Evaluation

## 2019-07-11 NOTE — Anesthesia Procedure Notes (Signed)
Date/Time: 07/11/2019 3:13 PM Performed by: Doreen Salvage, CRNA Pre-anesthesia Checklist: Patient identified, Emergency Drugs available, Suction available and Patient being monitored Patient Re-evaluated:Patient Re-evaluated prior to induction Oxygen Delivery Method: Nasal cannula Induction Type: IV induction Dental Injury: Teeth and Oropharynx as per pre-operative assessment  Comments: Nasal cannula with etCO2 monitoring

## 2019-07-11 NOTE — Transfer of Care (Signed)
Immediate Anesthesia Transfer of Care Note  Patient: Jay [name]  Procedure(s) Performed: T11 KYPHOPLASTY (N/A Back)  Patient Location: PACU  Anesthesia Type:General  Level of Consciousness: sedated  Airway & Oxygen Therapy: Patient connected to nasal cannula oxygen  Post-op Assessment: Report given to RN and Post -op Vital signs reviewed and stable  Post vital signs: Reviewed and stable  Last Vitals:  Vitals Value Taken Time  BP 108/69 07/11/19 1553  Temp 36.1 C 07/11/19 1553  Pulse 65 07/11/19 1557  Resp 14 07/11/19 1557  SpO2 100 % 07/11/19 1557  Vitals shown include unvalidated device data.  Last Pain:  Vitals:   07/11/19 1228  TempSrc: Tympanic  PainSc: 10-Worst pain ever      Patients Stated Pain Goal: 0 ([id number])  Complications: No apparent anesthesia complications

## 2019-07-13 NOTE — Anesthesia Postprocedure Evaluation (Signed)
Anesthesia Post Note  Patient: PHILLIP TOOPS  Procedure(s) Performed: T11 KYPHOPLASTY (N/A Back)  Patient location during evaluation: PACU Anesthesia Type: General Level of consciousness: awake and alert Pain management: pain level controlled Vital Signs Assessment: post-procedure vital signs reviewed and stable Respiratory status: spontaneous breathing, nonlabored ventilation, respiratory function stable and patient connected to nasal cannula oxygen Cardiovascular status: blood pressure returned to baseline and stable Postop Assessment: no apparent nausea or vomiting Anesthetic complications: no     Last Vitals:  Vitals:   07/11/19 1623 07/11/19 1638  BP: (!) 175/81 (!) 188/87  Pulse: 63 61  Resp: 14 14  Temp:  (!) 36.1 C  SpO2: 97% 99%    Last Pain:  Vitals:   07/11/19 1638  TempSrc:   PainSc: 3                  Martha Clan

## 2019-07-21 DIAGNOSIS — M810 Age-related osteoporosis without current pathological fracture: Secondary | ICD-10-CM | POA: Diagnosis not present

## 2019-07-21 DIAGNOSIS — M898X8 Other specified disorders of bone, other site: Secondary | ICD-10-CM | POA: Diagnosis not present

## 2019-07-21 DIAGNOSIS — S22080A Wedge compression fracture of T11-T12 vertebra, initial encounter for closed fracture: Secondary | ICD-10-CM | POA: Diagnosis not present

## 2019-07-21 DIAGNOSIS — S32010A Wedge compression fracture of first lumbar vertebra, initial encounter for closed fracture: Secondary | ICD-10-CM | POA: Diagnosis not present

## 2019-07-24 ENCOUNTER — Other Ambulatory Visit: Payer: Self-pay | Admitting: Orthopedic Surgery

## 2019-07-24 ENCOUNTER — Other Ambulatory Visit: Payer: Self-pay

## 2019-07-24 ENCOUNTER — Other Ambulatory Visit
Admission: RE | Admit: 2019-07-24 | Discharge: 2019-07-24 | Disposition: A | Discharge status: Home / Self Care | Payer: Medicare Other | Source: Ambulatory Visit | Attending: Orthopedic Surgery | Admitting: Orthopedic Surgery

## 2019-07-24 DIAGNOSIS — Z01812 Encounter for preprocedural laboratory examination: Secondary | ICD-10-CM | POA: Insufficient documentation

## 2019-07-24 DIAGNOSIS — Z20822 Contact with and (suspected) exposure to covid-19: Secondary | ICD-10-CM | POA: Insufficient documentation

## 2019-07-24 LAB — SARS CORONAVIRUS 2 (TAT 6-24 HRS): SARS Coronavirus 2: NEGATIVE

## 2019-07-24 MED ORDER — CLINDAMYCIN PHOSPHATE 900 MG/50ML IV SOLN
900.0000 mg | INTRAVENOUS | Status: AC
  Administered 2019-07-25: 900 mg via INTRAVENOUS

## 2019-07-25 ENCOUNTER — Ambulatory Visit
Admission: RE | Admit: 2019-07-25 | Discharge: 2019-07-25 | Disposition: A | Discharge status: Home / Self Care | Payer: Medicare Other | Attending: Orthopedic Surgery | Admitting: Orthopedic Surgery

## 2019-07-25 ENCOUNTER — Ambulatory Visit: Payer: Medicare Other | Admitting: Anesthesiology

## 2019-07-25 ENCOUNTER — Ambulatory Visit: Payer: Medicare Other

## 2019-07-25 ENCOUNTER — Encounter
Admission: RE | Disposition: A | Discharge status: Home / Self Care | Payer: Self-pay | Source: Home / Self Care | Attending: Orthopedic Surgery

## 2019-07-25 ENCOUNTER — Other Ambulatory Visit: Payer: Self-pay

## 2019-07-25 ENCOUNTER — Ambulatory Visit (HOSPITAL_COMMUNITY): Payer: Medicare Other

## 2019-07-25 ENCOUNTER — Encounter: Payer: Self-pay | Admitting: Orthopedic Surgery

## 2019-07-25 DIAGNOSIS — Z87891 Personal history of nicotine dependence: Secondary | ICD-10-CM | POA: Insufficient documentation

## 2019-07-25 DIAGNOSIS — G4700 Insomnia, unspecified: Secondary | ICD-10-CM | POA: Diagnosis not present

## 2019-07-25 DIAGNOSIS — Z9889 Other specified postprocedural states: Secondary | ICD-10-CM | POA: Diagnosis not present

## 2019-07-25 DIAGNOSIS — Z880 Allergy status to penicillin: Secondary | ICD-10-CM | POA: Insufficient documentation

## 2019-07-25 DIAGNOSIS — E7800 Pure hypercholesterolemia, unspecified: Secondary | ICD-10-CM | POA: Insufficient documentation

## 2019-07-25 DIAGNOSIS — M4856XA Collapsed vertebra, not elsewhere classified, lumbar region, initial encounter for fracture: Secondary | ICD-10-CM | POA: Insufficient documentation

## 2019-07-25 DIAGNOSIS — Z79890 Hormone replacement therapy (postmenopausal): Secondary | ICD-10-CM | POA: Diagnosis not present

## 2019-07-25 DIAGNOSIS — I10 Essential (primary) hypertension: Secondary | ICD-10-CM | POA: Insufficient documentation

## 2019-07-25 DIAGNOSIS — S32010A Wedge compression fracture of first lumbar vertebra, initial encounter for closed fracture: Secondary | ICD-10-CM | POA: Diagnosis not present

## 2019-07-25 DIAGNOSIS — Z886 Allergy status to analgesic agent status: Secondary | ICD-10-CM | POA: Diagnosis not present

## 2019-07-25 DIAGNOSIS — Z79899 Other long term (current) drug therapy: Secondary | ICD-10-CM | POA: Diagnosis not present

## 2019-07-25 DIAGNOSIS — Z885 Allergy status to narcotic agent status: Secondary | ICD-10-CM | POA: Insufficient documentation

## 2019-07-25 DIAGNOSIS — I2510 Atherosclerotic heart disease of native coronary artery without angina pectoris: Secondary | ICD-10-CM | POA: Insufficient documentation

## 2019-07-25 DIAGNOSIS — E785 Hyperlipidemia, unspecified: Secondary | ICD-10-CM | POA: Diagnosis not present

## 2019-07-25 DIAGNOSIS — I739 Peripheral vascular disease, unspecified: Secondary | ICD-10-CM | POA: Diagnosis not present

## 2019-07-25 DIAGNOSIS — Z419 Encounter for procedure for purposes other than remedying health state, unspecified: Secondary | ICD-10-CM

## 2019-07-25 DIAGNOSIS — Z981 Arthrodesis status: Secondary | ICD-10-CM | POA: Diagnosis not present

## 2019-07-25 DIAGNOSIS — E039 Hypothyroidism, unspecified: Secondary | ICD-10-CM | POA: Diagnosis not present

## 2019-07-25 DIAGNOSIS — Z7982 Long term (current) use of aspirin: Secondary | ICD-10-CM | POA: Insufficient documentation

## 2019-07-25 DIAGNOSIS — Z950 Presence of cardiac pacemaker: Secondary | ICD-10-CM | POA: Diagnosis not present

## 2019-07-25 HISTORY — PX: KYPHOPLASTY: SHX5884

## 2019-07-25 SURGERY — KYPHOPLASTY
Anesthesia: Monitor Anesthesia Care | Site: Back

## 2019-07-25 MED ORDER — IOHEXOL 180 MG/ML  SOLN
INTRAMUSCULAR | Status: DC | PRN
  Administered 2019-07-25: 20 mL

## 2019-07-25 MED ORDER — LIDOCAINE HCL (CARDIAC) PF 100 MG/5ML IV SOSY
PREFILLED_SYRINGE | INTRAVENOUS | Status: DC | PRN
  Administered 2019-07-25: 60 mg via INTRAVENOUS
  Administered 2019-07-25: 40 mg via INTRAVENOUS

## 2019-07-25 MED ORDER — GLYCOPYRROLATE 0.2 MG/ML IJ SOLN
INTRAMUSCULAR | Status: AC
  Filled 2019-07-25: qty 1

## 2019-07-25 MED ORDER — PROPOFOL 500 MG/50ML IV EMUL
INTRAVENOUS | Status: DC | PRN
  Administered 2019-07-25: 50 ug/kg/min via INTRAVENOUS
  Administered 2019-07-25: 70 ug/kg/min via INTRAVENOUS

## 2019-07-25 MED ORDER — ACETAMINOPHEN 10 MG/ML IV SOLN
INTRAVENOUS | Status: AC
  Filled 2019-07-25: qty 100

## 2019-07-25 MED ORDER — ONDANSETRON HCL 4 MG PO TABS: 4.0000 mg | ORAL_TABLET | Freq: Four times a day (QID) | ORAL | Status: DC | PRN

## 2019-07-25 MED ORDER — BUPIVACAINE HCL (PF) 0.5 % IJ SOLN
INTRAMUSCULAR | Status: AC
  Filled 2019-07-25: qty 30

## 2019-07-25 MED ORDER — ONDANSETRON HCL 4 MG/2ML IJ SOLN: 4.0000 mg | Freq: Once | INTRAMUSCULAR | Status: DC | PRN

## 2019-07-25 MED ORDER — ONDANSETRON HCL 4 MG/2ML IJ SOLN: 4.0000 mg | Freq: Four times a day (QID) | INTRAMUSCULAR | Status: DC | PRN

## 2019-07-25 MED ORDER — TRAMADOL HCL 50 MG PO TABS: 50.0000 mg | ORAL_TABLET | Freq: Four times a day (QID) | ORAL | 0 refills | Status: DC | PRN

## 2019-07-25 MED ORDER — CLINDAMYCIN PHOSPHATE 900 MG/50ML IV SOLN
INTRAVENOUS | Status: AC
  Filled 2019-07-25: qty 50

## 2019-07-25 MED ORDER — FENTANYL CITRATE (PF) 100 MCG/2ML IJ SOLN: 25.0000 ug | INTRAMUSCULAR | Status: DC | PRN

## 2019-07-25 MED ORDER — DEXMEDETOMIDINE HCL 200 MCG/2ML IV SOLN
INTRAVENOUS | Status: DC | PRN
  Administered 2019-07-25: 16 ug via INTRAVENOUS
  Administered 2019-07-25: 4 ug via INTRAVENOUS

## 2019-07-25 MED ORDER — LIDOCAINE HCL (PF) 1 % IJ SOLN
INTRAMUSCULAR | Status: AC
  Filled 2019-07-25: qty 30

## 2019-07-25 MED ORDER — EPINEPHRINE PF 1 MG/ML IJ SOLN
INTRAMUSCULAR | Status: AC
  Filled 2019-07-25: qty 1

## 2019-07-25 MED ORDER — FENTANYL CITRATE (PF) 100 MCG/2ML IJ SOLN
INTRAMUSCULAR | Status: AC
  Filled 2019-07-25: qty 2

## 2019-07-25 MED ORDER — LIDOCAINE HCL 1 % IJ SOLN
INTRAMUSCULAR | Status: DC | PRN
  Administered 2019-07-25: 20 mL

## 2019-07-25 MED ORDER — METOCLOPRAMIDE HCL 5 MG/ML IJ SOLN: 5.0000 mg | Freq: Three times a day (TID) | INTRAMUSCULAR | Status: DC | PRN

## 2019-07-25 MED ORDER — SODIUM CHLORIDE 0.9 % IV SOLN: INTRAVENOUS | Status: DC

## 2019-07-25 MED ORDER — LIDOCAINE HCL (PF) 2 % IJ SOLN
INTRAMUSCULAR | Status: AC
  Filled 2019-07-25: qty 5

## 2019-07-25 MED ORDER — LACTATED RINGERS IV SOLN: INTRAVENOUS | Status: DC | PRN

## 2019-07-25 MED ORDER — FENTANYL CITRATE (PF) 100 MCG/2ML IJ SOLN
INTRAMUSCULAR | Status: DC | PRN
  Administered 2019-07-25: 50 ug via INTRAVENOUS

## 2019-07-25 MED ORDER — METOCLOPRAMIDE HCL 10 MG PO TABS: 5.0000 mg | ORAL_TABLET | Freq: Three times a day (TID) | ORAL | Status: DC | PRN

## 2019-07-25 MED ORDER — ACETAMINOPHEN 10 MG/ML IV SOLN
INTRAVENOUS | Status: DC | PRN
  Administered 2019-07-25: 1000 mg via INTRAVENOUS

## 2019-07-25 MED ORDER — PROPOFOL 10 MG/ML IV BOLUS
INTRAVENOUS | Status: AC
  Filled 2019-07-25: qty 20

## 2019-07-25 MED ORDER — BUPIVACAINE-EPINEPHRINE (PF) 0.5% -1:200000 IJ SOLN
INTRAMUSCULAR | Status: DC | PRN
  Administered 2019-07-25: 10 mL

## 2019-07-25 SURGICAL SUPPLY — 22 items
ADH SKN CLS APL DERMABOND .7 (GAUZE/BANDAGES/DRESSINGS) ×1
CEMENT KYPHON CX01A KIT/MIXER (Cement) ×2 IMPLANT
COVER WAND RF STERILE (DRAPES) ×2 IMPLANT
DERMABOND ADVANCED (GAUZE/BANDAGES/DRESSINGS) ×1
DERMABOND ADVANCED .7 DNX12 (GAUZE/BANDAGES/DRESSINGS) ×1 IMPLANT
DEVICE BIOPSY BONE KYPH (INSTRUMENTS) IMPLANT
DEVICE BIOPSY BONE KYPHX (INSTRUMENTS) ×2 IMPLANT
DRAPE C-ARM XRAY 36X54 (DRAPES) ×2 IMPLANT
DURAPREP 26ML APPLICATOR (WOUND CARE) ×2 IMPLANT
FEE RENTAL RFA GENERATOR (MISCELLANEOUS) IMPLANT
GLOVE SURG SYN 9.0  PF PI (GLOVE) ×2
GLOVE SURG SYN 9.0 PF PI (GLOVE) ×1 IMPLANT
GOWN SRG 2XL LVL 4 RGLN SLV (GOWNS) ×1 IMPLANT
GOWN STRL NON-REIN 2XL LVL4 (GOWNS) ×2
GOWN STRL REUS W/ TWL LRG LVL3 (GOWN DISPOSABLE) ×1 IMPLANT
GOWN STRL REUS W/TWL LRG LVL3 (GOWN DISPOSABLE) ×2
PACK KYPHOPLASTY (MISCELLANEOUS) ×2 IMPLANT
RENTAL RFA GENERATOR (MISCELLANEOUS) IMPLANT
STRAP SAFETY 5IN WIDE (MISCELLANEOUS) ×2 IMPLANT
SWABSTK COMLB BENZOIN TINCTURE (MISCELLANEOUS) ×2 IMPLANT
TRAY KYPHOPAK 15/3 EXPRESS 1ST (MISCELLANEOUS) ×2 IMPLANT
TRAY KYPHOPAK 20/3 EXPRESS 1ST (MISCELLANEOUS) ×1 IMPLANT

## 2019-07-25 NOTE — H&P (Signed)
Chief Complaint  Patient presents with  . Spine - Post Operative Visit   Jay [name] is a 86 y.o. male who presents today status post T12 kyphoplasty by Dr. Hessie Knows on 06/29/2019 and T11 kyphoplasty by Dr. Hessie Knows on 07/11/2019. Patient felt good after each surgery for a couple of days. Several days after the surgery pain increased. He states he has some intermittent midline back pain, severe lower than his previous kyphoplasty's. Also having some right rib pain with getting up and sitting. No numbness tingling or radicular symptoms. No new trauma or injury. He is taken tramadol 25 mg every 6 hours as well as some Tylenol. His bowels are moving. Pain that he has a severe and debilitating any feels like he cannot take it much longer.  Past Medical History: Past Medical History:  Diagnosis Date  . Amblyopia  . Cataract cortical, senile  . High cholesterol  . Hypothyroidism   Past Surgical History: Past Surgical History:  Procedure Laterality Date  . EYE TRAUMA Left  eyelid injury  . open heart surgery   Past Family History: Family History  Problem Relation Age of Onset  . Macular degeneration Sister   Medications: Current Outpatient Medications Ordered in Epic  Medication Sig Dispense Refill  . aspirin 81 MG EC tablet Take 81 mg by mouth daily.  [name] atorvastatin (LIPITOR) 10 MG tablet Take 10 mg by mouth daily.  . DOCOSAHEXANOIC ACID/EPA (FISH OIL ORAL) Take by mouth daily.  [name] levothyroxine (SYNTHROID, LEVOTHROID) 88 MCG tablet Take 88 mcg by mouth daily. Take on an empty stomach with a glass of water at least 30-60 minutes before breakfast.  . saw palmetto 500 MG capsule Take by mouth  . SENNA LAXATIVE 8.6 mg tablet TAKE 1 TABLET (8.6 MG TOTAL) BY MOUTH DAILY FOR 14 DAYS.  [name] traMADoL (ULTRAM) 50 mg tablet Take 1 tablet (50 mg total) by mouth every 6 (six) hours as needed for Pain 30 tablet 0   No current Epic-ordered facility-administered medications on file.    Allergies: Allergies  Allergen Reactions  . Aleve [Naproxen Sodium] Hives  . Codeine Other (See Comments)  unknown  . Penicillins Hives    Review of Systems:  A comprehensive 14 point ROS was performed, reviewed by me today, and the pertinent orthopaedic findings are documented in the HPI.  Exam: BP 158/86  Wt 72.2 kg (159 lb 2.8 oz)  General:  Well developed, well nourished, no distress unless he is moving, normal affect, normal gait with no antalgic component.   HEENT: Head normocephalic, atraumatic, PERRL.   Abdomen: Soft, non tender, non distended, Bowel sounds present.  Heart: Examination of the heart reveals regular, rate, and rhythm. There is no murmur noted on ascultation. There is a normal apical pulse.  Lungs: Lungs are clear to auscultation. There is no wheeze, rhonchi, or crackles. There is normal expansion of bilateral chest walls.   Examination of thoracic spine shows patient has spinous process tenderness to percussion with grimacing just below previous incision sites of T11 and T12 kyphoplasty. He has no lumbar or sacral tenderness. No upper thoracic tenderness with percussion. No warmth erythema or drainage. Previous incision sites are intact with Dermabond. Patient has severe tenderness along the superior and posterior iliac crest on the right side with palpable rib impingement.  AP and lateral views of the thoracolumbar spine are ordered interpreted by me in the office today. Impression: Methylmethacrylate present along the T12 and T11 vertebral body with good placement.  There is new superior endplate L1 compression fracture that is new when compared to previous x-rays. There appears to be approximately 20% loss of superior endplate vertebral body height that was not present from x-rays on 07/07/2019. No other new compression fracture seen on x-ray. Moderate lower lumbar spondylosis.  Impression: Iliac crest bone pain [M89.8X8] Right iliac crest bone pain  secondary to rib impingement (primary encounter diagnosis) Status post T12 kyphoplasty Status post T11 kyphoplasty Closed compression fracture of body of L1 vertebra (CMS-HCC)  Plan:  1. Patient has done well for several days after both T11 and T12 kyphoplasty procedures but then pain returns. Today pain is severe along the upper lumbar spine midline as well as along the right rib. He has focal area of tenderness on the right rib consistent with rib tip syndrome. Patient agreed and consented to a right superior iliac crest cortisone injection. This did provide him with some relief with sitting and standing, no longer having the pain or tenderness along the right superior iliac crest. He is still having significant pain along the L1 vertebral body that continues to be quite debilitating. Patient will increase tramadol to 25 mg every 6 hours and continue with stool softeners.  2. Risks, benefits, complications of a L1 kyphoplasty have been discussed with the patient. Patient and daughter have agreed and consented to the procedure. We will schedule surgery for 07/25/2019  Right superior iliac crest cortisone injection: Tender area along the right superior iliac crest was palpated and identified. It was then cleansed with chlorhexidine alcohol and anesthetized with ethyl chloride. A bolus solution of 3 cc of 1% lidocaine, 3 cc of 0.5% bupivacaine and 1 cc of Kenalog 40 mg was injected along the ostial tendinous junction of the iliac crest. Patient saw immediate relief with palpation as well as improvement with mobility with sitting and standing with the right rib pain but continued to have some midline low back pain.  This note was generated in part with voice recognition software and I apologize for any typographical errors that were not detected and corrected.  Feliberto Gottron MPA-C    Electronically signed by Feliberto Gottron, Tracy at 07/24/2019 8:38 AM EDT   Reviewed paper H+P,  will be scanned into chart. No changes noted. injection gave little relief, continues to have severe pain in back

## 2019-07-25 NOTE — Discharge Instructions (Addendum)
AMBULATORY SURGERY  DISCHARGE INSTRUCTIONS   1) The drugs that you were given will stay in your system until tomorrow so for the next 24 hours you should not:  A) Drive an automobile B) Make any legal decisions C) Drink any alcoholic beverage   2) You may resume regular meals tomorrow.  Today it is better to start with liquids and gradually work up to solid foods.  You may eat anything you prefer, but it is better to start with liquids, then soup and crackers, and gradually work up to solid foods.   3) Please notify your doctor immediately if you have any unusual bleeding, trouble breathing, redness and pain at the surgery site, drainage, fever, or pain not relieved by medication. 4)   5) Your post-operative visit with Dr.                                     is: Date:                        Time:    Please call to schedule your post-operative visit.  6) Additional Instructions:     Take it easy today and tomorrow. Avoid bending at the waist or lifting over 5 pounds for 2 weeks. Remove Band-Aid on Thursday then okay to shower. Pain medicine as previously directed. Call office if there is a change in your condition. No driving for 2 weeks.

## 2019-07-25 NOTE — Anesthesia Preprocedure Evaluation (Signed)
Anesthesia Evaluation  Patient identified by MRN, date of birth, ID band Patient awake    Reviewed: Allergy & Precautions, H&P , NPO status , Patient's Chart, lab work & pertinent test results, reviewed documented beta blocker date and time   History of Anesthesia Complications (+) history of anesthetic complications  Airway Mallampati: II  TM Distance: >3 FB Neck ROM: full    Dental no notable dental hx. (+) Teeth Intact   Pulmonary neg pulmonary ROS, former smoker,    Pulmonary exam normal breath sounds clear to auscultation       Cardiovascular Exercise Tolerance: Poor hypertension, On Medications + CAD and + Peripheral Vascular Disease  + dysrhythmias + pacemaker  Rhythm:regular Rate:Normal     Neuro/Psych negative neurological ROS  negative psych ROS   GI/Hepatic negative GI ROS, Neg liver ROS,   Endo/Other  negative endocrine ROSdiabetesHypothyroidism   Renal/GU      Musculoskeletal   Abdominal   Peds  Hematology negative hematology ROS (+)   Anesthesia Other Findings   Reproductive/Obstetrics negative OB ROS                             Anesthesia Physical Anesthesia Plan  ASA: III  Anesthesia Plan: MAC   Post-op Pain Management:    Induction:   PONV Risk Score and Plan: 2  Airway Management Planned:   Additional Equipment:   Intra-op Plan:   Post-operative Plan:   Informed Consent: I have reviewed the patients History and Physical, chart, labs and discussed the procedure including the risks, benefits and alternatives for the proposed anesthesia with the patient or authorized representative who has indicated his/her understanding and acceptance.       Plan Discussed with: CRNA  Anesthesia Plan Comments:         Anesthesia Quick Evaluation

## 2019-07-25 NOTE — Transfer of Care (Signed)
Immediate Anesthesia Transfer of Care Note  Patient: Jay [name]  Procedure(s) Performed: L1 KYPHOPLASTY (N/A Back)  Patient Location: PACU  Anesthesia Type:General  Level of Consciousness: drowsy  Airway & Oxygen Therapy: Patient Spontanous Breathing and Patient connected to face mask oxygen  Post-op Assessment: Report given to RN and Post -op Vital signs reviewed and stable  Post vital signs: Reviewed and stable  Last Vitals:  Vitals Value Taken Time  BP 200/91 07/25/19 1711  Temp 36.5 C 07/25/19 1711  Pulse 63 07/25/19 1711  Resp 21 07/25/19 1714  SpO2 100 % 07/25/19 1711  Vitals shown include unvalidated device data.  Last Pain:  Vitals:   07/25/19 1711  TempSrc:   PainSc: Asleep         Complications: No apparent anesthesia complications

## 2019-07-25 NOTE — Op Note (Signed)
07/25/2019  5:15 PM  PATIENT:  Jay [name]  86 y.o. male  PRE-OPERATIVE DIAGNOSIS:  Closed compression fracture of body of L1 vertebra  POST-OPERATIVE DIAGNOSIS:  Closed compression fracture of body of L1 vertebra  PROCEDURE:  Procedure(s): L1 KYPHOPLASTY (N/A)  SURGEON: Laurene Footman, MD  ASSISTANTS: none  ANESTHESIA:   local and MAC  EBL:  Total I/O In: 100 [I.V.:100] Out: -   BLOOD ADMINISTERED:none  DRAINS: none   LOCAL MEDICATIONS USED:  MARCAINE    and LIDOCAINE   SPECIMEN:  No Specimen  DISPOSITION OF SPECIMEN:  N/A  COUNTS:  YES  TOURNIQUET:  * No tourniquets in log *  IMPLANTS: Bone cement  DICTATION: .Dragon Dictation   Dragon Dictationpatient was brought to the operating room and after adequate anesthesia was obtained the patient was placed prone. C arm was brought in in good visualization of the affected level obtained on both AP and lateral projections. After patient identification and timeout procedures were completed, local anesthetic was infiltrated with 10 cc 1% Xylocaine infiltrated subcutaneously. This is done the area on the each side of the planned approach. The back was then prepped and draped in the usual sterile manner and repeat timeout procedure carried out. A spinal needle was brought down to the pedicle on the  the right side of L1 and a 50-50 mix of 1% Xylocaine half percent Sensorcaine with epinephrine total of  20 cc injected e. After allowing this to set a small incision was made and the trocar was advanced into the vertebral body in an extrapedicular fashion on the right. Biopsy was not obtainedDrilling was carried out balloon inserted with inflation to  3-1/2 cc with this crossing the midline second stick was not required. When the cement was appropriate consistency  6cc were injected into the vertebral body without extravasation, good fill superior to inferior endplates and from right to left sides along the inferior endplate.  After the cement had set the trochar was removed and permanent C-arm views obtained. The wound was closed with Dermabond followed by Band-Aid  PLAN OF CARE: Discharge to home after PACU  PATIENT DISPOSITION:  PACU - hemodynamically stable.

## 2019-07-25 NOTE — Anesthesia Postprocedure Evaluation (Signed)
Anesthesia Post Note  Patient: Jay [name]  Procedure(s) Performed: L1 KYPHOPLASTY (N/A Back)  Patient location during evaluation: PACU Anesthesia Type: MAC Level of consciousness: awake and alert Pain management: pain level controlled Vital Signs Assessment: post-procedure vital signs reviewed and stable Respiratory status: spontaneous breathing, nonlabored ventilation, respiratory function stable and patient connected to nasal cannula oxygen Cardiovascular status: blood pressure returned to baseline and stable Postop Assessment: no apparent nausea or vomiting Anesthetic complications: no     Last Vitals:  Vitals:   07/25/19 1725 07/25/19 1740  BP: (!) 198/83 (!) 190/88  Pulse: (!) 59 61  Resp: 20 15  Temp:  (!) 36.1 C  SpO2: 99% 98%    Last Pain:  Vitals:   07/25/19 1740  TempSrc:   PainSc: 0-No pain                 Arita Miss

## 2019-07-31 ENCOUNTER — Other Ambulatory Visit: Payer: Self-pay | Admitting: Orthopedic Surgery

## 2019-07-31 DIAGNOSIS — Z9889 Other specified postprocedural states: Secondary | ICD-10-CM | POA: Diagnosis not present

## 2019-07-31 DIAGNOSIS — M545 Low back pain, unspecified: Secondary | ICD-10-CM

## 2019-07-31 DIAGNOSIS — S22080A Wedge compression fracture of T11-T12 vertebra, initial encounter for closed fracture: Secondary | ICD-10-CM | POA: Diagnosis not present

## 2019-08-04 ENCOUNTER — Other Ambulatory Visit: Payer: Self-pay

## 2019-08-04 ENCOUNTER — Encounter
Admission: RE | Admit: 2019-08-04 | Discharge: 2019-08-04 | Disposition: A | Discharge status: Home / Self Care | Payer: Medicare Other | Source: Ambulatory Visit | Attending: Orthopedic Surgery | Admitting: Orthopedic Surgery

## 2019-08-04 DIAGNOSIS — S22080A Wedge compression fracture of T11-T12 vertebra, initial encounter for closed fracture: Secondary | ICD-10-CM | POA: Diagnosis not present

## 2019-08-04 DIAGNOSIS — R948 Abnormal results of function studies of other organs and systems: Secondary | ICD-10-CM | POA: Diagnosis not present

## 2019-08-04 DIAGNOSIS — M545 Low back pain, unspecified: Secondary | ICD-10-CM

## 2019-08-04 MED ORDER — TECHNETIUM TC 99M MEDRONATE IV KIT
20.0000 | PACK | Freq: Once | INTRAVENOUS | Status: AC | PRN
  Administered 2019-08-04: 11:00:00 21.38 via INTRAVENOUS

## 2019-08-25 DIAGNOSIS — M62830 Muscle spasm of back: Secondary | ICD-10-CM | POA: Diagnosis not present

## 2019-08-25 DIAGNOSIS — M5136 Other intervertebral disc degeneration, lumbar region: Secondary | ICD-10-CM | POA: Diagnosis not present

## 2019-08-25 DIAGNOSIS — M5416 Radiculopathy, lumbar region: Secondary | ICD-10-CM | POA: Diagnosis not present

## 2019-08-25 DIAGNOSIS — M4726 Other spondylosis with radiculopathy, lumbar region: Secondary | ICD-10-CM | POA: Diagnosis not present

## 2019-08-29 ENCOUNTER — Ambulatory Visit (INDEPENDENT_AMBULATORY_CARE_PROVIDER_SITE_OTHER): Payer: Medicare Other | Admitting: *Deleted

## 2019-08-29 DIAGNOSIS — I442 Atrioventricular block, complete: Secondary | ICD-10-CM | POA: Diagnosis not present

## 2019-08-29 DIAGNOSIS — I4510 Unspecified right bundle-branch block: Secondary | ICD-10-CM

## 2019-08-30 DIAGNOSIS — M5416 Radiculopathy, lumbar region: Secondary | ICD-10-CM | POA: Diagnosis not present

## 2019-08-30 DIAGNOSIS — M48062 Spinal stenosis, lumbar region with neurogenic claudication: Secondary | ICD-10-CM | POA: Diagnosis not present

## 2019-08-30 DIAGNOSIS — M5136 Other intervertebral disc degeneration, lumbar region: Secondary | ICD-10-CM | POA: Diagnosis not present

## 2019-08-30 LAB — CUP PACEART REMOTE DEVICE CHECK
Battery Remaining Longevity: 108 mo
Battery Remaining Percentage: 95.5 %
Battery Voltage: 3.01 V
Brady Statistic AP VP Percent: 52 %
Brady Statistic AP VS Percent: 1 %
Brady Statistic AS VP Percent: 46 %
Brady Statistic AS VS Percent: 1 %
Brady Statistic RA Percent Paced: 49 %
Brady Statistic RV Percent Paced: 98 %
Date Time Interrogation Session: 20210601053224
Implantable Lead Implant Date: 20190304
Implantable Lead Implant Date: 20190304
Implantable Lead Location: 753859
Implantable Lead Location: 753860
Implantable Pulse Generator Implant Date: 20190304
Lead Channel Impedance Value: 390 Ohm
Lead Channel Impedance Value: 630 Ohm
Lead Channel Pacing Threshold Amplitude: 0.5 V
Lead Channel Pacing Threshold Amplitude: 0.75 V
Lead Channel Pacing Threshold Pulse Width: 0.5 ms
Lead Channel Pacing Threshold Pulse Width: 0.5 ms
Lead Channel Sensing Intrinsic Amplitude: 12 mV
Lead Channel Sensing Intrinsic Amplitude: 4.8 mV
Lead Channel Setting Pacing Amplitude: 2 V
Lead Channel Setting Pacing Amplitude: 2.5 V
Lead Channel Setting Pacing Pulse Width: 0.5 ms
Lead Channel Setting Sensing Sensitivity: 4 mV
Pulse Gen Model: 2272
Pulse Gen Serial Number: 8992729

## 2019-08-30 NOTE — Progress Notes (Signed)
Remote pacemaker transmission.   

## 2019-08-31 ENCOUNTER — Other Ambulatory Visit: Payer: Self-pay

## 2019-08-31 ENCOUNTER — Ambulatory Visit (INDEPENDENT_AMBULATORY_CARE_PROVIDER_SITE_OTHER): Payer: Medicare Other | Admitting: Cardiology

## 2019-08-31 ENCOUNTER — Encounter: Payer: Self-pay | Admitting: Cardiology

## 2019-08-31 VITALS — BP 100/60 | HR 76 | Ht 70.0 in | Wt 164.4 lb

## 2019-08-31 DIAGNOSIS — I10 Essential (primary) hypertension: Secondary | ICD-10-CM | POA: Diagnosis not present

## 2019-08-31 DIAGNOSIS — Z952 Presence of prosthetic heart valve: Secondary | ICD-10-CM

## 2019-08-31 DIAGNOSIS — I442 Atrioventricular block, complete: Secondary | ICD-10-CM | POA: Diagnosis not present

## 2019-08-31 DIAGNOSIS — Z950 Presence of cardiac pacemaker: Secondary | ICD-10-CM | POA: Diagnosis not present

## 2019-08-31 NOTE — Progress Notes (Signed)
Cardiology Office Note:    Date:  08/31/2019   ID:  Jay [name], DOB [birth year], MRN [id number]  PCP:  Wenda Low, MD  [facility] Cardiologist:  No primary care provider on file.  [facility] Electrophysiologist:  None   Referring MD: Jerline Pain, MD     History of Present Illness:    Jay [name] is a 86 y.o. male here for follow-up of complete heart block pacemaker 2019 aortic valve replacement abdominal aortic aneurysm stent graft placement 2018.  He has an aortic valve replacement #23 pericardial Edwards valve placed on 10/31/2009. Abdominal aortic aneurysm was treated at [facility]. Has 30% LAD disease, left circumflex with mild aneurysmal dilatation but no stenosis. Right coronary was small and nondominant. Prior hx of orthostatic hypotension.  Renal CT without acute findings. Stable abdominal aortic aneurysm sac at 6.7cm.   Past Medical History:  Diagnosis Date  . Allergic rhinitis    ,RAD wheezing  . Cataracts, bilateral   . Complication of anesthesia    hallucinations after aaa surgery and valve replacement  . Diverticulosis    , ACBE, flex, in 2003  . Dyslipidemia   . Fracture of left clavicle    , Hx  . Hearing loss    , Bilateral  . History of kidney stones    h/o  . Hypotension 11/2018  . Hypothyroidism   . Insomnia   . OA (optic atrophy)   . Osteoarthritis   . Pelvic fracture (HCC)    , Hx  . RBBB (right bundle branch block)    , seen on EKG  . Varicose veins     Past Surgical History:  Procedure Laterality Date  . ABDOMINAL AORTOGRAM N/A 06/22/2016   Procedure: Abdominal Aortogram;  Surgeon: Katha Cabal, MD;  Location: [place] [facility];  Service: Cardiovascular;  Laterality: N/A;  . AORTIC INTERVENTION N/A 06/22/2016   Procedure: Aortic Intervention;  Surgeon: Katha Cabal, MD;  Location: [place] [facility];  Service: Cardiovascular;  Laterality: N/A;  . AORTIC VALVE REPLACEMENT  2011   COW VALVE  . CATARACT  EXTRACTION W/PHACO Right 02/17/2016   Procedure: CATARACT EXTRACTION PHACO AND INTRAOCULAR LENS PLACEMENT (IOC);  Surgeon: Ronnell Freshwater, MD;  Location: [place];  Service: Ophthalmology;  Laterality: Right;  RIGHT  . INGUINAL HERNIA REPAIR Bilateral   . KYPHOPLASTY N/A 06/29/2019   Procedure: T12 KYPHOPLASTY;  Surgeon: Hessie Knows, MD;  Location: [facility];  Service: Orthopedics;  Laterality: N/A;  . KYPHOPLASTY N/A 07/11/2019   Procedure: T11 KYPHOPLASTY;  Surgeon: Hessie Knows, MD;  Location: [facility];  Service: Orthopedics;  Laterality: N/A;  . KYPHOPLASTY N/A 07/25/2019   Procedure: L1 KYPHOPLASTY;  Surgeon: Hessie Knows, MD;  Location: [facility];  Service: Orthopedics;  Laterality: N/A;  . Orchectomy Right   . PACEMAKER IMPLANT N/A 05/31/2017   Procedure: PACEMAKER IMPLANT;  Surgeon: Evans Lance, MD;  Location: [place] [facility];  Service: Cardiovascular;  Laterality: N/A;  . TEMPORARY PACEMAKER N/A 05/31/2017   Procedure: TEMPORARY PACEMAKER;  Surgeon: Sherren Mocha, MD;  Location: [place] [facility];  Service: Cardiovascular;  Laterality: N/A;  . THYROIDECTOMY      Current Medications: Current Meds  Medication Sig  . acetaminophen (TYLENOL 8 HOUR) 650 MG CR tablet Take 1,300 mg by mouth every 8 (eight) hours as needed for pain.  [name] albuterol (PROVENTIL HFA;VENTOLIN HFA) 108 (90 BASE) MCG/ACT inhaler Inhale 2 puffs into the lungs every 6 (six) hours  as needed for wheezing or shortness of breath.  [name] aspirin EC 81 MG tablet Take 1 tablet (81 mg total) by mouth daily.  [name] atorvastatin (LIPITOR) 10 MG tablet Take 10 mg by mouth daily at 6 PM.   . levothyroxine (SYNTHROID, LEVOTHROID) 88 MCG tablet Take 88 mcg by mouth daily before breakfast.  . ondansetron (ZOFRAN) 4 MG tablet Take 1 tablet (4 mg total) by mouth every 8 (eight) hours as needed.  . saw palmetto 500 MG capsule Take 500 mg by mouth daily.  . traMADol (ULTRAM) 50 MG tablet Take 1 tablet (50 mg total) by  mouth every 6 (six) hours as needed.     Allergies:   Aleve [naproxen sodium], Codeine, and Penicillin g benzathine   Social History   Socioeconomic History  . Marital status: Married    Spouse name: Not on file  . Number of children: Not on file  . Years of education: Not on file  . Highest education level: Not on file  Occupational History  . Not on file  Tobacco Use  . Smoking status: Former Smoker    Packs/day: 0.50    Years: 30.00    Pack years: 15.00    Types: Cigarettes    Quit date: 03/30/1965    Years since quitting: 54.4  . Smokeless tobacco: Current User    Types: Chew  Substance and Sexual Activity  . Alcohol use: No  . Drug use: No  . Sexual activity: Not on file  Other Topics Concern  . Not on file  Social History Narrative  . Not on file   Social Determinants of Health   Financial Resource Strain:   . Difficulty of Paying Living Expenses:   Food Insecurity:   . Worried About Charity fundraiser in the Last Year:   . Arboriculturist in the Last Year:   Transportation Needs:   . Film/video editor (Medical):   [name] Lack of Transportation (Non-Medical):   Physical Activity:   . Days of Exercise per Week:   . Minutes of Exercise per Session:   Stress:   . Feeling of Stress :   Social Connections:   . Frequency of Communication with Friends and Family:   . Frequency of Social Gatherings with Friends and Family:   . Attends Religious Services:   . Active Member of Clubs or Organizations:   . Attends Archivist Meetings:   [name] Marital Status:      Family History: The patient's family history includes Diabetes Mellitus II in his mother. ROS:   Please see the history of present illness.    No fevers chills nausea vomiting syncope bleeding all other systems reviewed and are negative.  EKGs/Labs/Other Studies Reviewed:    The following studies were reviewed today:  - Left ventricle: The cavity size was mildly dilated. There was  mild  concentric hypertrophy. Systolic function was normal. The  estimated ejection fraction was in the range of 60% to 65%. Wall  motion was normal; there were no regional wall motion  abnormalities. The study is not technically sufficient to allow  evaluation of LV diastolic function due to underlying second  degree AV block.  - Aortic valve: Poorly visualized. A 2.3 cmpericardial  bioprosthesis was present and functioning normally. Mean gradient  (S): 10 mm Hg. Valve area (VTI): 1.33 cm^2. Valve area (Vmax):  1.29 cm^2. Valve area (Vmean): 1.4 cm^2.  - Left atrium: The atrium was mildly dilated.  -  Pulmonary arteries: PA peak pressure: 64 mm Hg (S).   Impressions:   - The right ventricular systolic pressure was increased consistent  with moderate pulmonary hypertension.   EKG:  EKG is  ordered today.  The ekg ordered today demonstrates ventricular pacing  Recent Labs: 06/13/2019: ALT 18 06/23/2019: Platelets 157 07/11/2019: BUN 15; Creatinine, Ser 1.10; Hemoglobin 11.9; Potassium 3.5; Sodium 123  Recent Lipid Panel    Component Value Date/Time   CHOL 135 03/30/2016 0652   TRIG 68 03/30/2016 0652   HDL 38 (L) 03/30/2016 0652   CHOLHDL 3.6 03/30/2016 0652   VLDL 14 03/30/2016 0652   LDLCALC 83 03/30/2016 0652    Physical Exam:    VS:  BP 100/60   Pulse 76   Ht 5\' 10"  (1.778 m)   Wt 164 lb 6.4 oz (74.6 kg)   SpO2 99%   BMI 23.59 kg/m     Wt Readings from Last 3 Encounters:  08/31/19 164 lb 6.4 oz (74.6 kg)  07/25/19 145 lb (65.8 kg)  07/11/19 154 lb (69.9 kg)     GEN: In wheelchair Well nourished, well developed in no acute distress HEENT: Normal NECK: No JVD; No carotid bruits LYMPHATICS: No lymphadenopathy CARDIAC: RRR, 2/6 SM, no rubs, gallops RESPIRATORY:  Clear to auscultation without rales, wheezing or rhonchi  ABDOMEN: Soft, non-tender, non-distended MUSCULOSKELETAL:  No edema; No deformity  SKIN: Warm and dry NEUROLOGIC:  Alert and oriented  x 3 PSYCHIATRIC:  Normal affect   ASSESSMENT:    1. Complete heart block ([place])   2. Essential hypertension   3. Cardiac pacemaker in situ   4. H/O aortic valve replacement    PLAN:    In order of problems listed above:  Complete heart block -Pacemaker, Dr. Lovena Le.  Remembers needing temporary pacer wire.  Ventricular pacing on ECG.  Aortic valve replacement -Dental antibiotics.  Last echocardiogram 2019 good overall function.  Excellent.  Aortic aneurysm EVAR -Stable, vascular surgery.  No recent syncopal episodes. He still enjoys riding his Gator around his property.  This has been tough though with thoracic spine compression fracture.      Medication Adjustments/Labs and Tests Ordered: Current medicines are reviewed at length with the patient today.  Concerns regarding medicines are outlined above.  Orders Placed This Encounter  Procedures  . EKG 12-Lead   No orders of the defined types were placed in this encounter.   Patient Instructions  Medication Instructions:  The current medical regimen is effective;  continue present plan and medications.  *If you need a refill on your cardiac medications before your next appointment, please call your pharmacy*  Follow-Up: At [facility], you and your health needs are our priority.  As part of our continuing mission to provide you with exceptional heart care, we have created designated Provider Care Teams.  These Care Teams include your primary Cardiologist (physician) and Advanced Practice Providers (APPs -  Physician Assistants and Nurse Practitioners) who all work together to provide you with the care you need, when you need it.  We recommend signing up for the patient portal called "MyChart".  Sign up information is provided on this After Visit Summary.  MyChart is used to connect with patients for Virtual Visits (Telemedicine).  Patients are able to view lab/test results, encounter notes, upcoming appointments, etc.   Non-urgent messages can be sent to your provider as well.   To learn more about what you can do with MyChart, go to [email or web address].  Your next appointment:   12 month(s)  The format for your next appointment:   In Person  Provider:   Candee Furbish, MD   Thank you for choosing [facility]!!         Signed, Candee Furbish, MD  08/31/2019 11:58 AM    [place]

## 2019-08-31 NOTE — Patient Instructions (Signed)
Medication Instructions:  The current medical regimen is effective;  continue present plan and medications.  *If you need a refill on your cardiac medications before your next appointment, please call your pharmacy*  Follow-Up: At [facility], you and your health needs are our priority.  As part of our continuing mission to provide you with exceptional heart care, we have created designated Provider Care Teams.  These Care Teams include your primary Cardiologist (physician) and Advanced Practice Providers (APPs -  Physician Assistants and Nurse Practitioners) who all work together to provide you with the care you need, when you need it.  We recommend signing up for the patient portal called "MyChart".  Sign up information is provided on this After Visit Summary.  MyChart is used to connect with patients for Virtual Visits (Telemedicine).  Patients are able to view lab/test results, encounter notes, upcoming appointments, etc.  Non-urgent messages can be sent to your provider as well.   To learn more about what you can do with MyChart, go to [email or web address].    Your next appointment:   12 month(s)  The format for your next appointment:   In Person  Provider:   Mark Skains, MD   Thank you for choosing [facility]!!      

## 2019-09-25 ENCOUNTER — Other Ambulatory Visit: Payer: Self-pay | Admitting: Internal Medicine

## 2019-09-25 DIAGNOSIS — R1310 Dysphagia, unspecified: Secondary | ICD-10-CM

## 2019-09-29 ENCOUNTER — Inpatient Hospital Stay: Admission: RE | Admit: 2019-09-29 | Payer: Medicare Other | Source: Ambulatory Visit

## 2019-10-06 ENCOUNTER — Other Ambulatory Visit: Payer: Self-pay | Admitting: Internal Medicine

## 2019-10-06 ENCOUNTER — Ambulatory Visit
Admission: RE | Admit: 2019-10-06 | Discharge: 2019-10-06 | Disposition: A | Discharge status: Home / Self Care | Payer: Medicare Other | Source: Ambulatory Visit | Attending: Internal Medicine | Admitting: Internal Medicine

## 2019-10-06 DIAGNOSIS — R1310 Dysphagia, unspecified: Secondary | ICD-10-CM

## 2019-10-06 DIAGNOSIS — K449 Diaphragmatic hernia without obstruction or gangrene: Secondary | ICD-10-CM | POA: Diagnosis not present

## 2019-11-14 DIAGNOSIS — M48062 Spinal stenosis, lumbar region with neurogenic claudication: Secondary | ICD-10-CM | POA: Diagnosis not present

## 2019-11-14 DIAGNOSIS — M5136 Other intervertebral disc degeneration, lumbar region: Secondary | ICD-10-CM | POA: Diagnosis not present

## 2019-11-14 DIAGNOSIS — M5416 Radiculopathy, lumbar region: Secondary | ICD-10-CM | POA: Diagnosis not present

## 2019-11-14 DIAGNOSIS — Z79899 Other long term (current) drug therapy: Secondary | ICD-10-CM | POA: Diagnosis not present

## 2019-11-23 ENCOUNTER — Ambulatory Visit (INDEPENDENT_AMBULATORY_CARE_PROVIDER_SITE_OTHER): Payer: Medicare Other | Admitting: Internal Medicine

## 2019-11-23 ENCOUNTER — Other Ambulatory Visit: Payer: Self-pay

## 2019-11-23 ENCOUNTER — Encounter: Payer: Self-pay | Admitting: Internal Medicine

## 2019-11-23 VITALS — BP 156/82 | HR 65 | Ht 70.0 in | Wt 136.0 lb

## 2019-11-23 DIAGNOSIS — Z950 Presence of cardiac pacemaker: Secondary | ICD-10-CM | POA: Diagnosis not present

## 2019-11-23 DIAGNOSIS — I442 Atrioventricular block, complete: Secondary | ICD-10-CM

## 2019-11-23 DIAGNOSIS — Z952 Presence of prosthetic heart valve: Secondary | ICD-10-CM

## 2019-11-23 LAB — CUP PACEART INCLINIC DEVICE CHECK
Battery Remaining Longevity: 124 mo
Battery Voltage: 2.99 V
Brady Statistic RA Percent Paced: 44 %
Brady Statistic RV Percent Paced: 97 %
Date Time Interrogation Session: 20210826155721
Implantable Lead Implant Date: 20190304
Implantable Lead Implant Date: 20190304
Implantable Lead Location: 753859
Implantable Lead Location: 753860
Implantable Pulse Generator Implant Date: 20190304
Lead Channel Impedance Value: 375 Ohm
Lead Channel Impedance Value: 587.5 Ohm
Lead Channel Pacing Threshold Amplitude: 0.625 V
Lead Channel Pacing Threshold Amplitude: 0.75 V
Lead Channel Pacing Threshold Pulse Width: 0.5 ms
Lead Channel Pacing Threshold Pulse Width: 0.5 ms
Lead Channel Sensing Intrinsic Amplitude: 12 mV
Lead Channel Sensing Intrinsic Amplitude: 3.6 mV
Lead Channel Setting Pacing Amplitude: 0.875
Lead Channel Setting Pacing Amplitude: 1.75 V
Lead Channel Setting Pacing Pulse Width: 0.5 ms
Lead Channel Setting Sensing Sensitivity: 4 mV
Pulse Gen Model: 2272
Pulse Gen Serial Number: 8992729

## 2019-11-23 MED ORDER — FUROSEMIDE 20 MG PO TABS
20.0000 mg | ORAL_TABLET | Freq: Every day | ORAL | 3 refills | Status: DC
Start: 2019-11-23 — End: 2020-11-18

## 2019-11-23 NOTE — Patient Instructions (Addendum)
Medication Instructions:  Your physician has recommended you make the following change in your medication:  1.  Start taking furosemide (lasix) 20 mg--one tablet by mouth daily   Labwork: None ordered.  Testing/Procedures: None ordered.  Follow-Up: Your physician wants you to follow-up in: one year with Dr. Lovena Le.   You will receive a reminder letter in the mail two months in advance. If you don't receive a letter, please call our office to schedule the follow-up appointment.  Remote monitoring is used to monitor your Pacemaker from home. This monitoring reduces the number of office visits required to check your device to one time per year. It allows [place] to keep an eye on the functioning of your device to ensure it is working properly. You are scheduled for a device check from home on 11/28/2019. You may send your transmission at any time that day. If you have a wireless device, the transmission will be sent automatically. After your physician reviews your transmission, you will receive a postcard with your next transmission date.  Any Other Special Instructions Will Be Listed Below (If Applicable).  If you need a refill on your cardiac medications before your next appointment, please call your pharmacy.

## 2019-11-23 NOTE — Progress Notes (Signed)
HPI [name] returns today for PPM followup. He is a pleasant elderly man who developed CHB, and underwent emergent PPM approx 2.5 years ago. He has developed worsening back pain and sob and peripheral edema.  No chest pain or syncope. He notes dyspnea with exertion. His appetite has been down. Allergies  Allergen Reactions  . Aleve [Naproxen Sodium] Hives  . Codeine Other (See Comments)    unknown  . Penicillin G Benzathine     Pt doesn't remember reaction     Current Outpatient Medications  Medication Sig Dispense Refill  . acetaminophen (TYLENOL 8 HOUR) 650 MG CR tablet Take 1,300 mg by mouth every 8 (eight) hours as needed for pain.    [name] albuterol (PROVENTIL HFA;VENTOLIN HFA) 108 (90 BASE) MCG/ACT inhaler Inhale 2 puffs into the lungs every 6 (six) hours as needed for wheezing or shortness of breath.    [name] aspirin EC 81 MG tablet Take 1 tablet (81 mg total) by mouth daily. 90 tablet 3  . atorvastatin (LIPITOR) 10 MG tablet Take 10 mg by mouth daily at 6 PM.     . HYDROcodone-acetaminophen (NORCO) 7.5-325 MG tablet Take 0.5-1 tablets by mouth 2 (two) times daily as needed.    [name] levothyroxine (SYNTHROID, LEVOTHROID) 88 MCG tablet Take 88 mcg by mouth daily before breakfast.    . mirtazapine (REMERON) 15 MG tablet Take 1 tablet by mouth daily.    . saw palmetto 500 MG capsule Take 500 mg by mouth daily.     No current facility-administered medications for this visit.     Past Medical History:  Diagnosis Date  . Allergic rhinitis    ,RAD wheezing  . Cataracts, bilateral   . Complication of anesthesia    hallucinations after aaa surgery and valve replacement  . Diverticulosis    , ACBE, flex, in 2003  . Dyslipidemia   . Fracture of left clavicle    , Hx  . Hearing loss    , Bilateral  . History of kidney stones    h/o  . Hypotension 11/2018  . Hypothyroidism   . Insomnia   . OA (optic atrophy)   . Osteoarthritis   . Pelvic fracture (HCC)    , Hx  . RBBB  (right bundle branch block)    , seen on EKG  . Varicose veins     ROS:   All systems reviewed and negative except as noted in the HPI.   Past Surgical History:  Procedure Laterality Date  . ABDOMINAL AORTOGRAM N/A 06/22/2016   Procedure: Abdominal Aortogram;  Surgeon: Katha Cabal, MD;  Location: [place] [facility];  Service: Cardiovascular;  Laterality: N/A;  . AORTIC INTERVENTION N/A 06/22/2016   Procedure: Aortic Intervention;  Surgeon: Katha Cabal, MD;  Location: [place] [facility];  Service: Cardiovascular;  Laterality: N/A;  . AORTIC VALVE REPLACEMENT  2011   COW VALVE  . CATARACT EXTRACTION W/PHACO Right 02/17/2016   Procedure: CATARACT EXTRACTION PHACO AND INTRAOCULAR LENS PLACEMENT (IOC);  Surgeon: Ronnell Freshwater, MD;  Location: [place];  Service: Ophthalmology;  Laterality: Right;  RIGHT  . INGUINAL HERNIA REPAIR Bilateral   . KYPHOPLASTY N/A 06/29/2019   Procedure: T12 KYPHOPLASTY;  Surgeon: Hessie Knows, MD;  Location: [facility];  Service: Orthopedics;  Laterality: N/A;  . KYPHOPLASTY N/A 07/11/2019   Procedure: T11 KYPHOPLASTY;  Surgeon: Hessie Knows, MD;  Location: [facility];  Service: Orthopedics;  Laterality: N/A;  . KYPHOPLASTY  N/A 07/25/2019   Procedure: L1 KYPHOPLASTY;  Surgeon: Hessie Knows, MD;  Location: [facility];  Service: Orthopedics;  Laterality: N/A;  . Orchectomy Right   . PACEMAKER IMPLANT N/A 05/31/2017   Procedure: PACEMAKER IMPLANT;  Surgeon: Evans Lance, MD;  Location: [place] [facility];  Service: Cardiovascular;  Laterality: N/A;  . TEMPORARY PACEMAKER N/A 05/31/2017   Procedure: TEMPORARY PACEMAKER;  Surgeon: Sherren Mocha, MD;  Location: [place] [facility];  Service: Cardiovascular;  Laterality: N/A;  . THYROIDECTOMY       Family History  Problem Relation Age of Onset  . Diabetes Mellitus II Mother      Social History   Socioeconomic History  . Marital status: Married    Spouse name: Not on file  .  Number of children: Not on file  . Years of education: Not on file  . Highest education level: Not on file  Occupational History  . Not on file  Tobacco Use  . Smoking status: Former Smoker    Packs/day: 0.50    Years: 30.00    Pack years: 15.00    Types: Cigarettes    Quit date: 03/30/1965    Years since quitting: 54.6  . Smokeless tobacco: Current User    Types: Chew  Vaping Use  . Vaping Use: Never used  Substance and Sexual Activity  . Alcohol use: No  . Drug use: No  . Sexual activity: Not on file  Other Topics Concern  . Not on file  Social History Narrative  . Not on file   Social Determinants of Health   Financial Resource Strain:   . Difficulty of Paying Living Expenses: Not on file  Food Insecurity:   . Worried About Charity fundraiser in the Last Year: Not on file  . Ran Out of Food in the Last Year: Not on file  Transportation Needs:   . Lack of Transportation (Medical): Not on file  . Lack of Transportation (Non-Medical): Not on file  Physical Activity:   . Days of Exercise per Week: Not on file  . Minutes of Exercise per Session: Not on file  Stress:   . Feeling of Stress : Not on file  Social Connections:   . Frequency of Communication with Friends and Family: Not on file  . Frequency of Social Gatherings with Friends and Family: Not on file  . Attends Religious Services: Not on file  . Active Member of Clubs or Organizations: Not on file  . Attends Archivist Meetings: Not on file  . Marital Status: Not on file  Intimate Partner Violence:   . Fear of Current or Ex-Partner: Not on file  . Emotionally Abused: Not on file  . Physically Abused: Not on file  . Sexually Abused: Not on file     BP (!) 156/82   Pulse 65   Ht 5\' 10"  (1.778 m)   Wt 136 lb (61.7 kg)   SpO2 97%   BMI 19.51 kg/m   Physical Exam:  Well appearing NAD HEENT: Unremarkable Neck:  No JVD, no thyromegally Lymphatics:  No adenopathy Back:  No CVA  tenderness Lungs:  Clear with no wheezes HEART:  Regular rate rhythm, no murmurs, no rubs, no clicks Abd:  soft, positive bowel sounds, no organomegally, no rebound, no guarding Ext:  2 plus pulses, no edema, no cyanosis, no clubbing Skin:  No rashes no nodules Neuro:  CN II through XII intact, motor grossly intact  EKG - nsr  with ventricular pacing  DEVICE  Normal device function.  See PaceArt for details.   Assess/Plan: 1. CHB - he is asymptomatic, s/p PPM insertion. 2. PPM - his [employer] Jude DDD PPM is working normally. 3. AS - he is s/p AVR.  4. Peripheral edema/sob - I have prescribed him lasix 20 mg daily.  Jay Overlie Sebastian Dzik,MD

## 2019-11-28 ENCOUNTER — Ambulatory Visit (INDEPENDENT_AMBULATORY_CARE_PROVIDER_SITE_OTHER): Payer: Medicare Other | Admitting: *Deleted

## 2019-11-28 DIAGNOSIS — I442 Atrioventricular block, complete: Secondary | ICD-10-CM

## 2019-11-28 LAB — CUP PACEART REMOTE DEVICE CHECK
Battery Remaining Longevity: 129 mo
Battery Remaining Percentage: 95.5 %
Battery Voltage: 2.99 V
Brady Statistic AP VP Percent: 24 %
Brady Statistic AP VS Percent: 1 %
Brady Statistic AS VP Percent: 75 %
Brady Statistic AS VS Percent: 1 %
Brady Statistic RA Percent Paced: 21 %
Brady Statistic RV Percent Paced: 99 %
Date Time Interrogation Session: 20210831020015
Implantable Lead Implant Date: 20190304
Implantable Lead Implant Date: 20190304
Implantable Lead Location: 753859
Implantable Lead Location: 753860
Implantable Pulse Generator Implant Date: 20190304
Lead Channel Impedance Value: 390 Ohm
Lead Channel Impedance Value: 680 Ohm
Lead Channel Pacing Threshold Amplitude: 0.625 V
Lead Channel Pacing Threshold Amplitude: 0.75 V
Lead Channel Pacing Threshold Pulse Width: 0.5 ms
Lead Channel Pacing Threshold Pulse Width: 0.5 ms
Lead Channel Sensing Intrinsic Amplitude: 2.9 mV
Lead Channel Sensing Intrinsic Amplitude: 8.6 mV
Lead Channel Setting Pacing Amplitude: 0.875
Lead Channel Setting Pacing Amplitude: 1.75 V
Lead Channel Setting Pacing Pulse Width: 0.5 ms
Lead Channel Setting Sensing Sensitivity: 4 mV
Pulse Gen Model: 2272
Pulse Gen Serial Number: 8992729

## 2019-11-28 NOTE — Addendum Note (Signed)
Addended by: Rose Phi on: 11/28/2019 11:15 AM   Modules accepted: Orders

## 2019-11-29 NOTE — Progress Notes (Signed)
Remote pacemaker transmission.   

## 2019-12-13 DIAGNOSIS — M48062 Spinal stenosis, lumbar region with neurogenic claudication: Secondary | ICD-10-CM | POA: Diagnosis not present

## 2019-12-13 DIAGNOSIS — M5136 Other intervertebral disc degeneration, lumbar region: Secondary | ICD-10-CM | POA: Diagnosis not present

## 2019-12-13 DIAGNOSIS — M5416 Radiculopathy, lumbar region: Secondary | ICD-10-CM | POA: Diagnosis not present

## 2020-01-23 DIAGNOSIS — M5136 Other intervertebral disc degeneration, lumbar region: Secondary | ICD-10-CM | POA: Diagnosis not present

## 2020-01-23 DIAGNOSIS — M5416 Radiculopathy, lumbar region: Secondary | ICD-10-CM | POA: Diagnosis not present

## 2020-01-23 DIAGNOSIS — M48062 Spinal stenosis, lumbar region with neurogenic claudication: Secondary | ICD-10-CM | POA: Diagnosis not present

## 2020-01-24 DIAGNOSIS — R300 Dysuria: Secondary | ICD-10-CM | POA: Diagnosis not present

## 2020-01-24 DIAGNOSIS — R058 Other specified cough: Secondary | ICD-10-CM | POA: Diagnosis not present

## 2020-01-24 DIAGNOSIS — Z23 Encounter for immunization: Secondary | ICD-10-CM | POA: Diagnosis not present

## 2020-02-13 DIAGNOSIS — Z79891 Long term (current) use of opiate analgesic: Secondary | ICD-10-CM | POA: Diagnosis not present

## 2020-02-13 DIAGNOSIS — M48062 Spinal stenosis, lumbar region with neurogenic claudication: Secondary | ICD-10-CM | POA: Diagnosis not present

## 2020-02-13 DIAGNOSIS — K469 Unspecified abdominal hernia without obstruction or gangrene: Secondary | ICD-10-CM | POA: Diagnosis not present

## 2020-02-13 DIAGNOSIS — Z9181 History of falling: Secondary | ICD-10-CM | POA: Diagnosis not present

## 2020-02-13 DIAGNOSIS — H269 Unspecified cataract: Secondary | ICD-10-CM | POA: Diagnosis not present

## 2020-02-13 DIAGNOSIS — M5116 Intervertebral disc disorders with radiculopathy, lumbar region: Secondary | ICD-10-CM | POA: Diagnosis not present

## 2020-02-13 DIAGNOSIS — M5136 Other intervertebral disc degeneration, lumbar region: Secondary | ICD-10-CM | POA: Diagnosis not present

## 2020-02-13 DIAGNOSIS — I729 Aneurysm of unspecified site: Secondary | ICD-10-CM | POA: Diagnosis not present

## 2020-02-13 DIAGNOSIS — Z950 Presence of cardiac pacemaker: Secondary | ICD-10-CM | POA: Diagnosis not present

## 2020-02-18 DIAGNOSIS — Z23 Encounter for immunization: Secondary | ICD-10-CM | POA: Diagnosis not present

## 2020-02-19 DIAGNOSIS — Z79891 Long term (current) use of opiate analgesic: Secondary | ICD-10-CM | POA: Diagnosis not present

## 2020-02-19 DIAGNOSIS — M5116 Intervertebral disc disorders with radiculopathy, lumbar region: Secondary | ICD-10-CM | POA: Diagnosis not present

## 2020-02-19 DIAGNOSIS — K469 Unspecified abdominal hernia without obstruction or gangrene: Secondary | ICD-10-CM | POA: Diagnosis not present

## 2020-02-19 DIAGNOSIS — I729 Aneurysm of unspecified site: Secondary | ICD-10-CM | POA: Diagnosis not present

## 2020-02-19 DIAGNOSIS — H269 Unspecified cataract: Secondary | ICD-10-CM | POA: Diagnosis not present

## 2020-02-19 DIAGNOSIS — M48062 Spinal stenosis, lumbar region with neurogenic claudication: Secondary | ICD-10-CM | POA: Diagnosis not present

## 2020-02-21 DIAGNOSIS — M5116 Intervertebral disc disorders with radiculopathy, lumbar region: Secondary | ICD-10-CM | POA: Diagnosis not present

## 2020-02-21 DIAGNOSIS — H269 Unspecified cataract: Secondary | ICD-10-CM | POA: Diagnosis not present

## 2020-02-21 DIAGNOSIS — Z79891 Long term (current) use of opiate analgesic: Secondary | ICD-10-CM | POA: Diagnosis not present

## 2020-02-21 DIAGNOSIS — K469 Unspecified abdominal hernia without obstruction or gangrene: Secondary | ICD-10-CM | POA: Diagnosis not present

## 2020-02-21 DIAGNOSIS — I729 Aneurysm of unspecified site: Secondary | ICD-10-CM | POA: Diagnosis not present

## 2020-02-21 DIAGNOSIS — M48062 Spinal stenosis, lumbar region with neurogenic claudication: Secondary | ICD-10-CM | POA: Diagnosis not present

## 2020-02-27 ENCOUNTER — Ambulatory Visit (INDEPENDENT_AMBULATORY_CARE_PROVIDER_SITE_OTHER): Payer: Medicare Other

## 2020-02-27 DIAGNOSIS — I441 Atrioventricular block, second degree: Secondary | ICD-10-CM

## 2020-02-27 LAB — CUP PACEART REMOTE DEVICE CHECK
Battery Remaining Longevity: 125 mo
Battery Remaining Percentage: 95.5 %
Battery Voltage: 2.99 V
Brady Statistic AP VP Percent: 30 %
Brady Statistic AP VS Percent: 1 %
Brady Statistic AS VP Percent: 67 %
Brady Statistic AS VS Percent: 1 %
Brady Statistic RA Percent Paced: 27 %
Brady Statistic RV Percent Paced: 97 %
Date Time Interrogation Session: 20211130020023
Implantable Lead Implant Date: 20190304
Implantable Lead Implant Date: 20190304
Implantable Lead Location: 753859
Implantable Lead Location: 753860
Implantable Pulse Generator Implant Date: 20190304
Lead Channel Impedance Value: 390 Ohm
Lead Channel Impedance Value: 560 Ohm
Lead Channel Pacing Threshold Amplitude: 0.625 V
Lead Channel Pacing Threshold Amplitude: 0.75 V
Lead Channel Pacing Threshold Pulse Width: 0.5 ms
Lead Channel Pacing Threshold Pulse Width: 0.5 ms
Lead Channel Sensing Intrinsic Amplitude: 12 mV
Lead Channel Sensing Intrinsic Amplitude: 3 mV
Lead Channel Setting Pacing Amplitude: 1 V
Lead Channel Setting Pacing Amplitude: 1.625
Lead Channel Setting Pacing Pulse Width: 0.5 ms
Lead Channel Setting Sensing Sensitivity: 4 mV
Pulse Gen Model: 2272
Pulse Gen Serial Number: 8992729

## 2020-02-28 DIAGNOSIS — H269 Unspecified cataract: Secondary | ICD-10-CM | POA: Diagnosis not present

## 2020-02-28 DIAGNOSIS — K469 Unspecified abdominal hernia without obstruction or gangrene: Secondary | ICD-10-CM | POA: Diagnosis not present

## 2020-02-28 DIAGNOSIS — I729 Aneurysm of unspecified site: Secondary | ICD-10-CM | POA: Diagnosis not present

## 2020-02-28 DIAGNOSIS — Z79891 Long term (current) use of opiate analgesic: Secondary | ICD-10-CM | POA: Diagnosis not present

## 2020-02-28 DIAGNOSIS — M5116 Intervertebral disc disorders with radiculopathy, lumbar region: Secondary | ICD-10-CM | POA: Diagnosis not present

## 2020-02-28 DIAGNOSIS — M48062 Spinal stenosis, lumbar region with neurogenic claudication: Secondary | ICD-10-CM | POA: Diagnosis not present

## 2020-03-01 DIAGNOSIS — K469 Unspecified abdominal hernia without obstruction or gangrene: Secondary | ICD-10-CM | POA: Diagnosis not present

## 2020-03-01 DIAGNOSIS — M48062 Spinal stenosis, lumbar region with neurogenic claudication: Secondary | ICD-10-CM | POA: Diagnosis not present

## 2020-03-01 DIAGNOSIS — M5116 Intervertebral disc disorders with radiculopathy, lumbar region: Secondary | ICD-10-CM | POA: Diagnosis not present

## 2020-03-01 DIAGNOSIS — I729 Aneurysm of unspecified site: Secondary | ICD-10-CM | POA: Diagnosis not present

## 2020-03-01 DIAGNOSIS — Z79891 Long term (current) use of opiate analgesic: Secondary | ICD-10-CM | POA: Diagnosis not present

## 2020-03-01 DIAGNOSIS — H269 Unspecified cataract: Secondary | ICD-10-CM | POA: Diagnosis not present

## 2020-03-04 NOTE — Progress Notes (Signed)
Remote pacemaker transmission.   

## 2020-03-05 DIAGNOSIS — I729 Aneurysm of unspecified site: Secondary | ICD-10-CM | POA: Diagnosis not present

## 2020-03-05 DIAGNOSIS — M5116 Intervertebral disc disorders with radiculopathy, lumbar region: Secondary | ICD-10-CM | POA: Diagnosis not present

## 2020-03-05 DIAGNOSIS — H269 Unspecified cataract: Secondary | ICD-10-CM | POA: Diagnosis not present

## 2020-03-05 DIAGNOSIS — Z79891 Long term (current) use of opiate analgesic: Secondary | ICD-10-CM | POA: Diagnosis not present

## 2020-03-05 DIAGNOSIS — K469 Unspecified abdominal hernia without obstruction or gangrene: Secondary | ICD-10-CM | POA: Diagnosis not present

## 2020-03-05 DIAGNOSIS — M48062 Spinal stenosis, lumbar region with neurogenic claudication: Secondary | ICD-10-CM | POA: Diagnosis not present

## 2020-03-06 DIAGNOSIS — M47816 Spondylosis without myelopathy or radiculopathy, lumbar region: Secondary | ICD-10-CM | POA: Diagnosis not present

## 2020-03-06 DIAGNOSIS — Z79899 Other long term (current) drug therapy: Secondary | ICD-10-CM | POA: Diagnosis not present

## 2020-03-06 DIAGNOSIS — M5136 Other intervertebral disc degeneration, lumbar region: Secondary | ICD-10-CM | POA: Diagnosis not present

## 2020-03-06 DIAGNOSIS — M5416 Radiculopathy, lumbar region: Secondary | ICD-10-CM | POA: Diagnosis not present

## 2020-03-06 DIAGNOSIS — M48062 Spinal stenosis, lumbar region with neurogenic claudication: Secondary | ICD-10-CM | POA: Diagnosis not present

## 2020-03-08 DIAGNOSIS — I729 Aneurysm of unspecified site: Secondary | ICD-10-CM | POA: Diagnosis not present

## 2020-03-08 DIAGNOSIS — M48062 Spinal stenosis, lumbar region with neurogenic claudication: Secondary | ICD-10-CM | POA: Diagnosis not present

## 2020-03-08 DIAGNOSIS — K469 Unspecified abdominal hernia without obstruction or gangrene: Secondary | ICD-10-CM | POA: Diagnosis not present

## 2020-03-08 DIAGNOSIS — Z79891 Long term (current) use of opiate analgesic: Secondary | ICD-10-CM | POA: Diagnosis not present

## 2020-03-08 DIAGNOSIS — H269 Unspecified cataract: Secondary | ICD-10-CM | POA: Diagnosis not present

## 2020-03-08 DIAGNOSIS — M5116 Intervertebral disc disorders with radiculopathy, lumbar region: Secondary | ICD-10-CM | POA: Diagnosis not present

## 2020-03-11 DIAGNOSIS — K469 Unspecified abdominal hernia without obstruction or gangrene: Secondary | ICD-10-CM | POA: Diagnosis not present

## 2020-03-11 DIAGNOSIS — M48062 Spinal stenosis, lumbar region with neurogenic claudication: Secondary | ICD-10-CM | POA: Diagnosis not present

## 2020-03-11 DIAGNOSIS — Z79891 Long term (current) use of opiate analgesic: Secondary | ICD-10-CM | POA: Diagnosis not present

## 2020-03-11 DIAGNOSIS — M47816 Spondylosis without myelopathy or radiculopathy, lumbar region: Secondary | ICD-10-CM | POA: Diagnosis not present

## 2020-03-11 DIAGNOSIS — I729 Aneurysm of unspecified site: Secondary | ICD-10-CM | POA: Diagnosis not present

## 2020-03-11 DIAGNOSIS — H269 Unspecified cataract: Secondary | ICD-10-CM | POA: Diagnosis not present

## 2020-03-11 DIAGNOSIS — M5116 Intervertebral disc disorders with radiculopathy, lumbar region: Secondary | ICD-10-CM | POA: Diagnosis not present

## 2020-03-12 DIAGNOSIS — I729 Aneurysm of unspecified site: Secondary | ICD-10-CM | POA: Diagnosis not present

## 2020-03-12 DIAGNOSIS — Z79891 Long term (current) use of opiate analgesic: Secondary | ICD-10-CM | POA: Diagnosis not present

## 2020-03-12 DIAGNOSIS — H269 Unspecified cataract: Secondary | ICD-10-CM | POA: Diagnosis not present

## 2020-03-12 DIAGNOSIS — K469 Unspecified abdominal hernia without obstruction or gangrene: Secondary | ICD-10-CM | POA: Diagnosis not present

## 2020-03-12 DIAGNOSIS — M48062 Spinal stenosis, lumbar region with neurogenic claudication: Secondary | ICD-10-CM | POA: Diagnosis not present

## 2020-03-12 DIAGNOSIS — M5116 Intervertebral disc disorders with radiculopathy, lumbar region: Secondary | ICD-10-CM | POA: Diagnosis not present

## 2020-03-14 DIAGNOSIS — M5136 Other intervertebral disc degeneration, lumbar region: Secondary | ICD-10-CM | POA: Diagnosis not present

## 2020-03-15 DIAGNOSIS — Z961 Presence of intraocular lens: Secondary | ICD-10-CM | POA: Diagnosis not present

## 2020-03-26 DIAGNOSIS — M47816 Spondylosis without myelopathy or radiculopathy, lumbar region: Secondary | ICD-10-CM | POA: Diagnosis not present

## 2020-04-01 ENCOUNTER — Other Ambulatory Visit (INDEPENDENT_AMBULATORY_CARE_PROVIDER_SITE_OTHER): Payer: Self-pay | Admitting: Nurse Practitioner

## 2020-04-01 DIAGNOSIS — I714 Abdominal aortic aneurysm, without rupture, unspecified: Secondary | ICD-10-CM

## 2020-04-04 ENCOUNTER — Ambulatory Visit (INDEPENDENT_AMBULATORY_CARE_PROVIDER_SITE_OTHER): Payer: Medicare Other | Admitting: Nurse Practitioner

## 2020-04-04 ENCOUNTER — Ambulatory Visit (INDEPENDENT_AMBULATORY_CARE_PROVIDER_SITE_OTHER): Payer: Medicare Other

## 2020-04-04 ENCOUNTER — Other Ambulatory Visit: Payer: Self-pay

## 2020-04-04 DIAGNOSIS — I714 Abdominal aortic aneurysm, without rupture, unspecified: Secondary | ICD-10-CM

## 2020-04-12 ENCOUNTER — Encounter (INDEPENDENT_AMBULATORY_CARE_PROVIDER_SITE_OTHER): Payer: Self-pay | Admitting: *Deleted

## 2020-04-26 DIAGNOSIS — M47816 Spondylosis without myelopathy or radiculopathy, lumbar region: Secondary | ICD-10-CM | POA: Diagnosis not present

## 2020-05-01 DIAGNOSIS — M47816 Spondylosis without myelopathy or radiculopathy, lumbar region: Secondary | ICD-10-CM | POA: Diagnosis not present

## 2020-05-01 DIAGNOSIS — Z79899 Other long term (current) drug therapy: Secondary | ICD-10-CM | POA: Diagnosis not present

## 2020-05-01 DIAGNOSIS — M5416 Radiculopathy, lumbar region: Secondary | ICD-10-CM | POA: Diagnosis not present

## 2020-05-01 DIAGNOSIS — M48062 Spinal stenosis, lumbar region with neurogenic claudication: Secondary | ICD-10-CM | POA: Diagnosis not present

## 2020-05-01 DIAGNOSIS — M5136 Other intervertebral disc degeneration, lumbar region: Secondary | ICD-10-CM | POA: Diagnosis not present

## 2020-05-14 DIAGNOSIS — M48062 Spinal stenosis, lumbar region with neurogenic claudication: Secondary | ICD-10-CM | POA: Diagnosis not present

## 2020-05-14 DIAGNOSIS — M5416 Radiculopathy, lumbar region: Secondary | ICD-10-CM | POA: Diagnosis not present

## 2020-05-14 DIAGNOSIS — M5136 Other intervertebral disc degeneration, lumbar region: Secondary | ICD-10-CM | POA: Diagnosis not present

## 2020-05-27 ENCOUNTER — Telehealth: Payer: Self-pay

## 2020-05-27 LAB — CUP PACEART REMOTE DEVICE CHECK
Battery Remaining Longevity: 125 mo
Battery Remaining Percentage: 95.5 %
Battery Voltage: 2.99 V
Brady Statistic AP VP Percent: 42 %
Brady Statistic AP VS Percent: 1 %
Brady Statistic AS VP Percent: 54 %
Brady Statistic AS VS Percent: 1 %
Brady Statistic RA Percent Paced: 37 %
Brady Statistic RV Percent Paced: 96 %
Date Time Interrogation Session: 20220228020013
Implantable Lead Implant Date: 20190304
Implantable Lead Implant Date: 20190304
Implantable Lead Location: 753859
Implantable Lead Location: 753860
Implantable Pulse Generator Implant Date: 20190304
Lead Channel Impedance Value: 400 Ohm
Lead Channel Impedance Value: 640 Ohm
Lead Channel Pacing Threshold Amplitude: 0.5 V
Lead Channel Pacing Threshold Amplitude: 0.625 V
Lead Channel Pacing Threshold Pulse Width: 0.5 ms
Lead Channel Pacing Threshold Pulse Width: 0.5 ms
Lead Channel Sensing Intrinsic Amplitude: 12 mV
Lead Channel Sensing Intrinsic Amplitude: 2.6 mV
Lead Channel Setting Pacing Amplitude: 0.75 V
Lead Channel Setting Pacing Amplitude: 1.625
Lead Channel Setting Pacing Pulse Width: 0.5 ms
Lead Channel Setting Sensing Sensitivity: 4 mV
Pulse Gen Model: 2272
Pulse Gen Serial Number: 8992729

## 2020-05-27 NOTE — Telephone Encounter (Signed)
Scheduled remote received 05/27/20. [facility] alert received for new onset of AF. Spoke to daughter (patient gave verbal permission), patient reports of shortness of breath this morning and last night also with increased fatigue. Does reports of increased swelling legs, took Lasix 20 mg daily, reports minimal relief. Daughter reports patient has hx of frequent falls 6 months ago d/t low blood pressure, but none recently. Routing to Dr. Lovena Le to see about [facility] or AF clinic.

## 2020-05-28 ENCOUNTER — Ambulatory Visit (INDEPENDENT_AMBULATORY_CARE_PROVIDER_SITE_OTHER): Payer: Medicare Other

## 2020-05-28 DIAGNOSIS — I442 Atrioventricular block, complete: Secondary | ICD-10-CM

## 2020-05-28 NOTE — Telephone Encounter (Signed)
Refer to atrial fib clinic

## 2020-05-30 NOTE — Telephone Encounter (Signed)
Spoke to [employer] (daughter) to advise Dr. Lovena Le wants patient to follow up at AF clinic. Advised I will send referral and someone from the AF clinic will call to discuss setting up apt. Thankful for call.

## 2020-05-30 NOTE — Telephone Encounter (Signed)
appt made

## 2020-05-31 ENCOUNTER — Encounter (HOSPITAL_COMMUNITY): Payer: Self-pay | Admitting: Physician Assistant

## 2020-05-31 ENCOUNTER — Other Ambulatory Visit: Payer: Self-pay

## 2020-05-31 ENCOUNTER — Ambulatory Visit (HOSPITAL_COMMUNITY)
Admission: RE | Admit: 2020-05-31 | Discharge: 2020-05-31 | Disposition: A | Discharge status: Home / Self Care | Payer: Medicare Other | Source: Ambulatory Visit | Attending: Physician Assistant | Admitting: Physician Assistant

## 2020-05-31 VITALS — BP 152/90 | HR 60 | Ht 70.0 in | Wt 138.8 lb

## 2020-05-31 DIAGNOSIS — Z950 Presence of cardiac pacemaker: Secondary | ICD-10-CM | POA: Insufficient documentation

## 2020-05-31 DIAGNOSIS — Z79890 Hormone replacement therapy (postmenopausal): Secondary | ICD-10-CM | POA: Insufficient documentation

## 2020-05-31 DIAGNOSIS — Z87891 Personal history of nicotine dependence: Secondary | ICD-10-CM | POA: Insufficient documentation

## 2020-05-31 DIAGNOSIS — D6869 Other thrombophilia: Secondary | ICD-10-CM

## 2020-05-31 DIAGNOSIS — Z880 Allergy status to penicillin: Secondary | ICD-10-CM | POA: Diagnosis not present

## 2020-05-31 DIAGNOSIS — Z886 Allergy status to analgesic agent status: Secondary | ICD-10-CM | POA: Diagnosis not present

## 2020-05-31 DIAGNOSIS — Z953 Presence of xenogenic heart valve: Secondary | ICD-10-CM | POA: Diagnosis not present

## 2020-05-31 DIAGNOSIS — Z7901 Long term (current) use of anticoagulants: Secondary | ICD-10-CM | POA: Diagnosis not present

## 2020-05-31 DIAGNOSIS — I4819 Other persistent atrial fibrillation: Secondary | ICD-10-CM

## 2020-05-31 DIAGNOSIS — Z79899 Other long term (current) drug therapy: Secondary | ICD-10-CM | POA: Diagnosis not present

## 2020-05-31 DIAGNOSIS — I10 Essential (primary) hypertension: Secondary | ICD-10-CM | POA: Insufficient documentation

## 2020-05-31 DIAGNOSIS — Z7982 Long term (current) use of aspirin: Secondary | ICD-10-CM | POA: Diagnosis not present

## 2020-05-31 LAB — CBC
HCT: 40.9 % (ref 39.0–52.0)
Hemoglobin: 12.5 g/dL — ABNORMAL LOW (ref 13.0–17.0)
MCH: 28.5 pg (ref 26.0–34.0)
MCHC: 30.6 g/dL (ref 30.0–36.0)
MCV: 93.4 fL (ref 80.0–100.0)
Platelets: 137 10*3/uL — ABNORMAL LOW (ref 150–400)
RBC: 4.38 MIL/uL (ref 4.22–5.81)
RDW: 14.7 % (ref 11.5–15.5)
WBC: 5.3 10*3/uL (ref 4.0–10.5)
nRBC: 0 % (ref 0.0–0.2)

## 2020-05-31 LAB — BASIC METABOLIC PANEL
Anion gap: 9 (ref 5–15)
BUN: 22 mg/dL (ref 8–23)
CO2: 26 mmol/L (ref 22–32)
Calcium: 8.6 mg/dL — ABNORMAL LOW (ref 8.9–10.3)
Chloride: 104 mmol/L (ref 98–111)
Creatinine, Ser: 1.36 mg/dL — ABNORMAL HIGH (ref 0.61–1.24)
GFR, Estimated: 49 mL/min — ABNORMAL LOW (ref >60)
Glucose, Bld: 107 mg/dL — ABNORMAL HIGH (ref 70–99)
Potassium: 4.3 mmol/L (ref 3.5–5.1)
Sodium: 139 mmol/L (ref 135–145)

## 2020-05-31 MED ORDER — APIXABAN 5 MG PO TABS
5.0000 mg | ORAL_TABLET | Freq: Two times a day (BID) | ORAL | 3 refills | Status: DC
Start: 2020-05-31 — End: 2020-06-20

## 2020-05-31 NOTE — Progress Notes (Addendum)
Primary Care Physician: Wenda Low, MD Primary Cardiologist: Dr Marlou Porch Primary Electrophysiologist: Dr Lovena Le Referring Physician: Dr Toney Sang is a 87 y.o. male with a history of CHB s/p PPM, AVR, HTN, and atrial fibrillation who presents for follow up in the [facility]. The patient was initially diagnosed with atrial fibrillation 05/27/20 after a scheduled remote device transmission showing an ongoing episode. Patient had symptoms of increased fatigue and SOB that day but he admits that these symptoms occurred intermittently prior to this. Patient has a CHADS2VASC score of 4. He remains in rate controlled afib today. It is unclear if he is symptomatic with his afib as he feels well today. There are no specific triggers that he can identify.   Today, he denies symptoms of palpitations, chest pain, shortness of breath, orthopnea, PND, lower extremity edema, dizziness, presyncope, syncope, snoring, daytime somnolence, bleeding, or neurologic sequela. The patient is tolerating medications without difficulties and is otherwise without complaint today.    Atrial Fibrillation Risk Factors:  he does not have symptoms or diagnosis of sleep apnea. he does not have a history of rheumatic fever.   he has a BMI of Body mass index is 19.92 kg/m.[name] Filed Weights   05/31/20 1353  Weight: 63 kg    Family History  Problem Relation Age of Onset  . Diabetes Mellitus II Mother      Atrial Fibrillation Management history:  Previous antiarrhythmic drugs: none Previous cardioversions: none Previous ablations: none CHADS2VASC score: 4 Anticoagulation history: none   Past Medical History:  Diagnosis Date  . Allergic rhinitis    ,RAD wheezing  . Cataracts, bilateral   . Complication of anesthesia    hallucinations after aaa surgery and valve replacement  . Diverticulosis    , ACBE, flex, in 2003  . Dyslipidemia   . Fracture of left clavicle     , Hx  . Hearing loss    , Bilateral  . History of kidney stones    h/o  . Hypotension 11/2018  . Hypothyroidism   . Insomnia   . OA (optic atrophy)   . Osteoarthritis   . Pelvic fracture (HCC)    , Hx  . RBBB (right bundle branch block)    , seen on EKG  . Varicose veins    Past Surgical History:  Procedure Laterality Date  . ABDOMINAL AORTOGRAM N/A 06/22/2016   Procedure: Abdominal Aortogram;  Surgeon: Katha Cabal, MD;  Location: [facility];  Service: Cardiovascular;  Laterality: N/A;  . AORTIC INTERVENTION N/A 06/22/2016   Procedure: Aortic Intervention;  Surgeon: Katha Cabal, MD;  Location: [facility];  Service: Cardiovascular;  Laterality: N/A;  . AORTIC VALVE REPLACEMENT  2011   COW VALVE  . CATARACT EXTRACTION W/PHACO Right 02/17/2016   Procedure: CATARACT EXTRACTION PHACO AND INTRAOCULAR LENS PLACEMENT (IOC);  Surgeon: Ronnell Freshwater, MD;  Location: [facility];  Service: Ophthalmology;  Laterality: Right;  RIGHT  . INGUINAL HERNIA REPAIR Bilateral   . KYPHOPLASTY N/A 06/29/2019   Procedure: T12 KYPHOPLASTY;  Surgeon: Hessie Knows, MD;  Location: [facility];  Service: Orthopedics;  Laterality: N/A;  . KYPHOPLASTY N/A 07/11/2019   Procedure: T11 KYPHOPLASTY;  Surgeon: Hessie Knows, MD;  Location: [facility];  Service: Orthopedics;  Laterality: N/A;  . KYPHOPLASTY N/A 07/25/2019   Procedure: L1 KYPHOPLASTY;  Surgeon: Hessie Knows, MD;  Location: [facility];  Service: Orthopedics;  Laterality: N/A;  . Orchectomy  Right   . PACEMAKER IMPLANT N/A 05/31/2017   Procedure: PACEMAKER IMPLANT;  Surgeon: Evans Lance, MD;  Location: [facility];  Service: Cardiovascular;  Laterality: N/A;  . TEMPORARY PACEMAKER N/A 05/31/2017   Procedure: TEMPORARY PACEMAKER;  Surgeon: Sherren Mocha, MD;  Location: [facility];  Service: Cardiovascular;  Laterality: N/A;  . THYROIDECTOMY      Current Outpatient Medications  Medication Sig  Dispense Refill  . acetaminophen (TYLENOL) 650 MG CR tablet Take 1,300 mg by mouth every 8 (eight) hours as needed for pain.    [name] albuterol (PROVENTIL HFA;VENTOLIN HFA) 108 (90 BASE) MCG/ACT inhaler Inhale 2 puffs into the lungs every 6 (six) hours as needed for wheezing or shortness of breath.    [name] apixaban (ELIQUIS) 5 MG TABS tablet Take 1 tablet (5 mg total) by mouth 2 (two) times daily. 60 tablet 3  . aspirin EC 81 MG tablet Take 1 tablet (81 mg total) by mouth daily. 90 tablet 3  . atorvastatin (LIPITOR) 10 MG tablet Take 10 mg by mouth daily at 6 PM.     . [employer] PURELAX 17 GM/SCOOP powder Take by mouth.    . furosemide (LASIX) 20 MG tablet Take 1 tablet (20 mg total) by mouth daily. 90 tablet 3  . HYDROcodone-acetaminophen (NORCO) 7.5-325 MG tablet Take 0.5-1 tablets by mouth 2 (two) times daily as needed.    [name] levothyroxine (SYNTHROID, LEVOTHROID) 88 MCG tablet Take 88 mcg by mouth daily before breakfast.    . mirtazapine (REMERON) 15 MG tablet Take 1 tablet by mouth daily.    . saw palmetto 500 MG capsule Take 500 mg by mouth daily.     No current facility-administered medications for this encounter.    Allergies  Allergen Reactions  . Aleve [Naproxen Sodium] Hives  . Codeine Other (See Comments)    unknown  . Penicillin G Benzathine     Pt doesn't remember reaction    Social History   Socioeconomic History  . Marital status: Married    Spouse name: Not on file  . Number of children: Not on file  . Years of education: Not on file  . Highest education level: Not on file  Occupational History  . Not on file  Tobacco Use  . Smoking status: Former Smoker    Packs/day: 0.50    Years: 30.00    Pack years: 15.00    Types: Cigarettes    Quit date: 03/30/1965    Years since quitting: 55.2  . Smokeless tobacco: Current User    Types: Chew  Vaping Use  . Vaping Use: Never used  Substance and Sexual Activity  . Alcohol use: No  . Drug use: No  . Sexual activity: Not on file   Other Topics Concern  . Not on file  Social History Narrative  . Not on file   Social Determinants of Health   Financial Resource Strain: Not on file  Food Insecurity: Not on file  Transportation Needs: Not on file  Physical Activity: Not on file  Stress: Not on file  Social Connections: Not on file  Intimate Partner Violence: Not on file     ROS- All systems are reviewed and negative except as per the HPI above.  Physical Exam: Vitals:   05/31/20 1353  BP: (!) 152/90  Pulse: 60  Weight: 63 kg  Height: 5\' 10"  (1.778 m)    GEN- The patient is a well appearing elderly male, alert and oriented  x 3 today.   Head- normocephalic, atraumatic Eyes-  Sclera clear, conjunctiva pink Ears- hearing intact Oropharynx- clear Neck- supple  Lungs- Clear to ausculation bilaterally, normal work of breathing Heart- Regular rate and rhythm, no murmurs, rubs or gallops  GI- soft, NT, ND, + BS Extremities- no clubbing, cyanosis, or edema MS- no significant deformity or atrophy Skin- no rash or lesion Psych- euthymic mood, full affect Neuro- strength and sensation are intact  Wt Readings from Last 3 Encounters:  05/31/20 63 kg  11/23/19 61.7 kg  08/31/19 74.6 kg    EKG today demonstrates  V paced rhythm with underlying afib  Vent. rate 60 BPM PR interval * ms QRS duration 182 ms QT/QTc 508/508 ms  Echo 05/31/17 demonstrated  Left ventricle: The cavity size was mildly dilated. There was  mild concentric hypertrophy. Systolic function was normal. The  estimated ejection fraction was in the range of 60% to 65%. Wall  motion was normal; there were no regional wall motion  abnormalities. The study is not technically sufficient to allow  evaluation of LV diastolic function due to underlying second  degree AV block.  - Aortic valve: Poorly visualized. A 2.3 cmpericardial  bioprosthesis was present and functioning normally. Mean gradient  (S): 10 mm Hg. Valve area  (VTI): 1.33 cm^2. Valve area (Vmax):  1.29 cm^2. Valve area (Vmean): 1.4 cm^2.  - Left atrium: The atrium was mildly dilated.  - Pulmonary arteries: PA peak pressure: 64 mm Hg (S).   Impressions:   - The right ventricular systolic pressure was increased consistent  with moderate pulmonary hypertension.   Epic records are reviewed at length today  CHA2DS2-VASc Score = 4  The patient's score is based upon: CHF History: No HTN History: Yes Diabetes History: No Stroke History: No Vascular Disease History: Yes Age Score: 2 Gender Score: 0      ASSESSMENT AND PLAN: 1. Persistent atrial fibrillation  The patient's CHA2DS2-VASc score is 4, indicating a 4.8% annual risk of stroke.   General education about afib provided and questions answered. We also discussed his stroke risk and the risks and benefits of anticoagulation. Will plan to start Eliquis 5 mg BID. Check bmet/CBC today.  Will d/w Dr Marlou Porch regarding ASA. Will have patient follow up in 3 weeks. If he remains persistent and he feels symptomatic, will plan for DCCV. He may be a good candidate for a rate control strategy if he is asymptomatic.   2. Secondary Hypercoagulable State (ICD10:  D68.69) The patient is at significant risk for stroke/thromboembolism based upon his CHA2DS2-VASc Score of 4.  Start Apixaban (Eliquis).   3. CHB S/p PPM, followed by Dr Lovena Le and the device clinic.  4. HTN Stable, no changes today.  5. AVR Followed by Dr Marlou Porch.   Follow up in the AF clinic in 3 weeks.    [facility] [address] [place], [place] [address] [phone] 05/31/2020 3:55 PM

## 2020-05-31 NOTE — Patient Instructions (Signed)
Start Eliquis 5mg twice a day 

## 2020-06-03 ENCOUNTER — Other Ambulatory Visit (HOSPITAL_COMMUNITY): Payer: Self-pay | Admitting: *Deleted

## 2020-06-04 DIAGNOSIS — M5416 Radiculopathy, lumbar region: Secondary | ICD-10-CM | POA: Diagnosis not present

## 2020-06-04 DIAGNOSIS — M5136 Other intervertebral disc degeneration, lumbar region: Secondary | ICD-10-CM | POA: Diagnosis not present

## 2020-06-04 DIAGNOSIS — M48062 Spinal stenosis, lumbar region with neurogenic claudication: Secondary | ICD-10-CM | POA: Diagnosis not present

## 2020-06-05 NOTE — Progress Notes (Signed)
Remote pacemaker transmission.   

## 2020-06-20 ENCOUNTER — Encounter (HOSPITAL_COMMUNITY): Payer: Self-pay | Admitting: Physician Assistant

## 2020-06-20 ENCOUNTER — Other Ambulatory Visit (HOSPITAL_COMMUNITY): Payer: Self-pay | Admitting: *Deleted

## 2020-06-20 ENCOUNTER — Other Ambulatory Visit: Payer: Self-pay

## 2020-06-20 ENCOUNTER — Ambulatory Visit (HOSPITAL_COMMUNITY)
Admission: RE | Admit: 2020-06-20 | Discharge: 2020-06-20 | Disposition: A | Discharge status: Home / Self Care | Payer: Medicare Other | Source: Ambulatory Visit | Attending: Physician Assistant | Admitting: Physician Assistant

## 2020-06-20 VITALS — BP 152/90 | HR 60 | Ht 70.0 in | Wt 137.6 lb

## 2020-06-20 DIAGNOSIS — I10 Essential (primary) hypertension: Secondary | ICD-10-CM | POA: Diagnosis not present

## 2020-06-20 DIAGNOSIS — D6869 Other thrombophilia: Secondary | ICD-10-CM | POA: Diagnosis not present

## 2020-06-20 DIAGNOSIS — Z950 Presence of cardiac pacemaker: Secondary | ICD-10-CM | POA: Diagnosis not present

## 2020-06-20 DIAGNOSIS — I4510 Unspecified right bundle-branch block: Secondary | ICD-10-CM | POA: Insufficient documentation

## 2020-06-20 DIAGNOSIS — I4819 Other persistent atrial fibrillation: Secondary | ICD-10-CM | POA: Diagnosis not present

## 2020-06-20 DIAGNOSIS — Z7901 Long term (current) use of anticoagulants: Secondary | ICD-10-CM | POA: Diagnosis not present

## 2020-06-20 DIAGNOSIS — Z87891 Personal history of nicotine dependence: Secondary | ICD-10-CM | POA: Diagnosis not present

## 2020-06-20 DIAGNOSIS — Z954 Presence of other heart-valve replacement: Secondary | ICD-10-CM | POA: Diagnosis not present

## 2020-06-20 MED ORDER — APIXABAN 5 MG PO TABS: 5.0000 mg | ORAL_TABLET | Freq: Two times a day (BID) | ORAL | 6 refills | Status: DC

## 2020-06-20 NOTE — Progress Notes (Signed)
Primary Care Physician: Wenda Low, MD Primary Cardiologist: Dr Marlou Porch Primary Electrophysiologist: Dr Lovena Le Referring Physician: Dr Toney Sang is a 87 y.o. male with a history of CHB s/p PPM, AVR, HTN, and atrial fibrillation who presents for follow up in the [facility]. The patient was initially diagnosed with atrial fibrillation 05/27/20 after a scheduled remote device transmission showing an ongoing episode. Patient had symptoms of increased fatigue and SOB that day but he admits that these symptoms occurred intermittently prior to this. Patient has a CHADS2VASC score of 4.   On follow up today, patient reports that he has done well since his last visit. He states he is still able to walk around his driveway without issue. He denies any bleeding issues on anticoagulation.   Today, he denies symptoms of palpitations, chest pain, orthopnea, PND, lower extremity edema, dizziness, presyncope, syncope, snoring, daytime somnolence, bleeding, or neurologic sequela. The patient is tolerating medications without difficulties and is otherwise without complaint today.    Atrial Fibrillation Risk Factors:  he does not have symptoms or diagnosis of sleep apnea. he does not have a history of rheumatic fever.   he has a BMI of Body mass index is 19.74 kg/m.[name] Filed Weights   06/20/20 1432  Weight: 62.4 kg    Family History  Problem Relation Age of Onset  . Diabetes Mellitus II Mother      Atrial Fibrillation Management history:  Previous antiarrhythmic drugs: none Previous cardioversions: none Previous ablations: none CHADS2VASC score: 4 Anticoagulation history: Eliquis   Past Medical History:  Diagnosis Date  . Allergic rhinitis    ,RAD wheezing  . Cataracts, bilateral   . Complication of anesthesia    hallucinations after aaa surgery and valve replacement  . Diverticulosis    , ACBE, flex, in 2003  . Dyslipidemia   .  Fracture of left clavicle    , Hx  . Hearing loss    , Bilateral  . History of kidney stones    h/o  . Hypotension 11/2018  . Hypothyroidism   . Insomnia   . OA (optic atrophy)   . Osteoarthritis   . Pelvic fracture (HCC)    , Hx  . RBBB (right bundle branch block)    , seen on EKG  . Varicose veins    Past Surgical History:  Procedure Laterality Date  . ABDOMINAL AORTOGRAM N/A 06/22/2016   Procedure: Abdominal Aortogram;  Surgeon: Katha Cabal, MD;  Location: [facility];  Service: Cardiovascular;  Laterality: N/A;  . AORTIC INTERVENTION N/A 06/22/2016   Procedure: Aortic Intervention;  Surgeon: Katha Cabal, MD;  Location: [facility];  Service: Cardiovascular;  Laterality: N/A;  . AORTIC VALVE REPLACEMENT  2011   COW VALVE  . CATARACT EXTRACTION W/PHACO Right 02/17/2016   Procedure: CATARACT EXTRACTION PHACO AND INTRAOCULAR LENS PLACEMENT (IOC);  Surgeon: Ronnell Freshwater, MD;  Location: [facility];  Service: Ophthalmology;  Laterality: Right;  RIGHT  . INGUINAL HERNIA REPAIR Bilateral   . KYPHOPLASTY N/A 06/29/2019   Procedure: T12 KYPHOPLASTY;  Surgeon: Hessie Knows, MD;  Location: [facility];  Service: Orthopedics;  Laterality: N/A;  . KYPHOPLASTY N/A 07/11/2019   Procedure: T11 KYPHOPLASTY;  Surgeon: Hessie Knows, MD;  Location: [facility];  Service: Orthopedics;  Laterality: N/A;  . KYPHOPLASTY N/A 07/25/2019   Procedure: L1 KYPHOPLASTY;  Surgeon: Hessie Knows, MD;  Location: [facility];  Service: Orthopedics;  Laterality: N/A;  .  Orchectomy Right   . PACEMAKER IMPLANT N/A 05/31/2017   Procedure: PACEMAKER IMPLANT;  Surgeon: Evans Lance, MD;  Location: [facility];  Service: Cardiovascular;  Laterality: N/A;  . TEMPORARY PACEMAKER N/A 05/31/2017   Procedure: TEMPORARY PACEMAKER;  Surgeon: Sherren Mocha, MD;  Location: [facility];  Service: Cardiovascular;  Laterality: N/A;  . THYROIDECTOMY      Current Outpatient  Medications  Medication Sig Dispense Refill  . acetaminophen (TYLENOL) 650 MG CR tablet Take 1,300 mg by mouth every 8 (eight) hours as needed for pain.    [name] albuterol (PROVENTIL HFA;VENTOLIN HFA) 108 (90 BASE) MCG/ACT inhaler Inhale 2 puffs into the lungs every 6 (six) hours as needed for wheezing or shortness of breath.    [name] apixaban (ELIQUIS) 5 MG TABS tablet Take 1 tablet (5 mg total) by mouth 2 (two) times daily. 60 tablet 3  . atorvastatin (LIPITOR) 10 MG tablet Take 10 mg by mouth daily at 6 PM.     . [employer] PURELAX 17 GM/SCOOP powder Take by mouth.    . furosemide (LASIX) 20 MG tablet Take 1 tablet (20 mg total) by mouth daily. 90 tablet 3  . HYDROcodone-acetaminophen (NORCO) 7.5-325 MG tablet Take 0.5-1 tablets by mouth 2 (two) times daily as needed.    [name] levothyroxine (SYNTHROID, LEVOTHROID) 88 MCG tablet Take 88 mcg by mouth daily before breakfast.    . mirtazapine (REMERON) 15 MG tablet Take 1 tablet by mouth daily.    . saw palmetto 500 MG capsule Take 500 mg by mouth daily.     No current facility-administered medications for this encounter.    Allergies  Allergen Reactions  . Aleve [Naproxen Sodium] Hives  . Codeine Other (See Comments)    unknown  . Penicillin G Benzathine     Pt doesn't remember reaction    Social History   Socioeconomic History  . Marital status: Married    Spouse name: Not on file  . Number of children: Not on file  . Years of education: Not on file  . Highest education level: Not on file  Occupational History  . Not on file  Tobacco Use  . Smoking status: Former Smoker    Packs/day: 0.50    Years: 30.00    Pack years: 15.00    Types: Cigarettes    Quit date: 03/30/1965    Years since quitting: 55.2  . Smokeless tobacco: Current User    Types: Chew  Vaping Use  . Vaping Use: Never used  Substance and Sexual Activity  . Alcohol use: Yes    Alcohol/week: 1.0 standard drink    Types: 1 Shots of liquor per week  . Drug use: No  . Sexual  activity: Not on file  Other Topics Concern  . Not on file  Social History Narrative  . Not on file   Social Determinants of Health   Financial Resource Strain: Not on file  Food Insecurity: Not on file  Transportation Needs: Not on file  Physical Activity: Not on file  Stress: Not on file  Social Connections: Not on file  Intimate Partner Violence: Not on file     ROS- All systems are reviewed and negative except as per the HPI above.  Physical Exam: Vitals:   06/20/20 1432  BP: (!) 152/90  Pulse: 60  Weight: 62.4 kg  Height: 5\' 10"  (1.778 m)    GEN- The patient is a well appearing elderly male, alert and oriented x  3 today.   HEENT-head normocephalic, atraumatic, sclera clear, conjunctiva pink, hearing intact, trachea midline. Lungs- Clear to ausculation bilaterally, normal work of breathing Heart- Regular rate and rhythm, no rubs or gallops. 2/6 systolic murmur  GI- soft, NT, ND, + BS Extremities- no clubbing, cyanosis, or edema MS- no significant deformity or atrophy Skin- no rash or lesion Psych- euthymic mood, full affect Neuro- strength and sensation are intact   Wt Readings from Last 3 Encounters:  06/20/20 62.4 kg  05/31/20 63 kg  11/23/19 61.7 kg    EKG today demonstrates  V paced rhythm, underlying afib Vent. rate 60 BPM PR interval * ms QRS duration 190 ms QT/QTc 508/508 ms  Echo 05/31/17 demonstrated  Left ventricle: The cavity size was mildly dilated. There was  mild concentric hypertrophy. Systolic function was normal. The  estimated ejection fraction was in the range of 60% to 65%. Wall  motion was normal; there were no regional wall motion  abnormalities. The study is not technically sufficient to allow  evaluation of LV diastolic function due to underlying second  degree AV block.  - Aortic valve: Poorly visualized. A 2.3 cmpericardial  bioprosthesis was present and functioning normally. Mean gradient  (S): 10 mm Hg.  Valve area (VTI): 1.33 cm^2. Valve area (Vmax):  1.29 cm^2. Valve area (Vmean): 1.4 cm^2.  - Left atrium: The atrium was mildly dilated.  - Pulmonary arteries: PA peak pressure: 64 mm Hg (S).   Impressions:   - The right ventricular systolic pressure was increased consistent  with moderate pulmonary hypertension.   Epic records are reviewed at length today  CHA2DS2-VASc Score = 4  The patient's score is based upon: CHF History: No HTN History: Yes Diabetes History: No Stroke History: No Vascular Disease History: Yes Age Score: 2 Gender Score: 0      ASSESSMENT AND PLAN: 1. Persistent atrial fibrillation  The patient's CHA2DS2-VASc score is 4, indicating a 4.8% annual risk of stroke.   We discussed therapeutic options with rate vs rhythm control at length today. After discussing with patient and his family, will plan for a conservative rate control strategy. Will not pursue DCCV at this time.  Continue Eliquis 5 mg BID. ASA discontinued.   2. Secondary Hypercoagulable State (ICD10:  D68.69) The patient is at significant risk for stroke/thromboembolism based upon his CHA2DS2-VASc Score of 4.  Continue Apixaban (Eliquis).   3. CHB S/p PPM, followed by Dr Lovena Le and the device clinic.  4. HTN Stable, no changes today.  5. AVR Followed by Dr Marlou Porch.   Follow up with Dr Marlou Porch and Dr Lovena Le per recalls. AF clinic as needed.    [facility] [address] [place], [place] [address] [phone] 06/20/2020 3:12 PM

## 2020-07-01 ENCOUNTER — Other Ambulatory Visit: Payer: Self-pay

## 2020-07-01 ENCOUNTER — Encounter: Payer: Self-pay | Admitting: Student in an Organized Health Care Education/Training Program

## 2020-07-01 ENCOUNTER — Ambulatory Visit
Admission: RE | Admit: 2020-07-01 | Discharge: 2020-07-01 | Disposition: A | Discharge status: Home / Self Care | Payer: Medicare Other | Source: Ambulatory Visit | Attending: Student in an Organized Health Care Education/Training Program | Admitting: Student in an Organized Health Care Education/Training Program

## 2020-07-01 ENCOUNTER — Ambulatory Visit (HOSPITAL_BASED_OUTPATIENT_CLINIC_OR_DEPARTMENT_OTHER): Payer: Medicare Other | Admitting: Student in an Organized Health Care Education/Training Program

## 2020-07-01 VITALS — BP 130/71 | HR 60 | Temp 97.2°F | Resp 16 | Ht 69.0 in | Wt 137.0 lb

## 2020-07-01 DIAGNOSIS — M5416 Radiculopathy, lumbar region: Secondary | ICD-10-CM | POA: Diagnosis not present

## 2020-07-01 DIAGNOSIS — G8929 Other chronic pain: Secondary | ICD-10-CM | POA: Insufficient documentation

## 2020-07-01 DIAGNOSIS — Z8781 Personal history of (healed) traumatic fracture: Secondary | ICD-10-CM

## 2020-07-01 DIAGNOSIS — M47816 Spondylosis without myelopathy or radiculopathy, lumbar region: Secondary | ICD-10-CM

## 2020-07-01 DIAGNOSIS — M5136 Other intervertebral disc degeneration, lumbar region: Secondary | ICD-10-CM | POA: Insufficient documentation

## 2020-07-01 DIAGNOSIS — Z9889 Other specified postprocedural states: Secondary | ICD-10-CM

## 2020-07-01 DIAGNOSIS — M545 Low back pain, unspecified: Secondary | ICD-10-CM | POA: Diagnosis not present

## 2020-07-01 DIAGNOSIS — M51369 Other intervertebral disc degeneration, lumbar region without mention of lumbar back pain or lower extremity pain: Secondary | ICD-10-CM

## 2020-07-01 DIAGNOSIS — M546 Pain in thoracic spine: Secondary | ICD-10-CM | POA: Diagnosis not present

## 2020-07-01 NOTE — Progress Notes (Signed)
Patient: Jay [name]  Service Category: E/M  Provider: Gillis Santa, MD  DOB: 08-28-1925  DOS: 07/01/2020  Referring Provider: Sharlet Salina, MD  MRN: [id number]  Setting: Ambulatory outpatient  PCP: Wenda Low, MD  Type: New Patient  Specialty: Interventional Pain Management    Location: Office  Delivery: Face-to-face     Primary Reason(s) for Visit: Encounter for initial evaluation of one or more chronic problems (new to examiner) potentially causing chronic pain, and posing a threat to normal musculoskeletal function. (Level of risk: High) CC: Back Pain  HPI  Jay [name] is a 87 y.o. year old, male patient, who comes for the first time to our practice referred by Sharlet Salina, MD for our initial evaluation of his chronic pain. He has Hypothyroidism; Dyslipidemia; RBBB (right bundle branch block); H/O aortic valve replacement; Chest pain; Essential hypertension; CAD (coronary artery disease), native coronary artery; Dizziness; Ruptured abdominal aortic aneurysm (AAA) ([place]); Solar purpura ([place]); Second degree AV block, Mobitz type II; SOB (shortness of breath); Coronary artery disease due to lipid rich plaque; Complete heart block ([place]); Pacemaker; Persistent atrial fibrillation ([place]); Secondary hypercoagulable state ([place]); History of compression fracture of spine; Status post kyphoplasty; Lumbar spine pain; Thoracic spine pain; Chronic radicular lumbar pain; Lumbar degenerative disc disease; and Lumbar facet arthropathy on their problem list. Today he comes in for evaluation of his Back Pain  Pain Assessment: Location: Lower,Right,Left Back Radiating: Denies Onset: More than a month ago Duration: Chronic pain Quality: Aching,Constant,Dull Severity: 10-Worst pain ever/10 (subjective, self-reported pain score)  Effect on ADL: "Has to sit most of the time and when I stand up I feel like I need to sit" Timing: Constant Modifying factors: Sitting BP: 130/71  HR: 60  Onset and  Duration: Gradual Cause of pain: Unknown Severity: No change since onset and NAS-11 on the average: 10/10 Timing: na Aggravating Factors: na Alleviating Factors: sitting  Jay [name] is being referred from Dr. Sharlet Salina for consideration of spinal cord stimulator trial.  In brief, Jay [name] struggles with severe persistent low back pain.  He states that this got worse after his kyphoplasty related to thoracic and lumbar compression fractures.  He has had multiple interventions performed, they are noted below.  He is being referred here to consider possible spinal cord stimulation.  See Dr Sharlet Salina previous progress note (06/04/20) below:  The patient is a pleasant 87 year old gentleman who presents today for follow-up of acute bilateral back pain with radiation to the bilateral buttock that began in March 2021 after taking his garbage out he stepped in a hole. He describes his pain as severe, sharp, aching and constant with intermittent flares of pain. His pain increases when standing, walking and at times sitting. He is able to stand for approximately 5 minutes before feeling severe pain and needing to sit. He has been using a heating pad and has a noted waffle type burn to his low back related to this. He has received 3 kyphoplasties with mild to moderate benefit. Medications have included hydrocodone (mild to moderate relief, some balance impairment), tramadol (mild relief, caused patient to feel unsteady), gabapentin 100 mg (twice daily with sedation, discontinued), Tylenol (mild relief).  He was last evaluated by Dr. Rudene Christians and SPECT bone scan on 08/05/2019 was negative for acute compression fracture. Previously noted T11, T12 and L1 compression fractures with prior kyphoplasty.  His wife passed away in 19-Jan-2020.  He was last evaluated on 05/01/2020 at which time he continued with Tylenol, gabapentin 100 mg  twice daily, Norco 7.5/325 1/2 to 1 tablet twice daily as needed, and he was scheduled for  a bilateral L3-4 transforaminal ESI.  He is accompanied by his daughter (paramedic) today. He has one other daughter and a son as well.   Today he reports that the [facility] did not provide any notable improvement of his pain. His daughter believes that he is walking around less stiff but overall he continues to have standing tolerance limited to approximately 5 minutes. He has difficulty with shaving.  His daughter indicates that he had a pacemaker placed approximately 3 years ago. She is unsure if it is MRI compatible. She would like to reach out to his cardiologist to see if it may be possible to obtain an MRI.  He continues with Norco 7.5/325 1/2 tablet twice daily. His daughter indicates that he sleeps a lot because of the pain medication and she is reluctant to increase this further. We discussed limited conservative options. They would like to receive a consult to discuss a possible spinal cord stimulator trial with Dr. Holley Raring at the [facility] pain clinic. If he is not felt to be a candidate for an SCS trial I explained that I would be happy to manage his pain medication.  The [place] was reviewed today. The patient has signed a [employer] contract (11/14/2019). Tox assure 13 obtained in office on 11/14/2019 (appropriate)  Procedures: 05/14/2020: Bilateral L3-4 transforaminal ESI (minimal relief) 04/26/2020: MBB to the right L2-3 and L3-4 facet joints (8/10 to no relief) 03/26/2020: MBB to the right L2-3 and L3-4 facet joints (10/10 to 2-3/10) 03/11/2020: MBB to the right L4-5 and L5-S1 facet joints (8/10 to no relief) 12/13/2019: Bilateral S1 transforaminal ESI (mild relief) 09/26/2019: Bilateral S1 transforaminal ESI (mild to moderate relief) 08/30/2019: Right S1 transforaminal ESI (mild to moderate relief) 08/25/2019: Bilateral L1 trigger point injections (little relief) 07/25/2019: L1 Kyphoplasty 07/11/2019: T11 Kyoplasty 06/29/2019: T12  Kyphoplasty  Meds   Current Outpatient Medications:  .  acetaminophen (TYLENOL) 650 MG CR tablet, Take 1,300 mg by mouth every 8 (eight) hours as needed for pain., Disp: , Rfl:  .  albuterol (PROVENTIL HFA;VENTOLIN HFA) 108 (90 BASE) MCG/ACT inhaler, Inhale 2 puffs into the lungs every 6 (six) hours as needed for wheezing or shortness of breath., Disp: , Rfl:  .  apixaban (ELIQUIS) 5 MG TABS tablet, Take 1 tablet (5 mg total) by mouth 2 (two) times daily., Disp: 60 tablet, Rfl: 6 .  atorvastatin (LIPITOR) 10 MG tablet, Take 10 mg by mouth daily at 6 PM. , Disp: , Rfl:  .  [employer] PURELAX 17 GM/SCOOP powder, Take by mouth., Disp: , Rfl:  .  HYDROcodone-acetaminophen (NORCO) 7.5-325 MG tablet, Take 0.5-1 tablets by mouth 2 (two) times daily as needed., Disp: , Rfl:  .  levothyroxine (SYNTHROID, LEVOTHROID) 88 MCG tablet, Take 88 mcg by mouth daily before breakfast., Disp: , Rfl:  .  mirtazapine (REMERON) 15 MG tablet, Take 1 tablet by mouth daily., Disp: , Rfl:  .  saw palmetto 500 MG capsule, Take 500 mg by mouth daily., Disp: , Rfl:  .  furosemide (LASIX) 20 MG tablet, Take 1 tablet (20 mg total) by mouth daily., Disp: 90 tablet, Rfl: 3  Imaging Review  DG Cervical Spine Complete  Narrative FINDINGS HISTORY:        FELL - BACK PAIN. LUMBAR SPINE: CROSS-TABLE LATERAL AND AP VIEWS SHOW NO COMPRESSION FRACTURES, SUBLUXATIONS, OR ACUTE CHANGES. THERE IS FUSIFORM DILATATION OF THE  DISTAL ABDOMINAL AORTA CONSISTENT WITH ECTASIA OR A SMALL ANEURYSM. IMPRESSION 1.    NO ACUTE ABNORMALITY. 2.    ECTASIA VERSUS SMALL CALCIFIED ABDOMINAL AORTIC ANEURYSM. HISTORY:        FELL. CERVICAL SPINE: ROUTINE VIEWS ARE TECHNICALLY LIMITED DUE TO THE PATIENT'S CONDITION. ( HE HAS A PELVIC FRACTURE). THERE ARE NO ACUTE CHANGES.  THERE IS MULTILEVEL DEGENERATIVE DISK DISEASE.   PREVERTEBRAL SOFT TISSUES ARE NORMAL. IMPRESSION DEGENERATIVE DISK DISEASE - NO ACUTE ABNORMALITY. HISTORY:        FELL. SKULL  SERIES: ROUTINE VIEWS SHOW THE CALVARIUM TO BE INTACT.   THERE ARE PROMINENT VASCULAR MARKINGS.   NO OBVIOUS FRACTURE.   THE PINEAL IS MIDLINE. IMPRESSION NO ACUTE OR SIGNIFICANT FINDINGS.  Narrative CLINICAL DATA:  T11 kyphoplasty.  EXAM: DG C-ARM 1-60 MIN; THORACIC SPINE 2 VIEWS  CONTRAST:  None.  FLUOROSCOPY TIME:  Fluoroscopy Time:  1 minutes 50 seconds  Radiation Exposure Index (if provided by the fluoroscopic device): Unknown.  Number of Acquired Spot Images: 2  COMPARISON:  06/29/2019 and CT 06/23/2019  FINDINGS: Evidence of patient's T12 kyphoplasty. Interval kyphoplasty of T11 compression fracture. Radiopaque material remains within the confines of the T11 and T12 vertebral bodies. Recommend correlation with findings at the time of the procedure.  IMPRESSION: Expected changes post T11 kyphoplasty.   Electronically Signed By: Marin Olp M.D. On: 07/11/2019 16:20  DG Lumbar Spine 2-3 Views  Narrative CLINICAL DATA:  L1 kyphoplasty  EXAM: LUMBAR SPINE - 2-3 VIEW; DG C-ARM 1-60 MIN  COMPARISON:  None.  FLUOROSCOPY TIME:  Fluoroscopy Time:  18 seconds  Radiation Exposure Index (if provided by the fluoroscopic device): 7.9 mGy  Number of Acquired Spot Images: 1  FINDINGS: Contrast laden cement is noted throughout the L1 vertebral body consistent with the recent kyphoplasty. Cement is also noted in the T12 vertebral body consistent prior surgery.  IMPRESSION: Status post L1 kyphoplasty   Electronically Signed By: Inez Catalina M.D. On: 07/25/2019 18:58  Complexity Note: Imaging results reviewed. Results shared with Jay [name], using Layman's terms.                         ROS  Cardiovascular: Abnormal heart rhythm, Heart surgery and Pacemaker or defibrillator Pulmonary or Respiratory: Shortness of breath Neurological: No reported neurological signs or symptoms such as seizures, abnormal skin sensations, urinary and/or fecal incontinence,  being born with an abnormal open spine and/or a tethered spinal cord Psychological-Psychiatric: No reported psychological or psychiatric signs or symptoms such as difficulty sleeping, anxiety, depression, delusions or hallucinations (schizophrenial), mood swings (bipolar disorders) or suicidal ideations or attempts Gastrointestinal: Heartburn due to stomach pushing into lungs (Hiatal hernia) Genitourinary: Passing kidney stones Hematological: Brusing easily Endocrine: Slow thyroid Rheumatologic: No reported rheumatological signs and symptoms such as fatigue, joint pain, tenderness, swelling, redness, heat, stiffness, decreased range of motion, with or without associated rash Musculoskeletal: Negative for myasthenia gravis, muscular dystrophy, multiple sclerosis or malignant hyperthermia Work History: Retired  Allergies  Jay [name] is allergic to [employer] sodium], codeine, and penicillin g benzathine.  Laboratory Chemistry Profile   Renal Lab Results  Component Value Date   BUN 22 05/31/2020   CREATININE 1.36 (H) 05/31/2020   GFRAA 60 (L) 06/23/2019   GFRNONAA 49 (L) 05/31/2020   PROTEINUR 30 (A) 06/23/2019     Electrolytes Lab Results  Component Value Date   NA 139 05/31/2020   K 4.3 05/31/2020   CL 104  05/31/2020   CALCIUM 8.6 (L) 05/31/2020   MG 2.4 05/30/2017   PHOS 3.0 05/30/2017     Hepatic Lab Results  Component Value Date   AST 29 06/13/2019   ALT 18 06/13/2019   ALBUMIN 4.3 06/13/2019   ALKPHOS 91 06/13/2019   LIPASE 50 06/13/2019     ID Lab Results  Component Value Date   SARSCOV2NAA NEGATIVE 07/24/2019   STAPHAUREUS NEGATIVE 05/31/2017   MRSAPCR NEGATIVE 05/31/2017     Bone No results found for: VD25OH, HY850YD7AJO, IN8676HM0, NO7096GE3, 25OHVITD1, 25OHVITD2, 25OHVITD3, TESTOFREE, TESTOSTERONE   Endocrine Lab Results  Component Value Date   GLUCOSE 107 (H) 05/31/2020   GLUCOSEU NEGATIVE 06/23/2019   HGBA1C 5.4 03/30/2016   TSH 0.836  05/30/2017     Neuropathy Lab Results  Component Value Date   HGBA1C 5.4 03/30/2016     CNS No results found for: COLORCSF, APPEARCSF, RBCCOUNTCSF, WBCCSF, POLYSCSF, LYMPHSCSF, EOSCSF, PROTEINCSF, GLUCCSF, JCVIRUS, CSFOLI, IGGCSF, LABACHR, ACETBL, LABACHR, ACETBL   Inflammation (CRP: Acute  ESR: Chronic) No results found for: CRP, ESRSEDRATE, LATICACIDVEN   Rheumatology No results found for: RF, ANA, LABURIC, URICUR, LYMEIGGIGMAB, LYMEABIGMQN, HLAB27   Coagulation Lab Results  Component Value Date   INR 1.14 05/30/2017   LABPROT 14.5 05/30/2017   APTT 28 05/30/2017   PLT 137 (L) 05/31/2020     Cardiovascular Lab Results  Component Value Date   TROPONINI <0.03 03/30/2016   HGB 12.5 (L) 05/31/2020   HCT 40.9 05/31/2020     Screening Lab Results  Component Value Date   SARSCOV2NAA NEGATIVE 07/24/2019   STAPHAUREUS NEGATIVE 05/31/2017   MRSAPCR NEGATIVE 05/31/2017     Cancer No results found for: CEA, CA125, LABCA2   Allergens No results found for: ALMOND, APPLE, ASPARAGUS, AVOCADO, BANANA, BARLEY, BASIL, BAYLEAF, GREENBEAN, LIMABEAN, WHITEBEAN, BEEFIGE, REDBEET, BLUEBERRY, BROCCOLI, CABBAGE, MELON, CARROT, CASEIN, CASHEWNUT, CAULIFLOWER, CELERY     Note: Lab results reviewed.  [place]  Drug: Jay [name]  reports no history of drug use. Alcohol:  reports current alcohol use of about 1.0 standard drink of alcohol per week. Tobacco:  reports that he quit smoking about 55 years ago. His smoking use included cigarettes. He has a 15.00 pack-year smoking history. His smokeless tobacco use includes chew. Medical:  has a past medical history of Allergic rhinitis, Cataracts, bilateral, Complication of anesthesia, Diverticulosis, Dyslipidemia, Fracture of left clavicle, Hearing loss, History of kidney stones, Hypotension (11/2018), Hypothyroidism, Insomnia, OA (optic atrophy), Osteoarthritis, Pelvic fracture ([place]), RBBB (right bundle branch block), and Varicose veins. Family:  family history includes Diabetes Mellitus II in his mother.  Past Surgical History:  Procedure Laterality Date  . ABDOMINAL AORTOGRAM N/A 06/22/2016   Procedure: Abdominal Aortogram;  Surgeon: Katha Cabal, MD;  Location: [facility];  Service: Cardiovascular;  Laterality: N/A;  . AORTIC INTERVENTION N/A 06/22/2016   Procedure: Aortic Intervention;  Surgeon: Katha Cabal, MD;  Location: [facility];  Service: Cardiovascular;  Laterality: N/A;  . AORTIC VALVE REPLACEMENT  2011   COW VALVE  . CATARACT EXTRACTION W/PHACO Right 02/17/2016   Procedure: CATARACT EXTRACTION PHACO AND INTRAOCULAR LENS PLACEMENT (IOC);  Surgeon: Ronnell Freshwater, MD;  Location: [place];  Service: Ophthalmology;  Laterality: Right;  RIGHT  . INGUINAL HERNIA REPAIR Bilateral   . KYPHOPLASTY N/A 06/29/2019   Procedure: T12 KYPHOPLASTY;  Surgeon: Hessie Knows, MD;  Location: [facility];  Service: Orthopedics;  Laterality: N/A;  . KYPHOPLASTY N/A 07/11/2019   Procedure: T11 KYPHOPLASTY;  Surgeon: Hessie Knows, MD;  Location: [facility];  Service: Orthopedics;  Laterality: N/A;  . KYPHOPLASTY N/A 07/25/2019   Procedure: L1 KYPHOPLASTY;  Surgeon: Hessie Knows, MD;  Location: [facility];  Service: Orthopedics;  Laterality: N/A;  . Orchectomy Right   . PACEMAKER IMPLANT N/A 05/31/2017   Procedure: PACEMAKER IMPLANT;  Surgeon: Evans Lance, MD;  Location: [place] [facility];  Service: Cardiovascular;  Laterality: N/A;  . TEMPORARY PACEMAKER N/A 05/31/2017   Procedure: TEMPORARY PACEMAKER;  Surgeon: Sherren Mocha, MD;  Location: [place] [facility];  Service: Cardiovascular;  Laterality: N/A;  . THYROIDECTOMY     Active Ambulatory Problems    Diagnosis Date Noted  . Hypothyroidism   . Dyslipidemia   . RBBB (right bundle branch block)   . H/O aortic valve replacement 10/03/2013  . Chest pain 03/30/2016  . Essential hypertension 03/30/2016  . CAD (coronary artery disease), native  coronary artery 03/30/2016  . Dizziness 03/30/2016  . Ruptured abdominal aortic aneurysm (AAA) ([place]) 06/23/2016  . Solar purpura ([place]) 12/28/2016  . Second degree AV block, Mobitz type II 05/30/2017  . SOB (shortness of breath)   . Coronary artery disease due to lipid rich plaque   . Complete heart block ([place]) 05/31/2017  . Pacemaker 11/23/2019  . Persistent atrial fibrillation ([place]) 05/31/2020  . Secondary hypercoagulable state ([place]) 05/31/2020  . History of compression fracture of spine 07/01/2020  . Status post kyphoplasty 07/01/2020  . Lumbar spine pain 07/01/2020  . Thoracic spine pain 07/01/2020  . Chronic radicular lumbar pain 07/01/2020  . Lumbar degenerative disc disease 07/01/2020  . Lumbar facet arthropathy 07/01/2020   Resolved Ambulatory Problems    Diagnosis Date Noted  . AKI (acute kidney injury) ([place]) 03/30/2016   Past Medical History:  Diagnosis Date  . Allergic rhinitis   . Cataracts, bilateral   . Complication of anesthesia   . Diverticulosis   . Fracture of left clavicle   . Hearing loss   . History of kidney stones   . Hypotension 11/2018  . Insomnia   . OA (optic atrophy)   . Osteoarthritis   . Pelvic fracture ([place])   . Varicose veins    Constitutional Exam  General appearance: Well nourished, well developed, and well hydrated. In no apparent acute distress Vitals:   07/01/20 1415  BP: 130/71  Pulse: 60  Resp: 16  Temp: (!) 97.2 F (36.2 C)  TempSrc: Temporal  SpO2: 100%  Weight: 137 lb (62.1 kg)  Height: _0  (1.753 m)   BMI Assessment: Estimated body mass index is 20.23 kg/m as calculated from the following:   Height as of this encounter: _1  (1.753 m).   Weight as of this encounter: 137 lb (62.1 kg).  BMI interpretation table: BMI level Category Range association with higher incidence of chronic pain  <18 kg/m2 Underweight   18.5-24.9 kg/m2 Ideal body weight   25-29.9 kg/m2 Overweight Increased incidence by 20%  30-34.9 kg/m2  Obese (Class I) Increased incidence by 68%  35-39.9 kg/m2 Severe obesity (Class II) Increased incidence by 136%  >40 kg/m2 Extreme obesity (Class III) Increased incidence by 254%   Patient's current BMI Ideal Body weight  Body mass index is 20.23 kg/m. Ideal body weight: 70.7 kg (155 lb 13.8 oz)   BMI Readings from Last 4 Encounters:  07/01/20 20.23 kg/m  06/20/20 19.74 kg/m  05/31/20 19.92 kg/m  11/23/19 19.51 kg/m   Wt Readings from Last 4 Encounters:  07/01/20 137 lb (62.1  kg)  06/20/20 137 lb 9.6 oz (62.4 kg)  05/31/20 138 lb 12.8 oz (63 kg)  11/23/19 136 lb (61.7 kg)   Patient very hard of hearing.  Most of the information was obtained from and related to his daughter.    Thoracic Spine Area Exam  Skin & Axial Inspection: Well healed scar from previous spine surgery detected Alignment: Asymmetric Functional ROM: Pain restricted ROM Stability: No instability detected Muscle Tone/Strength: Functionally intact. No obvious neuro-muscular anomalies detected. Sensory (Neurological): Neurogenic pain pattern  Lumbar Exam  Skin & Axial Inspection: Thoraco-lumbar Scoliosis Alignment: Scoliosis detected Functional ROM: Decreased ROM       Stability: No instability detected Muscle Tone/Strength: Functionally intact. No obvious neuro-muscular anomalies detected. Sensory (Neurological): Neurogenic pain pattern Palpation: Complains of area being tender to palpation       Provocative Tests: Hyperextension/rotation test: Unable to perform       Lumbar quadrant test (Kemp's test): Unable to perform        Gait & Posture Assessment  Ambulation: Patient came in today in a wheel chair Gait: Significantly limited. Dependent on assistive device to ambulate Posture: Difficulty standing up straight, due to pain   Lower Extremity Exam    Side: Right lower extremity  Side: Left lower extremity  Stability: No instability observed          Stability: No instability observed           Skin & Extremity Inspection: Skin color, temperature, and hair growth are WNL. No peripheral edema or cyanosis. No masses, redness, swelling, asymmetry, or associated skin lesions. No contractures.  Skin & Extremity Inspection: Skin color, temperature, and hair growth are WNL. No peripheral edema or cyanosis. No masses, redness, swelling, asymmetry, or associated skin lesions. No contractures.  Functional ROM: Pain restricted ROM for hip and knee joints          Functional ROM: Pain restricted ROM for hip and knee joints          Muscle Tone/Strength: Functionally intact. No obvious neuro-muscular anomalies detected.  Muscle Tone/Strength: Functionally intact. No obvious neuro-muscular anomalies detected.  Sensory (Neurological): Unimpaired        Sensory (Neurological): Unimpaired        DTR: Patellar: 0: absent Achilles: deferred today Plantar: deferred today  DTR: Patellar: 0: absent Achilles: deferred today Plantar: deferred today  Palpation: No palpable anomalies  Palpation: No palpable anomalies   Assessment  Primary Diagnosis & Pertinent Problem List: The primary encounter diagnosis was History of compression fracture of spine. Diagnoses of Status post kyphoplasty, Lumbar spine pain, Thoracic spine pain, Chronic radicular lumbar pain, Lumbar degenerative disc disease, and Lumbar facet arthropathy were also pertinent to this visit.  Visit Diagnosis (New problems to examiner): 1. History of compression fracture of spine   2. Status post kyphoplasty   3. Lumbar spine pain   4. Thoracic spine pain   5. Chronic radicular lumbar pain   6. Lumbar degenerative disc disease   7. Lumbar facet arthropathy    Plan of Care (Initial workup plan)     I had a chance to review the patient's lumbar spine under live fluoroscopy and he pretty much has nonexistent interlaminar windows for a safe percutaneous trial.  He has significantly collapsed disc space and cement from his kyphoplasty really  obscures interlaminar access.  He has severe disc base collapse in his lumbar spine.  Patient had significant discomfort laying prone on her exam table.  Given his significant facet arthrosis,  scoliosis, and inability to view his interlaminar spaces secondary to cement from his kyphoplasty, do not recommend percutaneous spinal cord stimulator trial.  Patient may want to consider a open paddle trial with neurosurgery however this would require GA and may be risky for the patient given his comorbidities.  Provider-requested follow-up: No follow-ups on file.  Future Appointments  Date Time Provider [place]  08/27/2020  7:45 AM [facility] DEVICE REMOTES [facility]  10/10/2020  9:30 AM [facility] VASC 3 [facility] None  10/10/2020 10:45 AM Schnier, Dolores Lory, MD [facility] None  11/26/2020  7:45 AM [facility] DEVICE REMOTES [facility]  02/25/2021  7:45 AM [facility] DEVICE REMOTES [facility]  05/27/2021  7:45 AM [facility] DEVICE REMOTES [facility]  08/26/2021  7:45 AM [facility] DEVICE REMOTES [facility]    Note by: Gillis Santa, MD Date: 07/01/2020; Time: 3:25 PM

## 2020-08-27 ENCOUNTER — Ambulatory Visit (INDEPENDENT_AMBULATORY_CARE_PROVIDER_SITE_OTHER): Payer: Medicare Other

## 2020-08-27 DIAGNOSIS — H6121 Impacted cerumen, right ear: Secondary | ICD-10-CM | POA: Diagnosis not present

## 2020-08-27 DIAGNOSIS — I441 Atrioventricular block, second degree: Secondary | ICD-10-CM

## 2020-08-27 LAB — CUP PACEART REMOTE DEVICE CHECK
Battery Remaining Longevity: 126 mo
Battery Remaining Percentage: 95.5 %
Battery Voltage: 2.99 V
Brady Statistic AP VP Percent: 42 %
Brady Statistic AP VS Percent: 1 %
Brady Statistic AS VP Percent: 54 %
Brady Statistic AS VS Percent: 1 %
Brady Statistic RA Percent Paced: 25 %
Brady Statistic RV Percent Paced: 96 %
Date Time Interrogation Session: 20220531020015
Implantable Lead Implant Date: 20190304
Implantable Lead Implant Date: 20190304
Implantable Lead Location: 753859
Implantable Lead Location: 753860
Implantable Pulse Generator Implant Date: 20190304
Lead Channel Impedance Value: 380 Ohm
Lead Channel Impedance Value: 590 Ohm
Lead Channel Pacing Threshold Amplitude: 0.625 V
Lead Channel Pacing Threshold Amplitude: 0.625 V
Lead Channel Pacing Threshold Pulse Width: 0.5 ms
Lead Channel Pacing Threshold Pulse Width: 0.5 ms
Lead Channel Sensing Intrinsic Amplitude: 12 mV
Lead Channel Sensing Intrinsic Amplitude: 2.6 mV
Lead Channel Setting Pacing Amplitude: 0.875
Lead Channel Setting Pacing Amplitude: 1.625
Lead Channel Setting Pacing Pulse Width: 0.5 ms
Lead Channel Setting Sensing Sensitivity: 4 mV
Pulse Gen Model: 2272
Pulse Gen Serial Number: 8992729

## 2020-09-19 NOTE — Progress Notes (Signed)
Remote pacemaker transmission.   

## 2020-10-08 ENCOUNTER — Other Ambulatory Visit (INDEPENDENT_AMBULATORY_CARE_PROVIDER_SITE_OTHER): Payer: Self-pay | Admitting: Vascular Surgery

## 2020-10-08 DIAGNOSIS — I714 Abdominal aortic aneurysm, without rupture, unspecified: Secondary | ICD-10-CM

## 2020-10-10 ENCOUNTER — Other Ambulatory Visit: Payer: Self-pay

## 2020-10-10 ENCOUNTER — Ambulatory Visit (INDEPENDENT_AMBULATORY_CARE_PROVIDER_SITE_OTHER): Payer: Medicare Other | Admitting: Vascular Surgery

## 2020-10-10 ENCOUNTER — Ambulatory Visit (INDEPENDENT_AMBULATORY_CARE_PROVIDER_SITE_OTHER): Payer: Medicare Other

## 2020-10-10 DIAGNOSIS — I714 Abdominal aortic aneurysm, without rupture, unspecified: Secondary | ICD-10-CM

## 2020-10-15 ENCOUNTER — Encounter (INDEPENDENT_AMBULATORY_CARE_PROVIDER_SITE_OTHER): Payer: Self-pay | Admitting: *Deleted

## 2020-10-28 DIAGNOSIS — R300 Dysuria: Secondary | ICD-10-CM | POA: Diagnosis not present

## 2020-10-28 DIAGNOSIS — N3091 Cystitis, unspecified with hematuria: Secondary | ICD-10-CM | POA: Diagnosis not present

## 2020-10-28 DIAGNOSIS — N3001 Acute cystitis with hematuria: Secondary | ICD-10-CM | POA: Diagnosis not present

## 2020-11-16 ENCOUNTER — Other Ambulatory Visit: Payer: Self-pay | Admitting: Internal Medicine

## 2020-11-26 ENCOUNTER — Ambulatory Visit (INDEPENDENT_AMBULATORY_CARE_PROVIDER_SITE_OTHER): Payer: Medicare Other

## 2020-11-26 DIAGNOSIS — I441 Atrioventricular block, second degree: Secondary | ICD-10-CM | POA: Diagnosis not present

## 2020-11-26 LAB — CUP PACEART REMOTE DEVICE CHECK
Battery Remaining Longevity: 79 mo
Battery Remaining Percentage: 65 %
Battery Voltage: 2.99 V
Brady Statistic AP VP Percent: 45 %
Brady Statistic AP VS Percent: 1 %
Brady Statistic AS VP Percent: 49 %
Brady Statistic AS VS Percent: 1.1 %
Brady Statistic RA Percent Paced: 25 %
Brady Statistic RV Percent Paced: 94 %
Date Time Interrogation Session: 20220830020015
Implantable Lead Implant Date: 20190304
Implantable Lead Implant Date: 20190304
Implantable Lead Location: 753859
Implantable Lead Location: 753860
Implantable Pulse Generator Implant Date: 20190304
Lead Channel Impedance Value: 390 Ohm
Lead Channel Impedance Value: 560 Ohm
Lead Channel Pacing Threshold Amplitude: 0.5 V
Lead Channel Pacing Threshold Amplitude: 0.625 V
Lead Channel Pacing Threshold Pulse Width: 0.5 ms
Lead Channel Pacing Threshold Pulse Width: 0.5 ms
Lead Channel Sensing Intrinsic Amplitude: 12 mV
Lead Channel Sensing Intrinsic Amplitude: 3 mV
Lead Channel Setting Pacing Amplitude: 0.75 V
Lead Channel Setting Pacing Amplitude: 1.625
Lead Channel Setting Pacing Pulse Width: 0.5 ms
Lead Channel Setting Sensing Sensitivity: 4 mV
Pulse Gen Model: 2272
Pulse Gen Serial Number: 8992729

## 2020-12-10 NOTE — Progress Notes (Signed)
Remote pacemaker transmission.   

## 2021-01-10 DIAGNOSIS — G8929 Other chronic pain: Secondary | ICD-10-CM | POA: Diagnosis not present

## 2021-01-10 DIAGNOSIS — M546 Pain in thoracic spine: Secondary | ICD-10-CM | POA: Diagnosis not present

## 2021-01-10 DIAGNOSIS — M5489 Other dorsalgia: Secondary | ICD-10-CM | POA: Diagnosis not present

## 2021-01-10 DIAGNOSIS — Z79899 Other long term (current) drug therapy: Secondary | ICD-10-CM | POA: Diagnosis not present

## 2021-01-10 DIAGNOSIS — M25511 Pain in right shoulder: Secondary | ICD-10-CM | POA: Diagnosis not present

## 2021-01-10 DIAGNOSIS — M25512 Pain in left shoulder: Secondary | ICD-10-CM | POA: Diagnosis not present

## 2021-02-02 DIAGNOSIS — Z23 Encounter for immunization: Secondary | ICD-10-CM | POA: Diagnosis not present

## 2021-02-07 DIAGNOSIS — D696 Thrombocytopenia, unspecified: Secondary | ICD-10-CM | POA: Diagnosis not present

## 2021-02-07 DIAGNOSIS — R7303 Prediabetes: Secondary | ICD-10-CM | POA: Diagnosis not present

## 2021-02-07 DIAGNOSIS — E7800 Pure hypercholesterolemia, unspecified: Secondary | ICD-10-CM | POA: Diagnosis not present

## 2021-02-07 DIAGNOSIS — Z952 Presence of prosthetic heart valve: Secondary | ICD-10-CM | POA: Diagnosis not present

## 2021-02-07 DIAGNOSIS — I480 Paroxysmal atrial fibrillation: Secondary | ICD-10-CM | POA: Diagnosis not present

## 2021-02-07 DIAGNOSIS — R1310 Dysphagia, unspecified: Secondary | ICD-10-CM | POA: Diagnosis not present

## 2021-02-07 DIAGNOSIS — N1831 Chronic kidney disease, stage 3a: Secondary | ICD-10-CM | POA: Diagnosis not present

## 2021-02-07 DIAGNOSIS — E46 Unspecified protein-calorie malnutrition: Secondary | ICD-10-CM | POA: Diagnosis not present

## 2021-02-07 DIAGNOSIS — E039 Hypothyroidism, unspecified: Secondary | ICD-10-CM | POA: Diagnosis not present

## 2021-02-07 DIAGNOSIS — I5030 Unspecified diastolic (congestive) heart failure: Secondary | ICD-10-CM | POA: Diagnosis not present

## 2021-02-07 DIAGNOSIS — I700 Atherosclerosis of aorta: Secondary | ICD-10-CM | POA: Diagnosis not present

## 2021-02-07 DIAGNOSIS — I442 Atrioventricular block, complete: Secondary | ICD-10-CM | POA: Diagnosis not present

## 2021-02-11 DIAGNOSIS — D696 Thrombocytopenia, unspecified: Secondary | ICD-10-CM | POA: Diagnosis not present

## 2021-02-11 DIAGNOSIS — E039 Hypothyroidism, unspecified: Secondary | ICD-10-CM | POA: Diagnosis not present

## 2021-02-11 DIAGNOSIS — R7303 Prediabetes: Secondary | ICD-10-CM | POA: Diagnosis not present

## 2021-02-11 DIAGNOSIS — E7800 Pure hypercholesterolemia, unspecified: Secondary | ICD-10-CM | POA: Diagnosis not present

## 2021-02-25 ENCOUNTER — Ambulatory Visit (INDEPENDENT_AMBULATORY_CARE_PROVIDER_SITE_OTHER): Payer: Medicare Other

## 2021-02-25 DIAGNOSIS — I441 Atrioventricular block, second degree: Secondary | ICD-10-CM | POA: Diagnosis not present

## 2021-02-25 LAB — CUP PACEART REMOTE DEVICE CHECK
Battery Remaining Longevity: 47 mo
Battery Remaining Percentage: 63 %
Battery Voltage: 2.99 V
Brady Statistic AP VP Percent: 48 %
Brady Statistic AP VS Percent: 1 %
Brady Statistic AS VP Percent: 46 %
Brady Statistic AS VS Percent: 1.2 %
Brady Statistic RA Percent Paced: 26 %
Brady Statistic RV Percent Paced: 93 %
Date Time Interrogation Session: 20221129024700
Implantable Lead Implant Date: 20190304
Implantable Lead Implant Date: 20190304
Implantable Lead Location: 753859
Implantable Lead Location: 753860
Implantable Pulse Generator Implant Date: 20190304
Lead Channel Impedance Value: 380 Ohm
Lead Channel Impedance Value: 550 Ohm
Lead Channel Pacing Threshold Amplitude: 0.625 V
Lead Channel Pacing Threshold Amplitude: 0.75 V
Lead Channel Pacing Threshold Pulse Width: 0.5 ms
Lead Channel Pacing Threshold Pulse Width: 0.5 ms
Lead Channel Sensing Intrinsic Amplitude: 11.1 mV
Lead Channel Sensing Intrinsic Amplitude: 2.2 mV
Lead Channel Setting Pacing Amplitude: 0.875
Lead Channel Setting Pacing Amplitude: 5 V
Lead Channel Setting Pacing Pulse Width: 0.5 ms
Lead Channel Setting Sensing Sensitivity: 4 mV
Pulse Gen Model: 2272
Pulse Gen Serial Number: 8992729

## 2021-03-06 NOTE — Progress Notes (Signed)
Remote pacemaker transmission.   

## 2021-04-11 ENCOUNTER — Other Ambulatory Visit (INDEPENDENT_AMBULATORY_CARE_PROVIDER_SITE_OTHER): Payer: Self-pay | Admitting: Vascular Surgery

## 2021-04-11 DIAGNOSIS — Z9889 Other specified postprocedural states: Secondary | ICD-10-CM

## 2021-04-11 DIAGNOSIS — Z8679 Personal history of other diseases of the circulatory system: Secondary | ICD-10-CM

## 2021-04-12 NOTE — Progress Notes (Signed)
MRN : [id number]  Jay [name] is a 88 y.o. (Oct 02, 1925) male who presents with chief complaint of check my AAA.  History of Present Illness:   The patient returns to the office for surveillance of an abdominal aortic aneurysm status post stent graft placement on 06/23/2016.   Procedure: Placement of a 28 x 12 x 12 Gore Excluder Endoprosthesis main body with a left 16 x 14 contralateral limb Placement of a 20 x 10 right iliac extender limb with a second 20 x 10 deployed in the right common iliac Coil embolization of the right internal iliac artery with 3 14 x 14 coils Placement of a 14 x 14 right external iliac extender  Patient denies abdominal pain or unusual or increased back pain, no other abdominal complaints.  Patient has a longstanding history of spinal stenosis and back pain and is already followed by pain management and has had multiple epidural injections.  No groin related complaints. No symptoms consistent with distal embolization No changes in claudication distance.   There have been no interval changes in his overall healthcare since his last visit.   Patient denies amaurosis fugax or TIA symptoms. There is no history of claudication or rest pain symptoms of the lower extremities. The patient denies angina or shortness of breath.   Duplex US of the aorta and iliac arteries shows a 6.5 cm AAA sac with no endoleak, no change in the sac compared to the previous study.    No outpatient medications have been marked as taking for the 04/14/21 encounter (Appointment) with Delana Meyer, Dolores Lory, MD.    Past Medical History:  Diagnosis Date   Allergic rhinitis    ,RAD wheezing   Cataracts, bilateral    Complication of anesthesia    hallucinations after aaa surgery and valve replacement   Diverticulosis    , ACBE, flex, in 2003   Dyslipidemia    Fracture of left clavicle    , Hx   Hearing loss    , Bilateral   History of kidney stones    h/o   Hypotension 11/2018    Hypothyroidism    Insomnia    OA (optic atrophy)    Osteoarthritis    Pelvic fracture (HCC)    , Hx   RBBB (right bundle branch block)    , seen on EKG   Varicose veins     Past Surgical History:  Procedure Laterality Date   ABDOMINAL AORTOGRAM N/A 06/22/2016   Procedure: Abdominal Aortogram;  Surgeon: Katha Cabal, MD;  Location: [place] [facility];  Service: Cardiovascular;  Laterality: N/A;   AORTIC INTERVENTION N/A 06/22/2016   Procedure: Aortic Intervention;  Surgeon: Katha Cabal, MD;  Location: [place] [facility];  Service: Cardiovascular;  Laterality: N/A;   AORTIC VALVE REPLACEMENT  2011   COW VALVE   CATARACT EXTRACTION W/PHACO Right 02/17/2016   Procedure: CATARACT EXTRACTION PHACO AND INTRAOCULAR LENS PLACEMENT ([place]);  Surgeon: Ronnell Freshwater, MD;  Location: [place];  Service: Ophthalmology;  Laterality: Right;  RIGHT   INGUINAL HERNIA REPAIR Bilateral    KYPHOPLASTY N/A 06/29/2019   Procedure: T12 KYPHOPLASTY;  Surgeon: Hessie Knows, MD;  Location: [facility];  Service: Orthopedics;  Laterality: N/A;   KYPHOPLASTY N/A 07/11/2019   Procedure: T11 KYPHOPLASTY;  Surgeon: Hessie Knows, MD;  Location: [facility];  Service: Orthopedics;  Laterality: N/A;   KYPHOPLASTY N/A 07/25/2019   Procedure: L1 KYPHOPLASTY;  Surgeon: Hessie Knows, MD;  Location: [facility];  Service: Orthopedics;  Laterality: N/A;   Orchectomy Right    PACEMAKER IMPLANT N/A 05/31/2017   Procedure: PACEMAKER IMPLANT;  Surgeon: Evans Lance, MD;  Location: [place] [facility];  Service: Cardiovascular;  Laterality: N/A;   TEMPORARY PACEMAKER N/A 05/31/2017   Procedure: TEMPORARY PACEMAKER;  Surgeon: Sherren Mocha, MD;  Location: [place] [facility];  Service: Cardiovascular;  Laterality: N/A;   THYROIDECTOMY      Social History Social History   Tobacco Use   Smoking status: Former    Packs/day: 0.50    Years: 30.00    Pack years: 15.00    Types: Cigarettes    Quit date:  03/30/1965    Years since quitting: 56.0   Smokeless tobacco: Current    Types: Chew  Vaping Use   Vaping Use: Never used  Substance Use Topics   Alcohol use: Yes    Alcohol/week: 1.0 standard drink    Types: 1 Shots of liquor per week   Drug use: No    Family History Family History  Problem Relation Age of Onset   Diabetes Mellitus II Mother     Allergies  Allergen Reactions   Aleve [Naproxen Sodium] Hives   Codeine Other (See Comments)    unknown   Penicillin G Benzathine     Pt doesn't remember reaction     REVIEW OF SYSTEMS (Negative unless checked)  Constitutional: [] Weight loss  [] Fever  [] Chills Cardiac: [] Chest pain   [] Chest pressure   [] Palpitations   [] Shortness of breath when laying flat   [] Shortness of breath with exertion. Vascular:  [] Pain in legs with walking   [] Pain in legs at rest  [] History of DVT   [] Phlebitis   [] Swelling in legs   [] Varicose veins   [] Non-healing ulcers Pulmonary:   [] Uses home oxygen   [] Productive cough   [] Hemoptysis   [] Wheeze  [] COPD   [] Asthma Neurologic:  [] Dizziness   [] Seizures   [] History of stroke   [] History of TIA  [] Aphasia   [] Vissual changes   [] Weakness or numbness in arm   [x] Weakness or numbness in leg Musculoskeletal:   [] Joint swelling   [x] Joint pain   [x] Low back pain Hematologic:  [] Easy bruising  [] Easy bleeding   [] Hypercoagulable state   [] Anemic Gastrointestinal:  [] Diarrhea   [] Vomiting  [] Gastroesophageal reflux/heartburn   [] Difficulty swallowing. Genitourinary:  [] Chronic kidney disease   [] Difficult urination  [] Frequent urination   [] Blood in urine Skin:  [] Rashes   [] Ulcers  Psychological:  [] History of anxiety   []  History of major depression.  Physical Examination  There were no vitals filed for this visit. There is no height or weight on file to calculate BMI. Gen: WD/WN, NAD Head: [place]/AT, No temporalis wasting.  Ear/Nose/Throat: Hearing grossly intact, nares w/o erythema or drainage Eyes:  PER, EOMI, sclera nonicteric.  Neck: Supple, no masses.  No bruit or JVD.  Pulmonary:  Good air movement, no audible wheezing, no use of accessory muscles.  Cardiac: RRR, normal S1, S2, no Murmurs. Vascular:   No carotid bruits Vessel Right Left  Radial Palpable Palpable  Carotid Palpable Palpable  Gastrointestinal: soft, non-distended. No guarding/no peritoneal signs.  Musculoskeletal: M/S 5/5 throughout.  No visible deformity.  Neurologic: CN 2-12 intact. Pain and light touch intact in extremities.  Symmetrical.  Speech is fluent. Motor exam as listed above. Psychiatric: Judgment intact, Mood & affect appropriate for pt's clinical situation. Dermatologic: No rashes or ulcers noted.  No changes consistent with  cellulitis.   CBC Lab Results  Component Value Date   WBC 5.3 05/31/2020   HGB 12.5 (L) 05/31/2020   HCT 40.9 05/31/2020   MCV 93.4 05/31/2020   PLT 137 (L) 05/31/2020    BMET    Component Value Date/Time   NA 139 05/31/2020 1429   K 4.3 05/31/2020 1429   CL 104 05/31/2020 1429   CO2 26 05/31/2020 1429   GLUCOSE 107 (H) 05/31/2020 1429   BUN 22 05/31/2020 1429   CREATININE 1.36 (H) 05/31/2020 1429   CALCIUM 8.6 (L) 05/31/2020 1429   GFRNONAA 49 (L) 05/31/2020 1429   GFRAA 60 (L) 06/23/2019 1601   CrCl cannot be calculated (Patient's most recent lab result is older than the maximum 21 days allowed.).  COAG Lab Results  Component Value Date   INR 1.14 05/30/2017   INR 1.16 06/22/2016   INR 1.12 03/30/2016    Radiology No results found.   Assessment/Plan 1. Ruptured infrarenal abdominal aortic aneurysm (AAA) Recommend: Patient is status post successful endovascular repair of the AAA.   No further intervention is required at this time.   No endoleak is detected and the aneurysm sac is stable.  The patient will continue antiplatelet therapy as prescribed as well as aggressive management of hyperlipidemia. Exercise is again strongly encouraged.    However, endografts require continued surveillance with ultrasound or CT scan. This is mandatory to detect any changes that allow repressurization of the aneurysm sac.  The patient is informed that this would be asymptomatic.  The patient is reminded that lifelong routine surveillance is a necessity with an endograft. Patient will continue to follow-up at 6 month intervals with ultrasound of the aorta.  - VAS US AORTA/IVC/ILIACS; Future  2. Coronary artery disease involving native coronary artery of native heart without angina pectoris Continue cardiac and antihypertensive medications as already ordered and reviewed, no changes at this time.  Continue statin as ordered and reviewed, no changes at this time  Nitrates PRN for chest pain   3. Essential hypertension Continue antihypertensive medications as already ordered, these medications have been reviewed and there are no changes at this time.   4. Persistent atrial fibrillation (HCC) Continue antiarrhythmia medications as already ordered, these medications have been reviewed and there are no changes at this time.  Continue anticoagulation as ordered by Cardiology Service   5. Dyslipidemia Continue statin as ordered and reviewed, no changes at this time    Hortencia Pilar, MD  04/12/2021 4:34 PM

## 2021-04-14 ENCOUNTER — Other Ambulatory Visit: Payer: Self-pay

## 2021-04-14 ENCOUNTER — Ambulatory Visit (INDEPENDENT_AMBULATORY_CARE_PROVIDER_SITE_OTHER): Payer: Medicare Other | Admitting: Vascular Surgery

## 2021-04-14 ENCOUNTER — Encounter (INDEPENDENT_AMBULATORY_CARE_PROVIDER_SITE_OTHER): Payer: Self-pay | Admitting: Vascular Surgery

## 2021-04-14 ENCOUNTER — Ambulatory Visit (INDEPENDENT_AMBULATORY_CARE_PROVIDER_SITE_OTHER): Payer: Medicare Other

## 2021-04-14 VITALS — BP 132/70 | HR 59 | Resp 14 | Ht 70.0 in | Wt 130.0 lb

## 2021-04-14 DIAGNOSIS — Z9889 Other specified postprocedural states: Secondary | ICD-10-CM | POA: Insufficient documentation

## 2021-04-14 DIAGNOSIS — I10 Essential (primary) hypertension: Secondary | ICD-10-CM | POA: Diagnosis not present

## 2021-04-14 DIAGNOSIS — I5032 Chronic diastolic (congestive) heart failure: Secondary | ICD-10-CM | POA: Insufficient documentation

## 2021-04-14 DIAGNOSIS — E039 Hypothyroidism, unspecified: Secondary | ICD-10-CM | POA: Insufficient documentation

## 2021-04-14 DIAGNOSIS — N183 Chronic kidney disease, stage 3 unspecified: Secondary | ICD-10-CM | POA: Insufficient documentation

## 2021-04-14 DIAGNOSIS — E7800 Pure hypercholesterolemia, unspecified: Secondary | ICD-10-CM | POA: Insufficient documentation

## 2021-04-14 DIAGNOSIS — H9190 Unspecified hearing loss, unspecified ear: Secondary | ICD-10-CM | POA: Insufficient documentation

## 2021-04-14 DIAGNOSIS — G4700 Insomnia, unspecified: Secondary | ICD-10-CM | POA: Insufficient documentation

## 2021-04-14 DIAGNOSIS — I7133 Infrarenal abdominal aortic aneurysm, ruptured: Secondary | ICD-10-CM

## 2021-04-14 DIAGNOSIS — M5136 Other intervertebral disc degeneration, lumbar region: Secondary | ICD-10-CM | POA: Diagnosis not present

## 2021-04-14 DIAGNOSIS — J309 Allergic rhinitis, unspecified: Secondary | ICD-10-CM | POA: Insufficient documentation

## 2021-04-14 DIAGNOSIS — Z79899 Other long term (current) drug therapy: Secondary | ICD-10-CM | POA: Diagnosis not present

## 2021-04-14 DIAGNOSIS — Z8679 Personal history of other diseases of the circulatory system: Secondary | ICD-10-CM | POA: Insufficient documentation

## 2021-04-14 DIAGNOSIS — M5416 Radiculopathy, lumbar region: Secondary | ICD-10-CM | POA: Diagnosis not present

## 2021-04-14 DIAGNOSIS — N182 Chronic kidney disease, stage 2 (mild): Secondary | ICD-10-CM | POA: Insufficient documentation

## 2021-04-14 DIAGNOSIS — M1990 Unspecified osteoarthritis, unspecified site: Secondary | ICD-10-CM | POA: Insufficient documentation

## 2021-04-14 DIAGNOSIS — Z952 Presence of prosthetic heart valve: Secondary | ICD-10-CM | POA: Insufficient documentation

## 2021-04-14 DIAGNOSIS — E785 Hyperlipidemia, unspecified: Secondary | ICD-10-CM | POA: Diagnosis not present

## 2021-04-14 DIAGNOSIS — R739 Hyperglycemia, unspecified: Secondary | ICD-10-CM | POA: Insufficient documentation

## 2021-04-14 DIAGNOSIS — R1310 Dysphagia, unspecified: Secondary | ICD-10-CM | POA: Insufficient documentation

## 2021-04-14 DIAGNOSIS — N1831 Chronic kidney disease, stage 3a: Secondary | ICD-10-CM | POA: Insufficient documentation

## 2021-04-14 DIAGNOSIS — M48062 Spinal stenosis, lumbar region with neurogenic claudication: Secondary | ICD-10-CM | POA: Diagnosis not present

## 2021-04-14 DIAGNOSIS — I2510 Atherosclerotic heart disease of native coronary artery without angina pectoris: Secondary | ICD-10-CM | POA: Diagnosis not present

## 2021-04-14 DIAGNOSIS — K5900 Constipation, unspecified: Secondary | ICD-10-CM | POA: Insufficient documentation

## 2021-04-14 DIAGNOSIS — I4819 Other persistent atrial fibrillation: Secondary | ICD-10-CM

## 2021-04-14 DIAGNOSIS — N200 Calculus of kidney: Secondary | ICD-10-CM | POA: Insufficient documentation

## 2021-04-14 DIAGNOSIS — I83813 Varicose veins of bilateral lower extremities with pain: Secondary | ICD-10-CM | POA: Insufficient documentation

## 2021-04-14 DIAGNOSIS — D696 Thrombocytopenia, unspecified: Secondary | ICD-10-CM | POA: Insufficient documentation

## 2021-04-14 DIAGNOSIS — F419 Anxiety disorder, unspecified: Secondary | ICD-10-CM | POA: Insufficient documentation

## 2021-04-14 DIAGNOSIS — R7303 Prediabetes: Secondary | ICD-10-CM | POA: Insufficient documentation

## 2021-04-14 DIAGNOSIS — I713 Abdominal aortic aneurysm, ruptured, unspecified: Secondary | ICD-10-CM | POA: Insufficient documentation

## 2021-04-14 DIAGNOSIS — E46 Unspecified protein-calorie malnutrition: Secondary | ICD-10-CM | POA: Insufficient documentation

## 2021-04-14 DIAGNOSIS — I480 Paroxysmal atrial fibrillation: Secondary | ICD-10-CM | POA: Insufficient documentation

## 2021-04-14 DIAGNOSIS — Z950 Presence of cardiac pacemaker: Secondary | ICD-10-CM | POA: Insufficient documentation

## 2021-05-27 ENCOUNTER — Ambulatory Visit (INDEPENDENT_AMBULATORY_CARE_PROVIDER_SITE_OTHER): Payer: Medicare Other

## 2021-05-27 DIAGNOSIS — I442 Atrioventricular block, complete: Secondary | ICD-10-CM | POA: Diagnosis not present

## 2021-05-27 LAB — CUP PACEART REMOTE DEVICE CHECK
Battery Remaining Longevity: 46 mo
Battery Remaining Percentage: 60 %
Battery Voltage: 2.99 V
Brady Statistic AP VP Percent: 48 %
Brady Statistic AP VS Percent: 1 %
Brady Statistic AS VP Percent: 46 %
Brady Statistic AS VS Percent: 1.2 %
Brady Statistic RA Percent Paced: 22 %
Brady Statistic RV Percent Paced: 93 %
Date Time Interrogation Session: 20230228020013
Implantable Lead Implant Date: 20190304
Implantable Lead Implant Date: 20190304
Implantable Lead Location: 753859
Implantable Lead Location: 753860
Implantable Pulse Generator Implant Date: 20190304
Lead Channel Impedance Value: 360 Ohm
Lead Channel Impedance Value: 530 Ohm
Lead Channel Pacing Threshold Amplitude: 0.625 V
Lead Channel Pacing Threshold Amplitude: 0.75 V
Lead Channel Pacing Threshold Pulse Width: 0.5 ms
Lead Channel Pacing Threshold Pulse Width: 0.5 ms
Lead Channel Sensing Intrinsic Amplitude: 2.2 mV
Lead Channel Sensing Intrinsic Amplitude: 8.1 mV
Lead Channel Setting Pacing Amplitude: 0.875
Lead Channel Setting Pacing Amplitude: 5 V
Lead Channel Setting Pacing Pulse Width: 0.5 ms
Lead Channel Setting Sensing Sensitivity: 4 mV
Pulse Gen Model: 2272
Pulse Gen Serial Number: 8992729

## 2021-06-03 NOTE — Progress Notes (Signed)
Remote pacemaker transmission.   

## 2021-06-10 DIAGNOSIS — E039 Hypothyroidism, unspecified: Secondary | ICD-10-CM | POA: Diagnosis not present

## 2021-06-10 DIAGNOSIS — D696 Thrombocytopenia, unspecified: Secondary | ICD-10-CM | POA: Diagnosis not present

## 2021-06-10 DIAGNOSIS — I5030 Unspecified diastolic (congestive) heart failure: Secondary | ICD-10-CM | POA: Diagnosis not present

## 2021-06-10 DIAGNOSIS — E46 Unspecified protein-calorie malnutrition: Secondary | ICD-10-CM | POA: Diagnosis not present

## 2021-06-10 DIAGNOSIS — I700 Atherosclerosis of aorta: Secondary | ICD-10-CM | POA: Diagnosis not present

## 2021-06-10 DIAGNOSIS — R54 Age-related physical debility: Secondary | ICD-10-CM | POA: Diagnosis not present

## 2021-06-10 DIAGNOSIS — Z8679 Personal history of other diseases of the circulatory system: Secondary | ICD-10-CM | POA: Diagnosis not present

## 2021-06-10 DIAGNOSIS — I480 Paroxysmal atrial fibrillation: Secondary | ICD-10-CM | POA: Diagnosis not present

## 2021-06-10 DIAGNOSIS — D6869 Other thrombophilia: Secondary | ICD-10-CM | POA: Diagnosis not present

## 2021-06-10 DIAGNOSIS — Z950 Presence of cardiac pacemaker: Secondary | ICD-10-CM | POA: Diagnosis not present

## 2021-06-10 DIAGNOSIS — E7800 Pure hypercholesterolemia, unspecified: Secondary | ICD-10-CM | POA: Diagnosis not present

## 2021-06-10 DIAGNOSIS — N1831 Chronic kidney disease, stage 3a: Secondary | ICD-10-CM | POA: Diagnosis not present

## 2021-07-15 DIAGNOSIS — N1831 Chronic kidney disease, stage 3a: Secondary | ICD-10-CM | POA: Diagnosis not present

## 2021-07-15 DIAGNOSIS — E039 Hypothyroidism, unspecified: Secondary | ICD-10-CM | POA: Diagnosis not present

## 2021-08-06 DIAGNOSIS — M48062 Spinal stenosis, lumbar region with neurogenic claudication: Secondary | ICD-10-CM | POA: Diagnosis not present

## 2021-08-06 DIAGNOSIS — M62830 Muscle spasm of back: Secondary | ICD-10-CM | POA: Diagnosis not present

## 2021-08-06 DIAGNOSIS — M5416 Radiculopathy, lumbar region: Secondary | ICD-10-CM | POA: Diagnosis not present

## 2021-08-06 DIAGNOSIS — Z79899 Other long term (current) drug therapy: Secondary | ICD-10-CM | POA: Diagnosis not present

## 2021-08-06 DIAGNOSIS — M5136 Other intervertebral disc degeneration, lumbar region: Secondary | ICD-10-CM | POA: Diagnosis not present

## 2021-08-26 ENCOUNTER — Ambulatory Visit (INDEPENDENT_AMBULATORY_CARE_PROVIDER_SITE_OTHER): Payer: Medicare Other

## 2021-08-26 DIAGNOSIS — I442 Atrioventricular block, complete: Secondary | ICD-10-CM

## 2021-08-26 IMAGING — CT CT ANGIO CHEST-ABD-PELV FOR DISSECTION W/ AND WO/W CM
2 of 8 series · 12 of 46 positions shown, 14 images · IV contrast (APPLIED)
Comparison: June 13, 2019

CLINICAL DATA: Left flank pain

EXAM:
CT ANGIOGRAPHY CHEST, ABDOMEN AND PELVIS
TECHNIQUE: Multidetector CT imaging through the chest, abdomen and pelvis was
performed using the standard protocol during bolus administration of
intravenous contrast. Multiplanar reconstructed images and MIPs were
obtained and reviewed to evaluate the vascular anatomy.
CONTRAST:  100mL OMNIPAQUE IOHEXOL 350 MG/ML SOLN

[Series 5: axial arterial · axial · arterial · 0.83mm/px · z∈[-435,+114]mm · 9 of 217 slices shown, 11 images]
[im 17/217  soft-tissue]
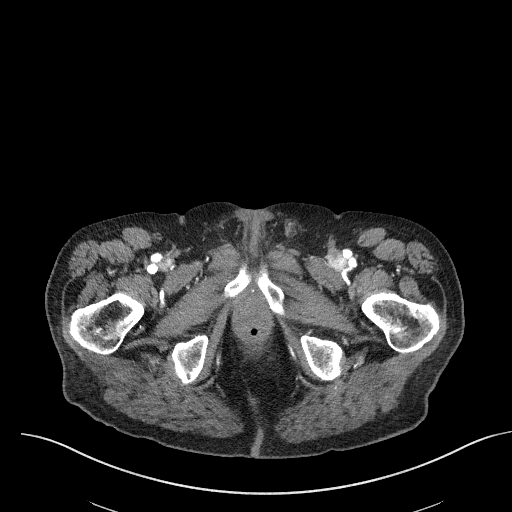
[im 17/217  bone]
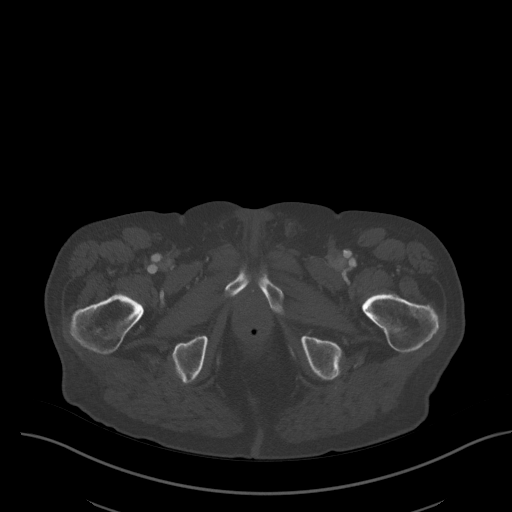
[im 50/217  soft-tissue]
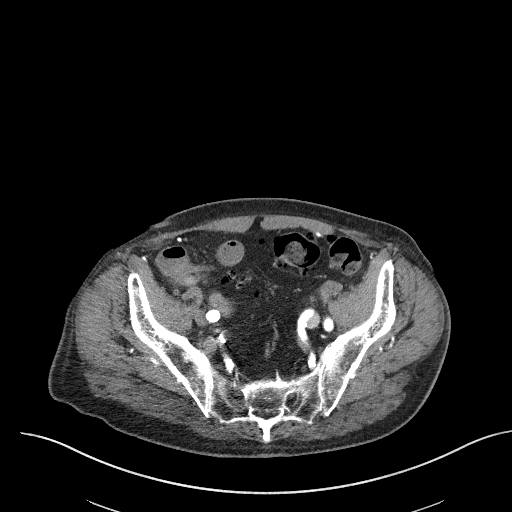
[im 67/217  soft-tissue]
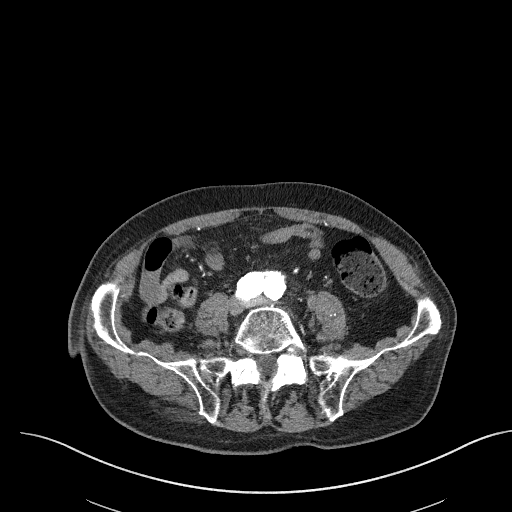
[im 84/217  soft-tissue]
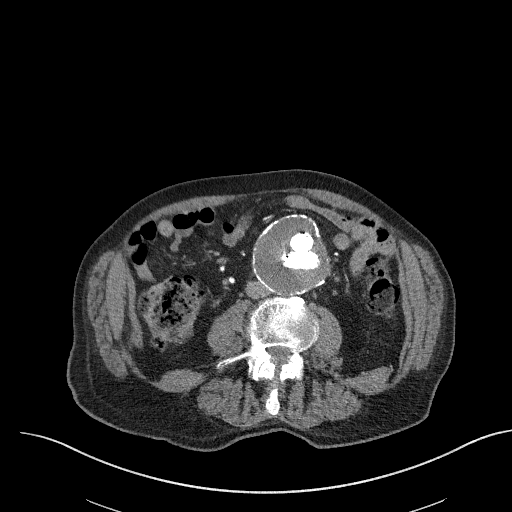
[im 117/217  soft-tissue]
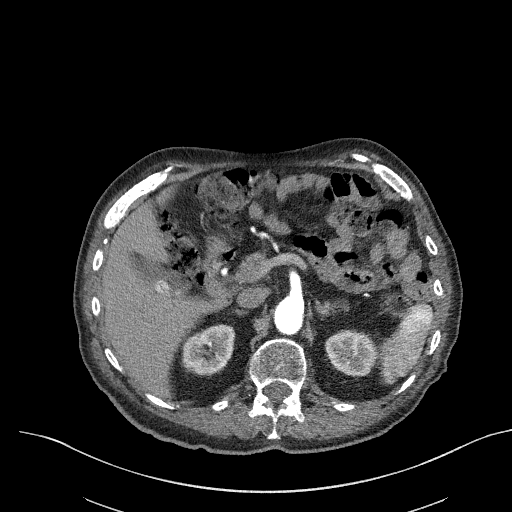
[im 133/217  soft-tissue]
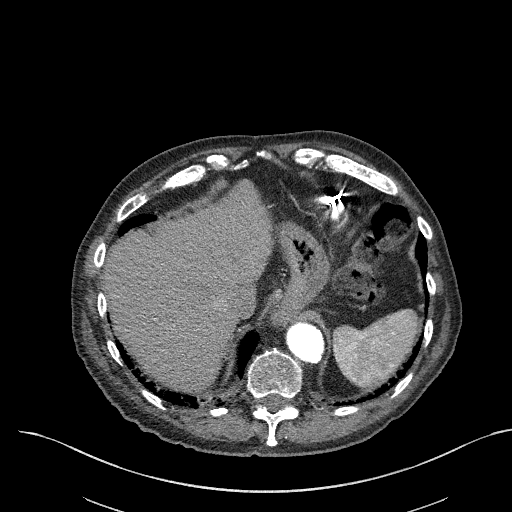
[im 150/217  soft-tissue]
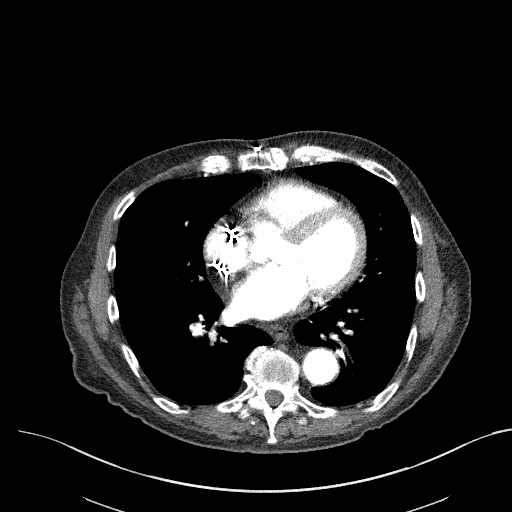
[im 183/217  soft-tissue]
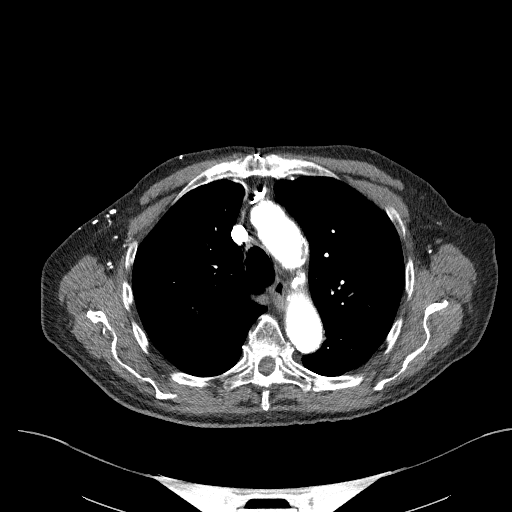
[im 200/217  soft-tissue]
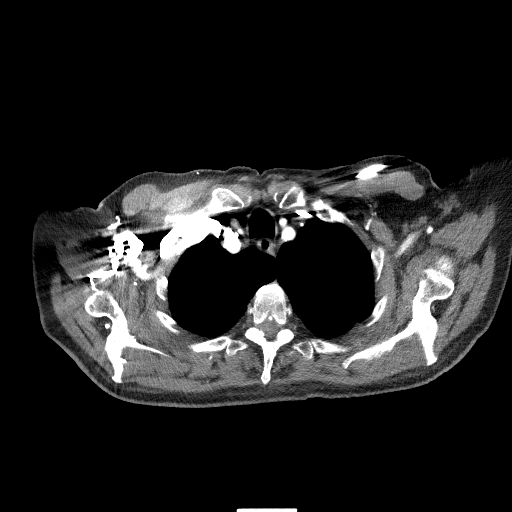
[im 200/217  bone]
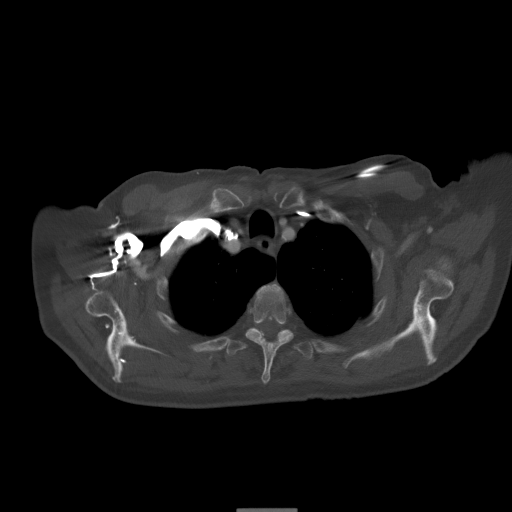

[Series 7: coronals · coronal · 0.72mm/px · 3 of 130 slices shown]
[im 33/130  soft-tissue]
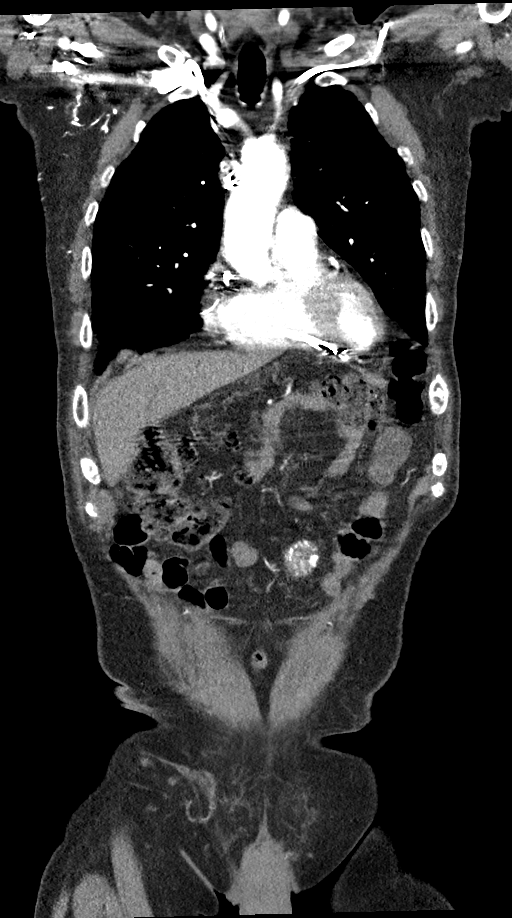
[im 65/130  soft-tissue]
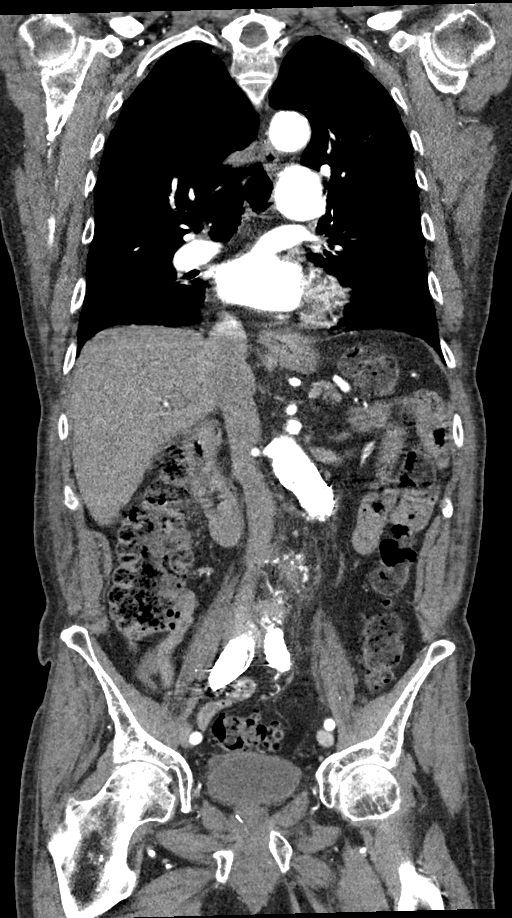
[im 97/130  soft-tissue]
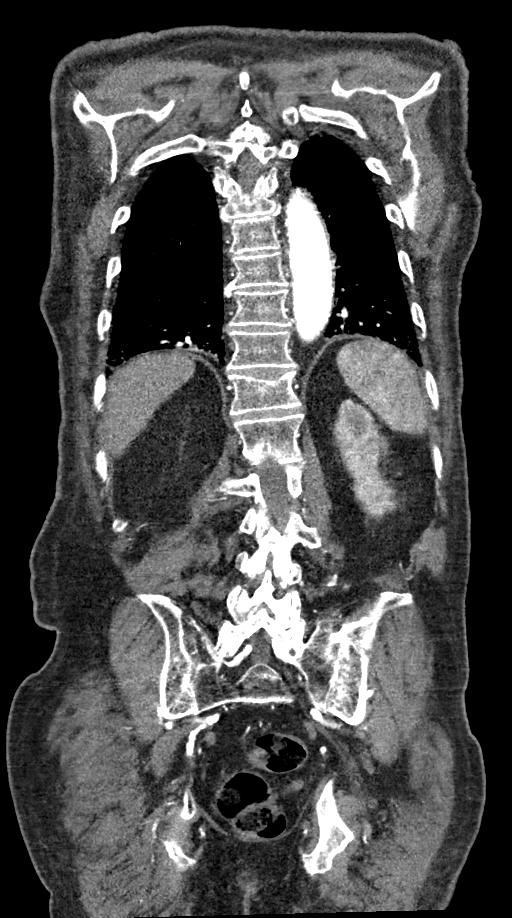

[12 of 46 positions shown; findings below may reference images not displayed]

FINDINGS: CTA CHEST FINDINGS

Cardiovascular: There is no evidence for thoracic aortic aneurysm or
dissection. Atherosclerotic disease is noted of the thoracic aorta.
The arch vessels are patent. The main pulmonary artery is dilated
measuring approximately 3.2 cm. There is no evidence for an acute
pulmonary embolism centrally. There is a dual chamber left-sided
pacemaker in place. The heart size is normal. Coronary artery
calcifications are noted.

Mediastinum/Nodes:

--No mediastinal or hilar lymphadenopathy.

--No axillary lymphadenopathy.

--No supraclavicular lymphadenopathy.

--Normal thyroid gland.

--there is a small hiatal hernia.

Lungs/Pleura: Moderate emphysematous changes are noted. There is
some atelectasis at the right lung base. There is no pneumothorax.
There is no pleural effusion.

Musculoskeletal: No chest wall abnormality. No acute or significant
osseous findings.

Review of the MIP images confirms the above findings.

CTA ABDOMEN AND PELVIS FINDINGS

VASCULAR

Aorta: The patient is status post prior SINGER. There is evidence for
a type 2 endoleak distally. The aneurysmal sac measures
approximately 6.7 by 6.3 cm (previously measuring 6.4 by 6.3 cm in
6767. The graft limbs are patent.

Celiac: Patent without evidence of aneurysm, dissection, vasculitis
or significant stenosis.

SMA: Patent without evidence of aneurysm, dissection, vasculitis or
significant stenosis.

Renals: There is atherosclerotic disease at the origin of both renal
arteries. Both renal arteries are patent however.

IMA: The IMA appears to be occluded.

Inflow: Patent without evidence of aneurysm, dissection, vasculitis
or significant stenosis. The patient is status post prior
embolization of the right internal iliac artery.

Veins: No obvious venous abnormality within the limitations of this
arterial phase study.

Review of the MIP images confirms the above findings.

NON-VASCULAR

Hepatobiliary: The liver is normal. Cholelithiasis without acute
inflammation.There is no biliary ductal dilation.

Pancreas: Normal contours without ductal dilatation. No
peripancreatic fluid collection.

Spleen: No splenic laceration or hematoma.

Adrenals/Urinary Tract:

--Adrenal glands: No adrenal hemorrhage.

--Right kidney/ureter: No hydronephrosis or perinephric hematoma.

--Left kidney/ureter: No hydronephrosis or perinephric hematoma.

--Urinary bladder: Multiple bladder diverticular noted. There is
some mild bladder wall thickening which may be secondary to chronic
outlet obstruction.

Stomach/Bowel:

--Stomach/Duodenum: No hiatal hernia or other gastric abnormality.
Normal duodenal course and caliber.

--Small bowel: No dilatation or inflammation.

--Colon: There is a large amount of stool throughout the colon.
There is scattered colonic diverticula without CT evidence for
diverticulitis.

--Appendix: Normal.

Lymphatic:

--No retroperitoneal lymphadenopathy.

--No mesenteric lymphadenopathy.

--No pelvic or inguinal lymphadenopathy.

Reproductive: The prostate gland is mildly enlarged.

Other: No ascites or free air. The abdominal wall is normal.

Musculoskeletal. There are old healed bilateral pelvic fractures.
There is a new acute compression fracture of the T12 vertebral body
resulting in approximately 35% height loss. This was not present on
the patient's recent CT from June 13, 2019.

Review of the MIP images confirms the above findings.
IMPRESSION: 1. New acute compression fracture of the T12 vertebral body with
approximately 35% height loss. This finding was not present on the
patient's recent CT from [REDACTED].
2. The infrarenal abdominal aortic aneurysm status Keiichirou Miura as
detailed above. There is a persistent type 2 endoleak with a stable
sized aneurysmal sac dating back to 6767.
3. There is cholelithiasis without secondary signs of acute
cholecystitis.
4. Multiple additional chronic findings as detailed above.
5. Aortic Atherosclerosis (8JZ1K-X85.5) and Emphysema (8JZ1K-MX4.J).

## 2021-08-27 LAB — CUP PACEART REMOTE DEVICE CHECK
Battery Remaining Longevity: 70 mo
Battery Remaining Percentage: 57 %
Battery Voltage: 2.99 V
Brady Statistic AP VP Percent: 45 %
Brady Statistic AP VS Percent: 4.2 %
Brady Statistic AS VP Percent: 39 %
Brady Statistic AS VS Percent: 7.9 %
Brady Statistic RA Percent Paced: 24 %
Brady Statistic RV Percent Paced: 88 %
Date Time Interrogation Session: 20230530020013
Implantable Lead Implant Date: 20190304
Implantable Lead Implant Date: 20190304
Implantable Lead Location: 753859
Implantable Lead Location: 753860
Implantable Pulse Generator Implant Date: 20190304
Lead Channel Impedance Value: 400 Ohm
Lead Channel Impedance Value: 560 Ohm
Lead Channel Pacing Threshold Amplitude: 0.625 V
Lead Channel Pacing Threshold Amplitude: 0.625 V
Lead Channel Pacing Threshold Pulse Width: 0.5 ms
Lead Channel Pacing Threshold Pulse Width: 0.5 ms
Lead Channel Sensing Intrinsic Amplitude: 2.8 mV
Lead Channel Sensing Intrinsic Amplitude: 8.5 mV
Lead Channel Setting Pacing Amplitude: 0.875
Lead Channel Setting Pacing Amplitude: 1.625
Lead Channel Setting Pacing Pulse Width: 0.5 ms
Lead Channel Setting Sensing Sensitivity: 4 mV
Pulse Gen Model: 2272
Pulse Gen Serial Number: 8992729

## 2021-09-11 NOTE — Progress Notes (Signed)
Remote pacemaker transmission.   

## 2021-10-15 ENCOUNTER — Other Ambulatory Visit (INDEPENDENT_AMBULATORY_CARE_PROVIDER_SITE_OTHER): Payer: Self-pay | Admitting: Vascular Surgery

## 2021-10-15 DIAGNOSIS — I714 Abdominal aortic aneurysm, without rupture, unspecified: Secondary | ICD-10-CM

## 2021-10-15 NOTE — Progress Notes (Signed)
MRN : [id number]  Jay [name] is a 88 y.o. (02-23-1926) male who presents with chief complaint of check circulation.  History of Present Illness:   The patient returns to the office for surveillance of an abdominal aortic aneurysm status post stent graft placement on 06/23/2016.    Procedure: Placement of a 28 x 12 x 12 Gore Excluder Endoprosthesis main body with a left 16 x 14 contralateral limb Placement of a 20 x 10 right iliac extender limb with a second 20 x 10 deployed in the right common iliac Coil embolization of the right internal iliac artery with 3 14 x 14 coils Placement of a 14 x 14 right external iliac extender   Patient denies abdominal pain or unusual or increased back pain, no other abdominal complaints.  Patient has a longstanding history of spinal stenosis and back pain and is already followed by pain management and has had multiple epidural injections.  No groin related complaints. No symptoms consistent with distal embolization No changes in claudication distance.    There have been no interval changes in his overall healthcare since his last visit.    Patient denies amaurosis fugax or TIA symptoms. There is no history of claudication or rest pain symptoms of the lower extremities. The patient denies angina or shortness of breath.    Duplex US of the aorta and iliac arteries shows a 6.5 cm AAA sac with no endoleak, no change in the sac compared to the previous study.   No outpatient medications have been marked as taking for the 10/16/21 encounter (Appointment) with Delana Meyer, Dolores Lory, MD.    Past Medical History:  Diagnosis Date   Allergic rhinitis    ,RAD wheezing   Cataracts, bilateral    Complication of anesthesia    hallucinations after aaa surgery and valve replacement   Diverticulosis    , ACBE, flex, in 2003   Dyslipidemia    Fracture of left clavicle    , Hx   Hearing loss    , Bilateral   History of kidney stones    h/o    Hypotension 11/2018   Hypothyroidism    Insomnia    OA (optic atrophy)    Osteoarthritis    Pelvic fracture (HCC)    , Hx   RBBB (right bundle branch block)    , seen on EKG   Varicose veins     Past Surgical History:  Procedure Laterality Date   ABDOMINAL AORTOGRAM N/A 06/22/2016   Procedure: Abdominal Aortogram;  Surgeon: Katha Cabal, MD;  Location: [place] [facility];  Service: Cardiovascular;  Laterality: N/A;   AORTIC INTERVENTION N/A 06/22/2016   Procedure: Aortic Intervention;  Surgeon: Katha Cabal, MD;  Location: [place] [facility];  Service: Cardiovascular;  Laterality: N/A;   AORTIC VALVE REPLACEMENT  2011   COW VALVE   CATARACT EXTRACTION W/PHACO Right 02/17/2016   Procedure: CATARACT EXTRACTION PHACO AND INTRAOCULAR LENS PLACEMENT ([place]);  Surgeon: Ronnell Freshwater, MD;  Location: [place];  Service: Ophthalmology;  Laterality: Right;  RIGHT   INGUINAL HERNIA REPAIR Bilateral    KYPHOPLASTY N/A 06/29/2019   Procedure: T12 KYPHOPLASTY;  Surgeon: Hessie Knows, MD;  Location: [facility];  Service: Orthopedics;  Laterality: N/A;   KYPHOPLASTY N/A 07/11/2019   Procedure: T11 KYPHOPLASTY;  Surgeon: Hessie Knows, MD;  Location: [facility];  Service: Orthopedics;  Laterality: N/A;  KYPHOPLASTY N/A 07/25/2019   Procedure: L1 KYPHOPLASTY;  Surgeon: Hessie Knows, MD;  Location: [facility];  Service: Orthopedics;  Laterality: N/A;   Orchectomy Right    PACEMAKER IMPLANT N/A 05/31/2017   Procedure: PACEMAKER IMPLANT;  Surgeon: Evans Lance, MD;  Location: [place] [facility];  Service: Cardiovascular;  Laterality: N/A;   TEMPORARY PACEMAKER N/A 05/31/2017   Procedure: TEMPORARY PACEMAKER;  Surgeon: Sherren Mocha, MD;  Location: [place] [facility];  Service: Cardiovascular;  Laterality: N/A;   THYROIDECTOMY      Social History Social History   Tobacco Use   Smoking status: Former    Packs/day: 0.50    Years: 30.00    Total pack years: 15.00     Types: Cigarettes    Quit date: 03/30/1965    Years since quitting: 56.5   Smokeless tobacco: Current    Types: Chew  Vaping Use   Vaping Use: Never used  Substance Use Topics   Alcohol use: Yes    Alcohol/week: 1.0 standard drink of alcohol    Types: 1 Shots of liquor per week   Drug use: No    Family History Family History  Problem Relation Age of Onset   Diabetes Mellitus II Mother     Allergies  Allergen Reactions   Aleve [Naproxen Sodium] Hives   Codeine Other (See Comments)    unknown   Penicillin G Benzathine     Pt doesn't remember reaction     REVIEW OF SYSTEMS (Negative unless checked)  Constitutional: '[]'$ Weight loss  '[]'$ Fever  '[]'$ Chills Cardiac: '[]'$ Chest pain   '[]'$ Chest pressure   '[]'$ Palpitations   '[]'$ Shortness of breath when laying flat   '[]'$ Shortness of breath with exertion. Vascular:  '[x]'$ Pain in legs with walking   '[]'$ Pain in legs at rest  '[]'$ History of DVT   '[]'$ Phlebitis   '[]'$ Swelling in legs   '[]'$ Varicose veins   '[]'$ Non-healing ulcers Pulmonary:   '[]'$ Uses home oxygen   '[]'$ Productive cough   '[]'$ Hemoptysis   '[]'$ Wheeze  '[]'$ COPD   '[]'$ Asthma Neurologic:  '[]'$ Dizziness   '[]'$ Seizures   '[]'$ History of stroke   '[]'$ History of TIA  '[]'$ Aphasia   '[]'$ Vissual changes   '[]'$ Weakness or numbness in arm   '[]'$ Weakness or numbness in leg Musculoskeletal:   '[]'$ Joint swelling   '[]'$ Joint pain   '[]'$ Low back pain Hematologic:  '[]'$ Easy bruising  '[]'$ Easy bleeding   '[]'$ Hypercoagulable state   '[]'$ Anemic Gastrointestinal:  '[]'$ Diarrhea   '[]'$ Vomiting  '[]'$ Gastroesophageal reflux/heartburn   '[]'$ Difficulty swallowing. Genitourinary:  '[]'$ Chronic kidney disease   '[]'$ Difficult urination  '[]'$ Frequent urination   '[]'$ Blood in urine Skin:  '[]'$ Rashes   '[]'$ Ulcers  Psychological:  '[]'$ History of anxiety   '[]'$  History of major depression.  Physical Examination  There were no vitals filed for this visit. There is no height or weight on file to calculate BMI. Gen: WD/WN, NAD Head: [place]/AT, No temporalis wasting.  Ear/Nose/Throat: Hearing grossly  intact, nares w/o erythema or drainage Eyes: PER, EOMI, sclera nonicteric.  Neck: Supple, no masses.  No bruit or JVD.  Pulmonary:  Good air movement, no audible wheezing, no use of accessory muscles.  Cardiac: RRR, normal S1, S2, no Murmurs. Vascular:  mild trophic changes, no open wounds Vessel Right Left  Radial Palpable Palpable  PT Not Palpable Not Palpable  DP Not Palpable Not Palpable  Gastrointestinal: soft, non-distended. No guarding/no peritoneal signs.  Musculoskeletal: M/S 5/5 throughout.  No visible deformity.  Neurologic: CN 2-12 intact. Pain and light touch intact in extremities.  Symmetrical.  Speech  is fluent. Motor exam as listed above. Psychiatric: Judgment intact, Mood & affect appropriate for pt's clinical situation. Dermatologic: No rashes or ulcers noted.  No changes consistent with cellulitis.   CBC Lab Results  Component Value Date   WBC 5.3 05/31/2020   HGB 12.5 (L) 05/31/2020   HCT 40.9 05/31/2020   MCV 93.4 05/31/2020   PLT 137 (L) 05/31/2020    BMET    Component Value Date/Time   NA 139 05/31/2020 1429   K 4.3 05/31/2020 1429   CL 104 05/31/2020 1429   CO2 26 05/31/2020 1429   GLUCOSE 107 (H) 05/31/2020 1429   BUN 22 05/31/2020 1429   CREATININE 1.36 (H) 05/31/2020 1429   CALCIUM 8.6 (L) 05/31/2020 1429   GFRNONAA 49 (L) 05/31/2020 1429   GFRAA 60 (L) 06/23/2019 1601   CrCl cannot be calculated (Patient's most recent lab result is older than the maximum 21 days allowed.).  COAG Lab Results  Component Value Date   INR 1.14 05/30/2017   INR 1.16 06/22/2016   INR 1.12 03/30/2016    Radiology No results found.   Assessment/Plan 1. Ruptured infrarenal abdominal aortic aneurysm (AAA) (HCC) Recommend:  Patient is status post successful endovascular repair of the AAA.   No further intervention is required at this time.   No endoleak is detected and the aneurysm sac is stable.  The patient will continue antiplatelet therapy as  prescribed as well as aggressive management of hyperlipidemia. Exercise is again strongly encouraged.   However, endografts require continued surveillance with ultrasound or CT scan. This is mandatory to detect any changes that allow repressurization of the aneurysm sac.  The patient is informed that this would be asymptomatic.  The patient is reminded that lifelong routine surveillance is a necessity with an endograft. Patient will continue to follow-up at the specified interval with ultrasound of the aorta.  - VAS US AORTA/IVC/ILIACS; Future  2. Coronary artery disease involving native coronary artery of native heart without angina pectoris Continue cardiac and antihypertensive medications as already ordered and reviewed, no changes at this time.  Continue statin as ordered and reviewed, no changes at this time  Nitrates PRN for chest pain   3. Essential hypertension Continue antihypertensive medications as already ordered, these medications have been reviewed and there are no changes at this time.   4. Persistent atrial fibrillation (HCC) Continue antiarrhythmia medications as already ordered, these medications have been reviewed and there are no changes at this time.  Continue anticoagulation as ordered by Cardiology Service   5. Dyslipidemia Continue statin as ordered and reviewed, no changes at this time   6. Abdominal aortic aneurysm (AAA) without rupture, unspecified part ([place]) See #1 - VAS [place] EVAR DUPLEX    Hortencia Pilar, MD  10/15/2021 7:45 AM

## 2021-10-16 ENCOUNTER — Ambulatory Visit (INDEPENDENT_AMBULATORY_CARE_PROVIDER_SITE_OTHER): Payer: Medicare Other

## 2021-10-16 ENCOUNTER — Encounter (INDEPENDENT_AMBULATORY_CARE_PROVIDER_SITE_OTHER): Payer: Self-pay | Admitting: Vascular Surgery

## 2021-10-16 ENCOUNTER — Ambulatory Visit (INDEPENDENT_AMBULATORY_CARE_PROVIDER_SITE_OTHER): Payer: Medicare Other | Admitting: Vascular Surgery

## 2021-10-16 VITALS — BP 148/75 | HR 69 | Resp 17 | Ht 69.0 in | Wt 120.0 lb

## 2021-10-16 DIAGNOSIS — I4819 Other persistent atrial fibrillation: Secondary | ICD-10-CM

## 2021-10-16 DIAGNOSIS — E785 Hyperlipidemia, unspecified: Secondary | ICD-10-CM | POA: Diagnosis not present

## 2021-10-16 DIAGNOSIS — I714 Abdominal aortic aneurysm, without rupture, unspecified: Secondary | ICD-10-CM

## 2021-10-16 DIAGNOSIS — I2510 Atherosclerotic heart disease of native coronary artery without angina pectoris: Secondary | ICD-10-CM | POA: Diagnosis not present

## 2021-10-16 DIAGNOSIS — I10 Essential (primary) hypertension: Secondary | ICD-10-CM | POA: Diagnosis not present

## 2021-10-16 DIAGNOSIS — I7133 Infrarenal abdominal aortic aneurysm, ruptured: Secondary | ICD-10-CM

## 2021-10-19 ENCOUNTER — Encounter (INDEPENDENT_AMBULATORY_CARE_PROVIDER_SITE_OTHER): Payer: Self-pay | Admitting: Vascular Surgery

## 2021-11-12 NOTE — Progress Notes (Signed)
Office Visit    Patient Name: Jay [name] Date of Encounter: 11/21/2021  Primary Care Provider:  Wenda Low, MD Primary Cardiologist:  None Primary Electrophysiologist: None  Chief Complaint    Jay [name] is a 88 y.o. male with PMH of s/p AVR repair 2011, RBBB, hypothyroidism, HLD, AAA s/p stent graft placement 2019, orthostatic hypotension, atrial fibrillation(on Eliquis), HTN, CHB s/p PPM 2019 who presents today for 1 year follow-up of CAD.  Past Medical History    Past Medical History:  Diagnosis Date   Allergic rhinitis    ,RAD wheezing   Cataracts, bilateral    Complication of anesthesia    hallucinations after aaa surgery and valve replacement   Diverticulosis    , ACBE, flex, in 2003   Dyslipidemia    Fracture of left clavicle    , Hx   Hearing loss    , Bilateral   History of kidney stones    h/o   Hypotension 11/2018   Hypothyroidism    Insomnia    OA (optic atrophy)    Osteoarthritis    Pelvic fracture (HCC)    , Hx   RBBB (right bundle branch block)    , seen on EKG   Varicose veins    Past Surgical History:  Procedure Laterality Date   ABDOMINAL AORTOGRAM N/A 06/22/2016   Procedure: Abdominal Aortogram;  Surgeon: Katha Cabal, MD;  Location: [place] [facility];  Service: Cardiovascular;  Laterality: N/A;   AORTIC INTERVENTION N/A 06/22/2016   Procedure: Aortic Intervention;  Surgeon: Katha Cabal, MD;  Location: [place] [facility];  Service: Cardiovascular;  Laterality: N/A;   AORTIC VALVE REPLACEMENT  2011   COW VALVE   CATARACT EXTRACTION W/PHACO Right 02/17/2016   Procedure: CATARACT EXTRACTION PHACO AND INTRAOCULAR LENS PLACEMENT ([place]);  Surgeon: Ronnell Freshwater, MD;  Location: [place];  Service: Ophthalmology;  Laterality: Right;  RIGHT   INGUINAL HERNIA REPAIR Bilateral    KYPHOPLASTY N/A 06/29/2019   Procedure: T12 KYPHOPLASTY;  Surgeon: Hessie Knows, MD;  Location: [facility];  Service:  Orthopedics;  Laterality: N/A;   KYPHOPLASTY N/A 07/11/2019   Procedure: T11 KYPHOPLASTY;  Surgeon: Hessie Knows, MD;  Location: [facility];  Service: Orthopedics;  Laterality: N/A;   KYPHOPLASTY N/A 07/25/2019   Procedure: L1 KYPHOPLASTY;  Surgeon: Hessie Knows, MD;  Location: [facility];  Service: Orthopedics;  Laterality: N/A;   Orchectomy Right    PACEMAKER IMPLANT N/A 05/31/2017   Procedure: PACEMAKER IMPLANT;  Surgeon: Evans Lance, MD;  Location: [place] [facility];  Service: Cardiovascular;  Laterality: N/A;   TEMPORARY PACEMAKER N/A 05/31/2017   Procedure: TEMPORARY PACEMAKER;  Surgeon: Sherren Mocha, MD;  Location: [place] [facility];  Service: Cardiovascular;  Laterality: N/A;   THYROIDECTOMY      Allergies  Allergies  Allergen Reactions   Aleve [Naproxen Sodium] Hives   Codeine Other (See Comments)    unknown   Penicillin G Benzathine     Pt doesn't remember reaction    History of Present Illness    Jay [name] is a 88 year old male with the above-mentioned past medical history who presents today for annual follow-up of coronary artery disease.  Jay [name] has been followed by Dr. Marlou Porch for cardiology and Dr. Lovena Le for electrophysiology.  He has nonobstructive ASCAD with 30% prox LAD and aneurysmal dilatation of the Lcx by remote cath. Jay [name] had aortic valve replacement 10/31/2009 with pericardial #23 Edwards prosthetic.  He was admitted to the ED on 03/2016 with complaint of dizziness and chest pain.  2D echo was obtained with EF of 60-65%, no RWMA, grade 2 DD with mild AR and stable prosthesis.  He was then seen on 05/2016 at [facility] with complaint of left flank pain.  CT scan revealed a ruptured AAA and patient was taken emergently to the OR for aneurysm repair with stent graft placement.  He tolerated procedure well and was discharged in stable condition.  He presented to the ED on 05/2017 with symptomatic bradycardia and found to have complete heart block.  He  underwent emergent PPM placement with SJM dual-chamber by Dr. Lovena Le.  He has had regular follow-ups with Dr. Marlou Porch and Dr. Lovena Le since that time and has been doing well.  He was diagnosed with atrial fibrillation on 04/2020 following a scheduled remote device transmission showing ongoing episode.  He is currently on Eliquis for CHA2DS2-VASc score 4.  He is currently followed by atrial fibrillation clinic.  Jay [name] presents for follow-up today with his daughter.  He is extremely HOH and daughter is primary provider of history.  She states that he recently suffered a fall but did not strike his head.  He is been having poor p.o. intake and is not drinking enough water daily.  He has lost a total of 5 pounds since his last appointment.  She states that he is extremely finicky and eats very sparingly.  His blood pressure today was 90/50.  She reports that they are not recording at home so we are unaware of history.  Patient denies chest pain, palpitations, dyspnea, PND, orthopnea, nausea, vomiting, dizziness, syncope, edema, weight gain, or early satiety.  Home Medications    Current Outpatient Medications  Medication Sig Dispense Refill   acetaminophen (TYLENOL) 650 MG CR tablet Take 1,300 mg by mouth every 8 (eight) hours as needed for pain.     albuterol (PROVENTIL HFA;VENTOLIN HFA) 108 (90 BASE) MCG/ACT inhaler Inhale 2 puffs into the lungs every 6 (six) hours as needed for wheezing or shortness of breath.     apixaban (ELIQUIS) 5 MG TABS tablet Take 1 tablet (5 mg total) by mouth 2 (two) times daily. 60 tablet 6   atorvastatin (LIPITOR) 10 MG tablet Take 10 mg by mouth daily at 6 PM.      [employer] PURELAX 17 GM/SCOOP powder Take by mouth.     furosemide (LASIX) 20 MG tablet Take 1 tablet (20 mg total) by mouth daily. Please call and schedule an appointment for further refills 1st attempt 90 tablet 0   HYDROcodone-acetaminophen (NORCO) 7.5-325 MG tablet Take 0.5-1 tablets by mouth 2 (two) times  daily as needed.     levothyroxine (SYNTHROID, LEVOTHROID) 88 MCG tablet Take 88 mcg by mouth daily before breakfast.     mirtazapine (REMERON) 15 MG tablet Take 1 tablet by mouth daily.     saw palmetto 500 MG capsule Take 500 mg by mouth daily.     No current facility-administered medications for this visit.     Review of Systems  Please see the history of present illness.    (+) Dizziness (+) Back pain  All other systems reviewed and are otherwise negative except as noted above.  Physical Exam    Wt Readings from Last 3 Encounters:  11/21/21 121 lb 6.4 oz (55.1 kg)  10/16/21 120 lb (54.4 kg)  04/14/21 130 lb (59 kg)   VS: Vitals:   11/21/21 1344  BP: (!) 90/50  Pulse: 72  SpO2: 95%  ,Body mass index is 17.93 kg/m.  Constitutional:      Appearance: Elderly appearance. Not in distress.  Neck:     Vascular: JVD normal.  Pulmonary:     Effort: Pulmonary effort is normal.     Breath sounds: No wheezing. No rales. Diminished in the bases Cardiovascular:     Normal rate. Regular rhythm. Normal S1. Normal S2.      Murmurs: There is no murmur.  Edema:    Peripheral edema absent.  Abdominal:     Palpations: Abdomen is soft non tender. There is no hepatomegaly.  Skin:    General: Skin is warm and dry.  Neurological:     General: No focal deficit present.     Mental Status: Alert and oriented to person, place and time.     Cranial Nerves: Cranial nerves are intact.  EKG/LABS/Other Studies Reviewed    ECG personally reviewed by me today -sinus rhythm with RBBB with nonspecific T wave abnormalities consistent with previous EKG  Risk Assessment/Calculations:    CHA2DS2-VASc Score = 4   This indicates a 4.8% annual risk of stroke. The patient's score is based upon: CHF History: 0 HTN History: 1 Diabetes History: 0 Stroke History: 0 Vascular Disease History: 1 Age Score: 2 Gender Score: 0           Lab Results  Component Value Date   WBC 5.3 05/31/2020    HGB 12.5 (L) 05/31/2020   HCT 40.9 05/31/2020   MCV 93.4 05/31/2020   PLT 137 (L) 05/31/2020   Lab Results  Component Value Date   CREATININE 1.36 (H) 05/31/2020   BUN 22 05/31/2020   NA 139 05/31/2020   K 4.3 05/31/2020   CL 104 05/31/2020   CO2 26 05/31/2020   Lab Results  Component Value Date   ALT 18 06/13/2019   AST 29 06/13/2019   ALKPHOS 91 06/13/2019   BILITOT 0.9 06/13/2019   Lab Results  Component Value Date   CHOL 135 03/30/2016   HDL 38 (L) 03/30/2016   LDLCALC 83 03/30/2016   TRIG 68 03/30/2016   CHOLHDL 3.6 03/30/2016    Lab Results  Component Value Date   HGBA1C 5.4 03/30/2016    Assessment & Plan    1.  Aortic valve replacement: -2D echo last completed 2018 with EF of 60-65%, no RWMA, grade 2 DD with mild AR and stable prosthesis.  -SBE prophylaxis discussed with patient regarding -Patient euvolemic today on examination  2.  Persistent atrial fibrillation: -Patient is rate controlled today at 72 bpm and sinus rhythm per EKG -CBC and BMET for Eliquis -Continue Eliquis 5 mg twice daily  3.  Complete heart block: -s/p [facility] PPM by Dr. Lovena Le in 2019 -Paceart report with appropriate histograms in June/2023  4.  HTN: -Blood pressure today was 90/50 primarily due to poor p.o. intake -Patient was advised to hold Lasix for 1 week and check blood pressures at home -Patient will report if blood pressures have improved with increased p.o. intake  5.  AAA repair: -s/p  stent graft placement 2019 -Followed by Vascular   Disposition: Follow-up with None or APP in 12 months    Medication Adjustments/Labs and Tests Ordered: Current medicines are reviewed at length with the patient today.  Concerns regarding medicines are outlined above.   Signed, Mable Fill, Marissa Nestle, NP 11/21/2021, 3:05 PM [place]

## 2021-11-21 ENCOUNTER — Ambulatory Visit (INDEPENDENT_AMBULATORY_CARE_PROVIDER_SITE_OTHER): Payer: Medicare Other | Admitting: Nurse Practitioner

## 2021-11-21 ENCOUNTER — Encounter: Payer: Self-pay | Admitting: Nurse Practitioner

## 2021-11-21 VITALS — BP 90/50 | HR 72 | Ht 69.0 in | Wt 121.4 lb

## 2021-11-21 DIAGNOSIS — I442 Atrioventricular block, complete: Secondary | ICD-10-CM | POA: Diagnosis not present

## 2021-11-21 DIAGNOSIS — Z952 Presence of prosthetic heart valve: Secondary | ICD-10-CM | POA: Diagnosis not present

## 2021-11-21 DIAGNOSIS — I4819 Other persistent atrial fibrillation: Secondary | ICD-10-CM | POA: Diagnosis not present

## 2021-11-21 DIAGNOSIS — E7800 Pure hypercholesterolemia, unspecified: Secondary | ICD-10-CM

## 2021-11-21 NOTE — Patient Instructions (Signed)
Medication Instructions:  Your physician recommends that you continue on your current medications as directed. Please refer to the Current Medication list given to you today.  Hold the lasix for 1 week, monitor your blood pressure during that time and call with those readings.  *If you need a refill on your cardiac medications before your next appointment, please call your pharmacy*   Lab Work: TODAY:  BMET & CBC  If you have labs (blood work) drawn today and your tests are completely normal, you will receive your results only by: [place] (if you have MyChart) OR A paper copy in the mail If you have any lab test that is abnormal or we need to change your treatment, we will call you to review the results.   Testing/Procedures: None ordered   Follow-Up: At [facility], you and your health needs are our priority.  As part of our continuing mission to provide you with exceptional heart care, we have created designated Provider Care Teams.  These Care Teams include your primary Cardiologist (physician) and Advanced Practice Providers (APPs -  Physician Assistants and Nurse Practitioners) who all work together to provide you with the care you need, when you need it.  We recommend signing up for the patient portal called "MyChart".  Sign up information is provided on this After Visit Summary.  MyChart is used to connect with patients for Virtual Visits (Telemedicine).  Patients are able to view lab/test results, encounter notes, upcoming appointments, etc.  Non-urgent messages can be sent to your provider as well.   To learn more about what you can do with MyChart, go to [email or web address].    Your next appointment:   1 year(s)  The format for your next appointment:   In Person  Provider:   Candee Furbish, MD     Other Instructions   Important Information About Sugar

## 2021-11-22 LAB — CBC
Hematocrit: 32.8 % — ABNORMAL LOW (ref 37.5–51.0)
Hemoglobin: 10.9 g/dL — ABNORMAL LOW (ref 13.0–17.7)
MCH: 29.5 pg (ref 26.6–33.0)
MCHC: 33.2 g/dL (ref 31.5–35.7)
MCV: 89 fL (ref 79–97)
Platelets: 163 10*3/uL (ref 150–450)
RBC: 3.7 x10E6/uL — ABNORMAL LOW (ref 4.14–5.80)
RDW: 13.5 % (ref 11.6–15.4)
WBC: 5.4 10*3/uL (ref 3.4–10.8)

## 2021-11-22 LAB — BASIC METABOLIC PANEL
BUN/Creatinine Ratio: 19 (ref 10–24)
BUN: 26 mg/dL (ref 10–36)
CO2: 25 mmol/L (ref 20–29)
Calcium: 8.9 mg/dL (ref 8.6–10.2)
Chloride: 101 mmol/L (ref 96–106)
Creatinine, Ser: 1.34 mg/dL — ABNORMAL HIGH (ref 0.76–1.27)
Glucose: 110 mg/dL — ABNORMAL HIGH (ref 70–99)
Potassium: 3.6 mmol/L (ref 3.5–5.2)
Sodium: 142 mmol/L (ref 134–144)
eGFR: 49 mL/min/{1.73_m2} — ABNORMAL LOW (ref >59)

## 2021-11-25 ENCOUNTER — Ambulatory Visit (INDEPENDENT_AMBULATORY_CARE_PROVIDER_SITE_OTHER): Payer: Medicare Other

## 2021-11-25 DIAGNOSIS — I442 Atrioventricular block, complete: Secondary | ICD-10-CM | POA: Diagnosis not present

## 2021-11-25 LAB — CUP PACEART REMOTE DEVICE CHECK
Battery Remaining Longevity: 66 mo
Battery Remaining Percentage: 55 %
Battery Voltage: 2.98 V
Brady Statistic AP VP Percent: 42 %
Brady Statistic AP VS Percent: 9.6 %
Brady Statistic AS VP Percent: 33 %
Brady Statistic AS VS Percent: 13 %
Brady Statistic RA Percent Paced: 28 %
Brady Statistic RV Percent Paced: 81 %
Date Time Interrogation Session: 20230829020013
Implantable Lead Implant Date: 20190304
Implantable Lead Implant Date: 20190304
Implantable Lead Location: 753859
Implantable Lead Location: 753860
Implantable Pulse Generator Implant Date: 20190304
Lead Channel Impedance Value: 380 Ohm
Lead Channel Impedance Value: 560 Ohm
Lead Channel Pacing Threshold Amplitude: 0.625 V
Lead Channel Pacing Threshold Amplitude: 0.625 V
Lead Channel Pacing Threshold Pulse Width: 0.5 ms
Lead Channel Pacing Threshold Pulse Width: 0.5 ms
Lead Channel Sensing Intrinsic Amplitude: 2 mV
Lead Channel Sensing Intrinsic Amplitude: 7.5 mV
Lead Channel Setting Pacing Amplitude: 0.875
Lead Channel Setting Pacing Amplitude: 1.625
Lead Channel Setting Pacing Pulse Width: 0.5 ms
Lead Channel Setting Sensing Sensitivity: 4 mV
Pulse Gen Model: 2272
Pulse Gen Serial Number: 8992729

## 2021-12-19 NOTE — Progress Notes (Signed)
Remote pacemaker transmission.   

## 2022-01-13 DIAGNOSIS — M5136 Other intervertebral disc degeneration, lumbar region: Secondary | ICD-10-CM | POA: Diagnosis not present

## 2022-01-13 DIAGNOSIS — M48062 Spinal stenosis, lumbar region with neurogenic claudication: Secondary | ICD-10-CM | POA: Diagnosis not present

## 2022-01-13 DIAGNOSIS — Z79899 Other long term (current) drug therapy: Secondary | ICD-10-CM | POA: Diagnosis not present

## 2022-01-13 DIAGNOSIS — M5416 Radiculopathy, lumbar region: Secondary | ICD-10-CM | POA: Diagnosis not present

## 2022-02-24 ENCOUNTER — Ambulatory Visit (INDEPENDENT_AMBULATORY_CARE_PROVIDER_SITE_OTHER): Payer: Medicare Other

## 2022-02-24 DIAGNOSIS — I442 Atrioventricular block, complete: Secondary | ICD-10-CM | POA: Diagnosis not present

## 2022-02-25 LAB — CUP PACEART REMOTE DEVICE CHECK
Battery Remaining Longevity: 62 mo
Battery Remaining Percentage: 52 %
Battery Voltage: 2.98 V
Brady Statistic AP VP Percent: 41 %
Brady Statistic AP VS Percent: 11 %
Brady Statistic AS VP Percent: 30 %
Brady Statistic AS VS Percent: 15 %
Brady Statistic RA Percent Paced: 31 %
Brady Statistic RV Percent Paced: 78 %
Date Time Interrogation Session: 20231128020014
Implantable Lead Connection Status: 753985
Implantable Lead Connection Status: 753985
Implantable Lead Implant Date: 20190304
Implantable Lead Implant Date: 20190304
Implantable Lead Location: 753859
Implantable Lead Location: 753860
Implantable Pulse Generator Implant Date: 20190304
Lead Channel Impedance Value: 400 Ohm
Lead Channel Impedance Value: 590 Ohm
Lead Channel Pacing Threshold Amplitude: 0.75 V
Lead Channel Pacing Threshold Amplitude: 0.75 V
Lead Channel Pacing Threshold Pulse Width: 0.5 ms
Lead Channel Pacing Threshold Pulse Width: 0.5 ms
Lead Channel Sensing Intrinsic Amplitude: 2.7 mV
Lead Channel Sensing Intrinsic Amplitude: 8.2 mV
Lead Channel Setting Pacing Amplitude: 1 V
Lead Channel Setting Pacing Amplitude: 1.75 V
Lead Channel Setting Pacing Pulse Width: 0.5 ms
Lead Channel Setting Sensing Sensitivity: 4 mV
Pulse Gen Model: 2272
Pulse Gen Serial Number: 8992729

## 2022-03-25 NOTE — Progress Notes (Signed)
Remote pacemaker transmission.   

## 2022-05-26 ENCOUNTER — Ambulatory Visit: Payer: Medicare Other

## 2022-05-26 DIAGNOSIS — I442 Atrioventricular block, complete: Secondary | ICD-10-CM | POA: Diagnosis not present

## 2022-05-26 LAB — CUP PACEART REMOTE DEVICE CHECK
Battery Remaining Longevity: 60 mo
Battery Remaining Percentage: 50 %
Battery Voltage: 2.98 V
Brady Statistic AP VP Percent: 40 %
Brady Statistic AP VS Percent: 12 %
Brady Statistic AS VP Percent: 28 %
Brady Statistic AS VS Percent: 17 %
Brady Statistic RA Percent Paced: 33 %
Brady Statistic RV Percent Paced: 76 %
Date Time Interrogation Session: 20240227020013
Implantable Lead Connection Status: 753985
Implantable Lead Connection Status: 753985
Implantable Lead Implant Date: 20190304
Implantable Lead Implant Date: 20190304
Implantable Lead Location: 753859
Implantable Lead Location: 753860
Implantable Pulse Generator Implant Date: 20190304
Lead Channel Impedance Value: 390 Ohm
Lead Channel Impedance Value: 610 Ohm
Lead Channel Pacing Threshold Amplitude: 0.625 V
Lead Channel Pacing Threshold Amplitude: 0.75 V
Lead Channel Pacing Threshold Pulse Width: 0.5 ms
Lead Channel Pacing Threshold Pulse Width: 0.5 ms
Lead Channel Sensing Intrinsic Amplitude: 2 mV
Lead Channel Sensing Intrinsic Amplitude: 8.5 mV
Lead Channel Setting Pacing Amplitude: 0.875
Lead Channel Setting Pacing Amplitude: 1.75 V
Lead Channel Setting Pacing Pulse Width: 0.5 ms
Lead Channel Setting Sensing Sensitivity: 4 mV
Pulse Gen Model: 2272
Pulse Gen Serial Number: 8992729

## 2022-07-01 NOTE — Progress Notes (Signed)
Remote pacemaker transmission.   

## 2022-08-25 ENCOUNTER — Ambulatory Visit (INDEPENDENT_AMBULATORY_CARE_PROVIDER_SITE_OTHER): Payer: Medicare Other

## 2022-08-25 DIAGNOSIS — I442 Atrioventricular block, complete: Secondary | ICD-10-CM | POA: Diagnosis not present

## 2022-08-26 LAB — CUP PACEART REMOTE DEVICE CHECK
Battery Remaining Longevity: 41 mo
Battery Remaining Percentage: 47 %
Battery Voltage: 2.96 V
Brady Statistic AP VP Percent: 38 %
Brady Statistic AP VS Percent: 15 %
Brady Statistic AS VP Percent: 26 %
Brady Statistic AS VS Percent: 19 %
Brady Statistic RA Percent Paced: 34 %
Brady Statistic RV Percent Paced: 72 %
Date Time Interrogation Session: 20240528035846
Implantable Lead Connection Status: 753985
Implantable Lead Connection Status: 753985
Implantable Lead Implant Date: 20190304
Implantable Lead Implant Date: 20190304
Implantable Lead Location: 753859
Implantable Lead Location: 753860
Implantable Pulse Generator Implant Date: 20190304
Lead Channel Impedance Value: 360 Ohm
Lead Channel Impedance Value: 550 Ohm
Lead Channel Pacing Threshold Amplitude: 0.625 V
Lead Channel Pacing Threshold Amplitude: 3.5 V
Lead Channel Pacing Threshold Pulse Width: 0.5 ms
Lead Channel Pacing Threshold Pulse Width: 0.5 ms
Lead Channel Sensing Intrinsic Amplitude: 2.6 mV
Lead Channel Sensing Intrinsic Amplitude: 9.3 mV
Lead Channel Setting Pacing Amplitude: 1.625
Lead Channel Setting Pacing Amplitude: 3.75 V
Lead Channel Setting Pacing Pulse Width: 0.5 ms
Lead Channel Setting Sensing Sensitivity: 4 mV
Pulse Gen Model: 2272
Pulse Gen Serial Number: 8992729

## 2022-09-08 ENCOUNTER — Ambulatory Visit: Payer: Medicare Other | Admitting: Internal Medicine

## 2022-09-17 NOTE — Progress Notes (Signed)
Electrophysiology Office Note:   Date:  09/18/2022  ID:  Doreen Salvage, DOB 1925-07-26, MRN [id number]  Primary Cardiologist: None Electrophysiologist: Lewayne Bunting, MD      History of Present Illness:   Jay [name] is a 89 y.o. male with h/o hypothyroidism, diverticulosis, HTN, HLD, orthostatic hypotension, RBBB, AAA s/p stent graft placement, AVR (bioprosthetic), & CHB s/p PPM seen today for routine electrophysiology followup.   Since last being seen in our clinic the patient reports doing fairly well. He is accompanied by his daughter. She reports he fell once at home and had a skin tear on his arm. He has not fallen in a long time. Continues to live independently with significant family support (at the house frequently, monitors, bathing, & food prep for him). Daughter reports his mind remains "very sharp" & he is very independent.   He denies chest pain, palpitations, dyspnea, PND, orthopnea, nausea, vomiting, dizziness, syncope, LE edema, weight gain, or early satiety.   Review of systems complete and found to be negative unless listed in HPI.   EP Information / Studies Reviewed:    Studies ECHO 2019 > LVEF 60-65%, no RWMA, AV block, valve functioning normally, PASP 64  Arrhythmia / Device  05/2017 SJM Dual Chamber [facility]  09/18/22 Device interrogation > AS/VS, Atrial Flutter, battery 4.1 yrs, leads stable, EGM's reviewed with intermittent AFib/Flutter, some EGM's were brief ATach, total burden 34% with current episode ongoing since 08/15/22 with controlled ventricular response, initial interrogation showed V output at 5V, likely secondary to AF/flutter unable to run autocapture, turned off auto capture, output programmed to 2.5V.   AAD / Anticoagulation  Eliquis    Risk Assessment/Calculations:    CHA2DS2-VASc Score = 4   This indicates a 4.8% annual risk of stroke. The patient's score is based upon: CHF History: 0 HTN History: 1 Diabetes History: 0 Stroke History:  0 Vascular Disease History: 1 Age Score: 2 Gender Score: 0             Physical Exam:   VS:  BP 114/62   Pulse 94   Ht 5\' 9"  (1.753 m)   Wt 113 lb (51.3 kg)   SpO2 99%   BMI 16.69 kg/m    Wt Readings from Last 3 Encounters:  09/18/22 113 lb (51.3 kg)  11/21/21 121 lb 6.4 oz (55.1 kg)  10/16/21 120 lb (54.4 kg)     GEN: Well nourished, well developed in no acute distress NECK: No JVD; No carotid bruits CARDIAC: Irregularly irregular rate and rhythm, no murmurs, rubs, gallops. Pacemaker site well healed, no edema, erythema or tethering.  RESPIRATORY:  Clear to auscultation without rales, wheezing or rhonchi  ABDOMEN: Soft, non-tender, non-distended EXTREMITIES:  No edema; No deformity   ASSESSMENT AND PLAN:    CHB s/p PPM  -device interrogated, functioning normally. See adjustments above.    Paroxysmal Atrial Fibrillation  CHA2DSVasc 4. Aborted rhythm control strategies in 2022.  -continue eliquis, reduce dose to 2.5mg  BID for weight and age -update BMP, CBC  -atrial flutter on interrogation with no obvious associated symptoms, elected not to try and pace terminate. Overall burden 34% as above, actual is less. Rates controlled.  -long discussion with daughter regarding fall risks, has had rare falls, risk/benefits of anticoagulation discussed in detail. Continue AC for now. Advised to contact clinic if fall frequency increases. Pt has excellent family support.   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 Ladona Ridgel in 12  months   Signed, Canary Brim, MSN, APRN, NP-C, AGACNP-BC [facility] - Electrophysiology  09/18/2022, 12:54 PM

## 2022-09-18 ENCOUNTER — Ambulatory Visit: Payer: Medicare Other | Attending: Internal Medicine | Admitting: Pulmonary Disease

## 2022-09-18 ENCOUNTER — Encounter: Payer: Self-pay | Admitting: Pulmonary Disease

## 2022-09-18 VITALS — BP 114/62 | HR 94 | Ht 69.0 in | Wt 113.0 lb

## 2022-09-18 DIAGNOSIS — Z950 Presence of cardiac pacemaker: Secondary | ICD-10-CM | POA: Diagnosis not present

## 2022-09-18 DIAGNOSIS — I442 Atrioventricular block, complete: Secondary | ICD-10-CM

## 2022-09-18 DIAGNOSIS — I4819 Other persistent atrial fibrillation: Secondary | ICD-10-CM | POA: Insufficient documentation

## 2022-09-18 LAB — CUP PACEART INCLINIC DEVICE CHECK
Battery Remaining Longevity: 50 mo
Battery Voltage: 2.96 V
Brady Statistic RA Percent Paced: 33 %
Brady Statistic RV Percent Paced: 71 %
Date Time Interrogation Session: 20240621130747
Implantable Lead Connection Status: 753985
Implantable Lead Connection Status: 753985
Implantable Lead Implant Date: 20190304
Implantable Lead Implant Date: 20190304
Implantable Lead Location: 753859
Implantable Lead Location: 753860
Implantable Pulse Generator Implant Date: 20190304
Lead Channel Impedance Value: 387.5 Ohm
Lead Channel Impedance Value: 575 Ohm
Lead Channel Pacing Threshold Amplitude: 0.625 V
Lead Channel Pacing Threshold Amplitude: 0.75 V
Lead Channel Pacing Threshold Amplitude: 0.75 V
Lead Channel Pacing Threshold Pulse Width: 0.5 ms
Lead Channel Pacing Threshold Pulse Width: 0.5 ms
Lead Channel Pacing Threshold Pulse Width: 0.5 ms
Lead Channel Sensing Intrinsic Amplitude: 4.4 mV
Lead Channel Sensing Intrinsic Amplitude: 6.6 mV
Lead Channel Setting Pacing Amplitude: 1.625
Lead Channel Setting Pacing Amplitude: 2.5 V
Lead Channel Setting Pacing Pulse Width: 0.5 ms
Lead Channel Setting Sensing Sensitivity: 4 mV
Pulse Gen Model: 2272
Pulse Gen Serial Number: 8992729

## 2022-09-18 LAB — CBC

## 2022-09-18 MED ORDER — APIXABAN 2.5 MG PO TABS: 2.5000 mg | ORAL_TABLET | Freq: Two times a day (BID) | ORAL | 3 refills | Status: DC

## 2022-09-18 NOTE — Patient Instructions (Addendum)
Medication Instructions:    START TAKING :  ELIQUIS 2.5 MG TWICE A DAY    *If you need a refill on your cardiac medications before your next appointment, please call your pharmacy*   Lab Work:  CBC  AND BMET TODAY    If you have labs (blood work) drawn today and your tests are completely normal, you will receive your results only by: MyChart Message (if you have MyChart) OR A paper copy in the mail If you have any lab test that is abnormal or we need to change your treatment, we will call you to review the results.   Testing/Procedures: NONE ORDERED  TODAY    Follow-Up: At [facility], you and your health needs are our priority.  As part of our continuing mission to provide you with exceptional heart care, we have created designated Provider Care Teams.  These Care Teams include your primary Cardiologist (physician) and Advanced Practice Providers (APPs -  Physician Assistants and Nurse Practitioners) who all work together to provide you with the care you need, when you need it.  We recommend signing up for the patient portal called "MyChart".  Sign up information is provided on this After Visit Summary.  MyChart is used to connect with patients for Virtual Visits (Telemedicine).  Patients are able to view lab/test results, encounter notes, upcoming appointments, etc.  Non-urgent messages can be sent to your provider as well.   To learn more about what you can do with MyChart, go to [email or web address].    Your next appointment:  [place]   1 year(s)  Provider:    Lewayne Bunting, MD ONLY     Other Instructions

## 2022-09-18 NOTE — Progress Notes (Signed)
Remote pacemaker transmission.   

## 2022-09-19 LAB — BASIC METABOLIC PANEL
BUN/Creatinine Ratio: 22 (ref 10–24)
BUN: 32 mg/dL (ref 10–36)
CO2: 28 mmol/L (ref 20–29)
Calcium: 8.5 mg/dL — ABNORMAL LOW (ref 8.6–10.2)
Chloride: 99 mmol/L (ref 96–106)
Creatinine, Ser: 1.43 mg/dL — ABNORMAL HIGH (ref 0.76–1.27)
Glucose: 110 mg/dL — ABNORMAL HIGH (ref 70–99)
Potassium: 4 mmol/L (ref 3.5–5.2)
Sodium: 139 mmol/L (ref 134–144)
eGFR: 45 mL/min/{1.73_m2} — ABNORMAL LOW (ref >59)

## 2022-09-19 LAB — CBC
Hematocrit: 33.2 % — ABNORMAL LOW (ref 37.5–51.0)
Hemoglobin: 10.9 g/dL — ABNORMAL LOW (ref 13.0–17.7)
MCH: 29.5 pg (ref 26.6–33.0)
MCHC: 32.8 g/dL (ref 31.5–35.7)
MCV: 90 fL (ref 79–97)
Platelets: 124 10*3/uL — ABNORMAL LOW (ref 150–450)
RBC: 3.7 x10E6/uL — ABNORMAL LOW (ref 4.14–5.80)
RDW: 13.9 % (ref 11.6–15.4)
WBC: 4.4 10*3/uL (ref 3.4–10.8)

## 2022-10-19 ENCOUNTER — Encounter (INDEPENDENT_AMBULATORY_CARE_PROVIDER_SITE_OTHER): Payer: Self-pay | Admitting: Vascular Surgery

## 2022-10-19 ENCOUNTER — Ambulatory Visit (INDEPENDENT_AMBULATORY_CARE_PROVIDER_SITE_OTHER): Payer: Medicare Other

## 2022-10-19 ENCOUNTER — Ambulatory Visit (INDEPENDENT_AMBULATORY_CARE_PROVIDER_SITE_OTHER): Payer: Medicare Other | Admitting: Vascular Surgery

## 2022-10-19 VITALS — BP 145/84 | HR 60 | Resp 15

## 2022-10-19 DIAGNOSIS — I7133 Infrarenal abdominal aortic aneurysm, ruptured: Secondary | ICD-10-CM | POA: Diagnosis not present

## 2022-10-19 DIAGNOSIS — I2510 Atherosclerotic heart disease of native coronary artery without angina pectoris: Secondary | ICD-10-CM | POA: Diagnosis not present

## 2022-10-19 DIAGNOSIS — I480 Paroxysmal atrial fibrillation: Secondary | ICD-10-CM

## 2022-10-19 DIAGNOSIS — I10 Essential (primary) hypertension: Secondary | ICD-10-CM

## 2022-10-19 DIAGNOSIS — E785 Hyperlipidemia, unspecified: Secondary | ICD-10-CM

## 2022-10-19 NOTE — Progress Notes (Signed)
MRN : [id number]  Jay [name] is a 89 y.o. (February 06, 1926) male who presents with chief complaint of check circulation.  History of Present Illness:   The patient returns to the office for surveillance of an abdominal aortic aneurysm status post stent graft placement on 06/23/2016.    Procedure: Placement of a 28 x 12 x 12 Gore Excluder Endoprosthesis main body with a left 16 x 14 contralateral limb Placement of a 20 x 10 right iliac extender limb with a second 20 x 10 deployed in the right common iliac Coil embolization of the right internal iliac artery with 3 14 x 14 coils Placement of a 14 x 14 right external iliac extender   Patient denies abdominal pain or unusual or increased back pain, no other abdominal complaints.  Patient has a longstanding history of spinal stenosis and back pain and is already followed by pain management and has had multiple epidural injections.  No groin related complaints. No symptoms consistent with distal embolization No changes in claudication distance.    There have been no interval changes in his overall healthcare since his last visit.    Patient denies amaurosis fugax or TIA symptoms. There is no history of claudication or rest pain symptoms of the lower extremities. The patient denies angina or shortness of breath.    Duplex US of the aorta and iliac arteries shows a 7.21 cm AAA sac with no endoleak, this is an increase in the sac compared to the previous study.   No outpatient medications have been marked as taking for the 10/19/22 encounter (Appointment) with Gilda Crease, Latina Craver, MD.    Past Medical History:  Diagnosis Date   Allergic rhinitis    ,RAD wheezing   Cataracts, bilateral    Complication of anesthesia    hallucinations after aaa surgery and valve replacement   Diverticulosis    , ACBE, flex, in 2003   Dyslipidemia    Fracture of left clavicle    , Hx    Hearing loss    , Bilateral   History of kidney stones    h/o   Hypotension 11/2018   Hypothyroidism    Insomnia    OA (optic atrophy)    Osteoarthritis    Pelvic fracture (HCC)    , Hx   RBBB (right bundle branch block)    , seen on EKG   Varicose veins     Past Surgical History:  Procedure Laterality Date   ABDOMINAL AORTOGRAM N/A 06/22/2016   Procedure: Abdominal Aortogram;  Surgeon: Renford Dills, MD;  Location: [facility];  Service: Cardiovascular;  Laterality: N/A;   AORTIC INTERVENTION N/A 06/22/2016   Procedure: Aortic Intervention;  Surgeon: Renford Dills, MD;  Location: [facility];  Service: Cardiovascular;  Laterality: N/A;   AORTIC VALVE REPLACEMENT  2011   COW VALVE   CATARACT EXTRACTION W/PHACO Right 02/17/2016   Procedure: CATARACT EXTRACTION PHACO AND INTRAOCULAR LENS PLACEMENT (IOC);  Surgeon: Sherald Hess, MD;  Location: [facility];  Service: Ophthalmology;  Laterality: Right;  RIGHT   INGUINAL  HERNIA REPAIR Bilateral    KYPHOPLASTY N/A 06/29/2019   Procedure: T12 KYPHOPLASTY;  Surgeon: Kennedy Bucker, MD;  Location: [facility];  Service: Orthopedics;  Laterality: N/A;   KYPHOPLASTY N/A 07/11/2019   Procedure: T11 KYPHOPLASTY;  Surgeon: Kennedy Bucker, MD;  Location: [facility];  Service: Orthopedics;  Laterality: N/A;   KYPHOPLASTY N/A 07/25/2019   Procedure: L1 KYPHOPLASTY;  Surgeon: Kennedy Bucker, MD;  Location: [facility];  Service: Orthopedics;  Laterality: N/A;   Orchectomy Right    PACEMAKER IMPLANT N/A 05/31/2017   Procedure: PACEMAKER IMPLANT;  Surgeon: Marinus Maw, MD;  Location: [facility];  Service: Cardiovascular;  Laterality: N/A;   TEMPORARY PACEMAKER N/A 05/31/2017   Procedure: TEMPORARY PACEMAKER;  Surgeon: Tonny Bollman, MD;  Location: [facility];  Service: Cardiovascular;  Laterality: N/A;   THYROIDECTOMY      Social History Social History   Tobacco Use   Smoking status: Former     Current packs/day: 0.00    Average packs/day: 0.5 packs/day for 30.0 years (15.0 ttl pk-yrs)    Types: Cigarettes    Start date: 03/31/1935    Quit date: 03/30/1965    Years since quitting: 57.5   Smokeless tobacco: Current    Types: Chew  Vaping Use   Vaping status: Never Used  Substance Use Topics   Alcohol use: Yes    Alcohol/week: 1.0 standard drink of alcohol    Types: 1 Shots of liquor per week   Drug use: No    Family History Family History  Problem Relation Age of Onset   Diabetes Mellitus II Mother     Allergies  Allergen Reactions   Aleve [Naproxen Sodium] Hives   Codeine Other (See Comments)    unknown   Penicillin G Benzathine     Pt doesn't remember reaction     REVIEW OF SYSTEMS (Negative unless checked)  Constitutional: [] Weight loss  [] Fever  [] Chills Cardiac: [] Chest pain   [] Chest pressure   [] Palpitations   [] Shortness of breath when laying flat   [] Shortness of breath with exertion. Vascular:  [x] Pain in legs with walking   [] Pain in legs at rest  [] History of DVT   [] Phlebitis   [] Swelling in legs   [] Varicose veins   [] Non-healing ulcers Pulmonary:   [] Uses home oxygen   [] Productive cough   [] Hemoptysis   [] Wheeze  [] COPD   [] Asthma Neurologic:  [] Dizziness   [] Seizures   [] History of stroke   [] History of TIA  [] Aphasia   [] Vissual changes   [] Weakness or numbness in arm   [] Weakness or numbness in leg Musculoskeletal:   [] Joint swelling   [] Joint pain   [] Low back pain Hematologic:  [] Easy bruising  [] Easy bleeding   [] Hypercoagulable state   [] Anemic Gastrointestinal:  [] Diarrhea   [] Vomiting  [] Gastroesophageal reflux/heartburn   [] Difficulty swallowing. Genitourinary:  [] Chronic kidney disease   [] Difficult urination  [] Frequent urination   [] Blood in urine Skin:  [] Rashes   [] Ulcers  Psychological:  [] History of anxiety   []  History of major depression.  Physical Examination  There were no vitals filed for this visit. There is no height or  weight on file to calculate BMI. Gen: WD/WN, NAD Head: [place]/AT, No temporalis wasting.  Ear/Nose/Throat: Hearing grossly intact, nares w/o erythema or drainage Eyes: PER, EOMI, sclera nonicteric.  Neck: Supple, no masses.  No bruit or JVD.  Pulmonary:  Good air movement, no audible wheezing, no use of accessory muscles.  Cardiac: RRR, normal  S1, S2, no Murmurs. Vascular:  mild trophic changes, no open wounds Vessel Right Left  Radial Palpable Palpable  PT Not Palpable Not Palpable  DP Not Palpable Not Palpable  Gastrointestinal: soft, non-distended. No guarding/no peritoneal signs.  Musculoskeletal: M/S 5/5 throughout.  No visible deformity.  Neurologic: CN 2-12 intact. Pain and light touch intact in extremities.  Symmetrical.  Speech is fluent. Motor exam as listed above. Psychiatric: Judgment intact, Mood & affect appropriate for pt's clinical situation. Dermatologic: No rashes or ulcers noted.  No changes consistent with cellulitis.   CBC Lab Results  Component Value Date   WBC 4.4 09/18/2022   HGB 10.9 (L) 09/18/2022   HCT 33.2 (L) 09/18/2022   MCV 90 09/18/2022   PLT 124 (L) 09/18/2022    BMET    Component Value Date/Time   NA 139 09/18/2022 1237   K 4.0 09/18/2022 1237   CL 99 09/18/2022 1237   CO2 28 09/18/2022 1237   GLUCOSE 110 (H) 09/18/2022 1237   GLUCOSE 107 (H) 05/31/2020 1429   BUN 32 09/18/2022 1237   CREATININE 1.43 (H) 09/18/2022 1237   CALCIUM 8.5 (L) 09/18/2022 1237   GFRNONAA 49 (L) 05/31/2020 1429   GFRAA 60 (L) 06/23/2019 1601   CrCl cannot be calculated (Patient's most recent lab result is older than the maximum 21 days allowed.).  COAG Lab Results  Component Value Date   INR 1.14 05/30/2017   INR 1.16 06/22/2016   INR 1.12 03/30/2016    Radiology No results found.   Assessment/Plan 1. Ruptured infrarenal abdominal aortic aneurysm (AAA) (HCC) Recommend:  The patient has an abdominal aortic aneurysm that is 5.0 cm or greater by duplex  scan and based on this study it appears that it is suitable for endovascular treatment.  The patient is otherwise in reasonable health.   Therefore, the patient should undergo endovascular repair of the AAA to prevent future leathal rupture.   Patient will require CT angiography of the abdomen and pelvis in order to appropriately plan repair of the AAA.  The risks and benefits as well as the alternative therapies was discussed in detail with the patient. All questions were answered. The patient agrees to move forward the AAA repair.  Therefore a CT angiogram with be scheduled as an outpatient.  The patient will follow up with me in the office after the CT scan to review the study and finalize plan for repair. - CT Angio Abd/Pel w/ and/or w/o; Future  2. Essential hypertension Continue antihypertensive medications as already ordered, these medications have been reviewed and there are no changes at this time.  3. Coronary artery disease involving native coronary artery of native heart without angina pectoris Continue cardiac and antihypertensive medications as already ordered and reviewed, no changes at this time.  Continue statin as ordered and reviewed, no changes at this time  Nitrates PRN for chest pain  4. Paroxysmal atrial fibrillation (HCC) Continue antiarrhythmia medications as already ordered, these medications have been reviewed and there are no changes at this time.  Continue anticoagulation as ordered by Cardiology Service  5. Dyslipidemia Continue statin as ordered and reviewed, no changes at this time    Levora Dredge, MD  10/19/2022 8:32 AM

## 2022-10-26 ENCOUNTER — Ambulatory Visit
Admission: RE | Admit: 2022-10-26 | Discharge: 2022-10-26 | Disposition: A | Discharge status: Home / Self Care | Payer: Medicare Other | Source: Ambulatory Visit | Attending: Vascular Surgery | Admitting: Vascular Surgery

## 2022-10-26 DIAGNOSIS — I7133 Infrarenal abdominal aortic aneurysm, ruptured: Secondary | ICD-10-CM

## 2022-10-26 LAB — POCT I-STAT CREATININE: Creatinine, Ser: 1.6 mg/dL — ABNORMAL HIGH (ref 0.61–1.24)

## 2022-10-26 MED ORDER — IOHEXOL 350 MG/ML SOLN
75.0000 mL | Freq: Once | INTRAVENOUS | Status: AC | PRN
  Administered 2022-10-26: 75 mL via INTRAVENOUS

## 2022-10-29 ENCOUNTER — Encounter (INDEPENDENT_AMBULATORY_CARE_PROVIDER_SITE_OTHER): Payer: Self-pay | Admitting: Vascular Surgery

## 2022-10-29 ENCOUNTER — Ambulatory Visit (INDEPENDENT_AMBULATORY_CARE_PROVIDER_SITE_OTHER): Payer: Medicare Other | Admitting: Vascular Surgery

## 2022-10-29 VITALS — BP 117/72 | HR 60 | Resp 16

## 2022-10-29 DIAGNOSIS — I2510 Atherosclerotic heart disease of native coronary artery without angina pectoris: Secondary | ICD-10-CM

## 2022-10-29 DIAGNOSIS — I10 Essential (primary) hypertension: Secondary | ICD-10-CM

## 2022-10-29 DIAGNOSIS — I9789 Other postprocedural complications and disorders of the circulatory system, not elsewhere classified: Secondary | ICD-10-CM

## 2022-10-29 DIAGNOSIS — I4819 Other persistent atrial fibrillation: Secondary | ICD-10-CM | POA: Diagnosis not present

## 2022-10-29 DIAGNOSIS — I7133 Infrarenal abdominal aortic aneurysm, ruptured: Secondary | ICD-10-CM

## 2022-11-01 ENCOUNTER — Encounter (INDEPENDENT_AMBULATORY_CARE_PROVIDER_SITE_OTHER): Payer: Self-pay | Admitting: Vascular Surgery

## 2022-11-01 DIAGNOSIS — I9789 Other postprocedural complications and disorders of the circulatory system, not elsewhere classified: Secondary | ICD-10-CM | POA: Insufficient documentation

## 2022-11-01 NOTE — Progress Notes (Signed)
MRN : [id number]  Jay [name] is a 89 y.o. (10-02-1925) male who presents with chief complaint of check circulation.  History of Present Illness:   The patient is seen for follow up evaluation of AAA status post CTA. There were no problems or complications related to the CT scan. The patient denies interval development of abdominal or back pain. No new lower extremity pain or discoloration of the toes.   The patient denies recent episodes of angina or shortness of breath. The patient denies interval anaurosis fugax. There is no recent history of TIA symptoms or focal motor deficits. The patient denies PAD or claudication symptoms.   CT angiography of the abdomen and pelvis shows an infrarenal AAA status post endograft placement with a type II endoleak noted.  Current Meds  Medication Sig   acetaminophen (TYLENOL) 650 MG CR tablet Take 1,300 mg by mouth every 8 (eight) hours as needed for pain.   albuterol (PROVENTIL HFA;VENTOLIN HFA) 108 (90 BASE) MCG/ACT inhaler Inhale 2 puffs into the lungs every 6 (six) hours as needed for wheezing or shortness of breath.   apixaban (ELIQUIS) 2.5 MG TABS tablet Take 1 tablet (2.5 mg total) by mouth 2 (two) times daily.   atorvastatin (LIPITOR) 10 MG tablet Take 10 mg by mouth daily at 6 PM.    [employer] PURELAX 17 GM/SCOOP powder Take by mouth.   furosemide (LASIX) 20 MG tablet Take 1 tablet (20 mg total) by mouth daily. Please call and schedule an appointment for further refills 1st attempt   HYDROcodone-acetaminophen (NORCO) 7.5-325 MG tablet Take 0.5-1 tablets by mouth 2 (two) times daily as needed.   levothyroxine (SYNTHROID, LEVOTHROID) 88 MCG tablet Take 88 mcg by mouth daily before breakfast.   mirtazapine (REMERON) 15 MG tablet Take 1 tablet by mouth daily.   saw palmetto 500 MG capsule Take 500 mg by mouth daily.    Past Medical History:  Diagnosis Date   Allergic rhinitis     ,RAD wheezing   Cataracts, bilateral    Complication of anesthesia    hallucinations after aaa surgery and valve replacement   Diverticulosis    , ACBE, flex, in 2003   Dyslipidemia    Fracture of left clavicle    , Hx   Hearing loss    , Bilateral   History of kidney stones    h/o   Hypotension 11/2018   Hypothyroidism    Insomnia    OA (optic atrophy)    Osteoarthritis    Pelvic fracture (HCC)    , Hx   RBBB (right bundle branch block)    , seen on EKG   Varicose veins     Past Surgical History:  Procedure Laterality Date   ABDOMINAL AORTOGRAM N/A 06/22/2016   Procedure: Abdominal Aortogram;  Surgeon: Renford Dills, MD;  Location: [facility];  Service: Cardiovascular;  Laterality: N/A;   AORTIC INTERVENTION N/A 06/22/2016   Procedure: Aortic Intervention;  Surgeon: Renford Dills, MD;  Location: [facility];  Service: Cardiovascular;  Laterality: N/A;   AORTIC VALVE REPLACEMENT  2011   COW VALVE   CATARACT EXTRACTION W/PHACO Right 02/17/2016   Procedure: CATARACT EXTRACTION PHACO AND INTRAOCULAR LENS PLACEMENT (IOC);  Surgeon: Sherald Hess, MD;  Location: [facility];  Service: Ophthalmology;  Laterality: Right;  RIGHT   INGUINAL HERNIA REPAIR Bilateral    KYPHOPLASTY N/A 06/29/2019   Procedure: T12 KYPHOPLASTY;  Surgeon: Kennedy Bucker, MD;  Location: [facility];  Service: Orthopedics;  Laterality: N/A;   KYPHOPLASTY N/A 07/11/2019   Procedure: T11 KYPHOPLASTY;  Surgeon: Kennedy Bucker, MD;  Location: [facility];  Service: Orthopedics;  Laterality: N/A;   KYPHOPLASTY N/A 07/25/2019   Procedure: L1 KYPHOPLASTY;  Surgeon: Kennedy Bucker, MD;  Location: [facility];  Service: Orthopedics;  Laterality: N/A;   Orchectomy Right    PACEMAKER IMPLANT N/A 05/31/2017   Procedure: PACEMAKER IMPLANT;  Surgeon: Marinus Maw, MD;  Location: [facility];  Service: Cardiovascular;  Laterality: N/A;   TEMPORARY PACEMAKER N/A 05/31/2017    Procedure: TEMPORARY PACEMAKER;  Surgeon: Tonny Bollman, MD;  Location: [facility];  Service: Cardiovascular;  Laterality: N/A;   THYROIDECTOMY      Social History Social History   Tobacco Use   Smoking status: Former    Current packs/day: 0.00    Average packs/day: 0.5 packs/day for 30.0 years (15.0 ttl pk-yrs)    Types: Cigarettes    Start date: 03/31/1935    Quit date: 03/30/1965    Years since quitting: 57.6   Smokeless tobacco: Current    Types: Chew  Vaping Use   Vaping status: Never Used  Substance Use Topics   Alcohol use: Yes    Alcohol/week: 1.0 standard drink of alcohol    Types: 1 Shots of liquor per week   Drug use: No    Family History Family History  Problem Relation Age of Onset   Diabetes Mellitus II Mother     Allergies  Allergen Reactions   Aleve [Naproxen Sodium] Hives   Codeine Other (See Comments)    unknown   Penicillin G Benzathine     Pt doesn't remember reaction     REVIEW OF SYSTEMS (Negative unless checked)  Constitutional: [] Weight loss  [] Fever  [] Chills Cardiac: [] Chest pain   [] Chest pressure   [] Palpitations   [] Shortness of breath when laying flat   [] Shortness of breath with exertion. Vascular:  [x] Pain in legs with walking   [] Pain in legs at rest  [] History of DVT   [] Phlebitis   [] Swelling in legs   [] Varicose veins   [] Non-healing ulcers Pulmonary:   [] Uses home oxygen   [] Productive cough   [] Hemoptysis   [] Wheeze  [] COPD   [] Asthma Neurologic:  [] Dizziness   [] Seizures   [] History of stroke   [] History of TIA  [] Aphasia   [] Vissual changes   [] Weakness or numbness in arm   [] Weakness or numbness in leg Musculoskeletal:   [] Joint swelling   [] Joint pain   [] Low back pain Hematologic:  [] Easy bruising  [] Easy bleeding   [] Hypercoagulable state   [] Anemic Gastrointestinal:  [] Diarrhea   [] Vomiting  [] Gastroesophageal reflux/heartburn   [] Difficulty swallowing. Genitourinary:  [] Chronic kidney disease   [] Difficult  urination  [] Frequent urination   [] Blood in urine Skin:  [] Rashes   [] Ulcers  Psychological:  [] History of anxiety   []  History of major depression.  Physical Examination  Vitals:   10/29/22 1614  BP: 117/72  Pulse: 60  Resp: 16   There is no height or weight on file to  calculate BMI. Gen: WD/WN, NAD Head: [place]/AT, No temporalis wasting.  Ear/Nose/Throat: Hearing grossly intact, nares w/o erythema or drainage Eyes: PER, EOMI, sclera nonicteric.  Neck: Supple, no masses.  No bruit or JVD.  Pulmonary:  Good air movement, no audible wheezing, no use of accessory muscles.  Cardiac: RRR, normal S1, S2, no Murmurs. Vascular:  mild trophic changes, no open wounds Vessel Right Left  Radial Palpable Palpable  PT Not Palpable Not Palpable  DP Not Palpable Not Palpable  Gastrointestinal: soft, non-distended. No guarding/no peritoneal signs.  Musculoskeletal: M/S 5/5 throughout.  No visible deformity.  Neurologic: CN 2-12 intact. Pain and light touch intact in extremities.  Symmetrical.  Speech is fluent. Motor exam as listed above. Psychiatric: Judgment intact, Mood & affect appropriate for pt's clinical situation. Dermatologic: No rashes or ulcers noted.  No changes consistent with cellulitis.   CBC Lab Results  Component Value Date   WBC 4.4 09/18/2022   HGB 10.9 (L) 09/18/2022   HCT 33.2 (L) 09/18/2022   MCV 90 09/18/2022   PLT 124 (L) 09/18/2022    BMET    Component Value Date/Time   NA 139 09/18/2022 1237   K 4.0 09/18/2022 1237   CL 99 09/18/2022 1237   CO2 28 09/18/2022 1237   GLUCOSE 110 (H) 09/18/2022 1237   GLUCOSE 107 (H) 05/31/2020 1429   BUN 32 09/18/2022 1237   CREATININE 1.60 (H) 10/26/2022 0813   CALCIUM 8.5 (L) 09/18/2022 1237   GFRNONAA 49 (L) 05/31/2020 1429   GFRAA 60 (L) 06/23/2019 1601   CrCl cannot be calculated (Unknown ideal weight.).  COAG Lab Results  Component Value Date   INR 1.14 05/30/2017   INR 1.16 06/22/2016   INR 1.12 03/30/2016     Radiology CT Angio Abd/Pel w/ and/or w/o  Result Date: 10/26/2022 CLINICAL DATA:  Abdominal aortic aneurysm follow-up EXAM: CTA ABDOMEN AND PELVIS WITHOUT AND WITH CONTRAST TECHNIQUE: Multidetector CT imaging of the abdomen and pelvis was performed using the standard protocol during bolus administration of intravenous contrast. Multiplanar reconstructed images and MIPs were obtained and reviewed to evaluate the vascular anatomy. RADIATION DOSE REDUCTION: This exam was performed according to the departmental dose-optimization program which includes automated exposure control, adjustment of the mA and/or kV according to patient size and/or use of iterative reconstruction technique. CONTRAST:  75mL OMNIPAQUE IOHEXOL 350 MG/ML SOLN COMPARISON:  Prior CT scan of the abdomen and pelvis 06/23/2019 FINDINGS: VASCULAR Aorta: Fusiform aneurysmal dilation of the infrarenal abdominal aorta status post endovascular aortic repair. Bifurcated endoprosthesis extends from just below the renal arteries into the left common iliac artery and the right external iliac artery. Coil embolization of the right hypogastric artery. There is a delayed type 2 endoleak visible on the delayed phase imaging with patchy enhancement of the excluded aneurysm sac. The excluded aneurysm sac has significantly enlarged measuring 7.6 x 7.1 cm compared to 6.3 x 6.7 cm in March of 2021. Celiac: Patent without evidence of aneurysm, dissection, vasculitis or significant stenosis. SMA: Patent without evidence of aneurysm, dissection, vasculitis or significant stenosis. Renals: Solitary renal arteries bilaterally. Significantly angulated right renal artery with mild to moderate stenosis due to calcified atherosclerotic plaque. The left renal artery is calcified at the origin but appears to remain patent without significant stenosis. IMA: Appears occluded at the origin. Inflow: Patent without evidence of aneurysm, dissection, vasculitis or significant  stenosis. Proximal Outflow: Limited evaluation due to delayed contrast bolus. Veins: No focal venous abnormality. Review of the MIP images  confirms the above findings. NON-VASCULAR Lower chest: Centrilobular pulmonary emphysema. Dependent atelectasis. Mechanical aortic valve. Aortic and multivessel coronary artery atherosclerotic vascular calcifications. Partially imaged cardiac rhythm maintenance device leads in the right atrium and right ventricle. Hepatobiliary: Normal hepatic contour and morphology. No discrete hepatic lesion. High attenuation stones are present within the gallbladder lumen. No biliary ductal dilatation. Pancreas: Unremarkable. No pancreatic ductal dilatation or surrounding inflammatory changes. Spleen: Normal in size without focal abnormality. Adrenals/Urinary Tract: Adrenal glands are unremarkable. Kidneys are normal, without renal calculi, focal lesion, or hydronephrosis. Diffuse bladder wall thickening/trabeculation with multiple small diverticula. Stomach/Bowel: Large volume of stool in the rectum. Extensive colonic diverticulosis without evidence of active inflammation. No focal bowel wall thickening or evidence of obstruction. Large duodenal diverticulum. Lymphatic: No suspicious lymphadenopathy. Reproductive: Prostate is unremarkable. Other: No abdominal wall hernia or abnormality. No abdominopelvic ascites. Musculoskeletal: No acute fracture or aggressive appearing lytic or blastic osseous lesion. Prior cement augmentation at T11, T12 and L1. Chronic appearing compression fracture of L2. IMPRESSION: VASCULAR 1. Surgical changes of prior EVAR of infrarenal abdominal aortic aneurysm with evidence of a delayed type 2 endoleak. The excluded aneurysm sac has enlarged over-the 3 years since the prior study now measuring 7.6 x 7.1 cm compared to 6.7 x 6.3 cm previously. 2. Extensive atherosclerotic plaque throughout all vascular beds. NON-VASCULAR 1. Centrilobular pulmonary emphysema. 2.  Mechanical aortic valve. 3. Cholelithiasis. 4. Extensive colonic diverticulosis without evidence of active inflammation. 5. Interval cement augmentation of compression fractures at T11, T12 and L1. 6. Untreated L2 compression fracture without focal lucency is age indeterminate and may be acute, subacute or chronic. Aortic aneurysm NOS (ICD10-I71.9); aortic Atherosclerosis (ICD10-I70.0) and Emphysema (ICD10-J43.9). Electronically Signed   By: Malachy Moan M.D.   On: 10/26/2022 09:29   VAS [place] EVAR DUPLEX  Result Date: 10/21/2022 Endovascular Aortic Repair Study (EVAR) Patient Name:  Jay [name]  Date of Exam:   10/19/2022 Medical Rec #: [id number]        Accession #:    [id number] Date of Birth: [birth year]         Patient Gender: M Patient Age:   68 years Exam Location:  [facility] Procedure:      VAS [place] EVAR DUPLEX Referring Phys: Levora Dredge --------------------------------------------------------------------------------  Indications: Follow up exam for EVAR. Risk Factors: Hypertension, past history of smoking, coronary artery disease. Vascular Interventions: EVAR on 06/22/16;.  Performing Technologist: Hardie Lora RVT  Examination Guidelines: A complete evaluation includes B-mode imaging, spectral Doppler, color Doppler, and power Doppler as needed of all accessible portions of each vessel. Bilateral testing is considered an integral part of a complete examination. Limited examinations for reoccurring indications may be performed as noted.  Abdominal Aorta Findings: +--------+-------+----------+----------+--------+--------+--------+ LocationAP (cm)Trans (cm)PSV (cm/s)WaveformThrombusComments +--------+-------+----------+----------+--------+--------+--------+ Proximal2.70   2.79      25                                 +--------+-------+----------+----------+--------+--------+--------+ Endovascular Aortic Repair (EVAR):  +----------+----------------+-------------------+-------------------+           Diameter AP (cm)Diameter Trans (cm)Velocities (cm/sec) +----------+----------------+-------------------+-------------------+ Aorta     6.85            7.21               42                  +----------+----------------+-------------------+-------------------+ Right Limb1.74  1.98               33                  +----------+----------------+-------------------+-------------------+ Left Limb 1.39            1.58               55                  +----------+----------------+-------------------+-------------------+ +-------------+---------------------+ Endoleak TypeNo endoleak detected. +-------------+---------------------+  Summary: Abdominal Aorta: Patent endovascular aneurysm repair with no evidence of endoleak. Could not visualize the flow seen within the residual sac documented on prior study. The largest aortic diameter has increased compared to prior exam. Previous diameter measurement was 6.8 cm obtained on 10/16/2021.  *See table(s) above for measurements and observations.  Electronically signed by Levora Dredge MD on 10/21/2022 at 4:27:20 PM.    Final      Assessment/Plan 1. Ruptured infrarenal abdominal aortic aneurysm (AAA) (HCC) Recommend:  The aneurysm sac is continuing to expand and the CT shows a type II endoleak and therefore he should undergo angiography with the hope for intervention. Patient is status post CT scan of the abdominal aorta as well as repair using a stent graft of his rupture. The patient is a candidate for endovascular treatment with angiography from the left iliac approach.  The right internal iliac was coiled at the time of's of the original stent graft placement  The patient will continue antiplatelet therapy as prescribed (since the patient is undergoing intervention as opposed to open repair) as well as aggressive management of hyperlipidemia. Exercise is  again strongly encouraged.   The patient is reminded that lifetime routine surveillance is a necessity with an endograft.   The risks and benefits of AAA repair are reviewed with the patient.  All questions are answered.  Alternative therapies are also discussed.  The patient agrees to proceed with endovascular aneurysm repair.  Patient will follow-up with me in the office after the surgery.  2. Type II endoleak of aortic graft Recommend:  The aneurysm sac is continuing to expand and the CT shows a type II endoleak and therefore he should undergo angiography with the hope for intervention. Patient is status post CT scan of the abdominal aorta as well as repair using a stent graft of his rupture. The patient is a candidate for endovascular treatment with angiography from the left iliac approach.  The right internal iliac was coiled at the time of's of the original stent graft placement  The patient will continue antiplatelet therapy as prescribed (since the patient is undergoing intervention as opposed to open repair) as well as aggressive management of hyperlipidemia. Exercise is again strongly encouraged.   The patient is reminded that lifetime routine surveillance is a necessity with an endograft.   The risks and benefits of AAA repair are reviewed with the patient.  All questions are answered.  Alternative therapies are also discussed.  The patient agrees to proceed with endovascular aneurysm repair.  Patient will follow-up with me in the office after the surgery.  3. Coronary artery disease involving native coronary artery of native heart without angina pectoris Continue cardiac and antihypertensive medications as already ordered and reviewed, no changes at this time.  Continue statin as ordered and reviewed, no changes at this time  Nitrates PRN for chest pain  4. Persistent atrial fibrillation (HCC) Continue antiarrhythmia medications as already ordered, these medications have been  reviewed and  there are no changes at this time.  Continue anticoagulation as ordered by Cardiology Service  5. Essential hypertension Continue antihypertensive medications as already ordered, these medications have been reviewed and there are no changes at this time.    Levora Dredge, MD  11/01/2022 2:13 PM

## 2022-11-01 NOTE — H&P (View-Only) (Signed)
MRN : [id number]  CIRO TASHIRO is a 89 y.o. (10-02-1925) male who presents with chief complaint of check circulation.  History of Present Illness:   The patient is seen for follow up evaluation of AAA status post CTA. There were no problems or complications related to the CT scan. The patient denies interval development of abdominal or back pain. No new lower extremity pain or discoloration of the toes.   The patient denies recent episodes of angina or shortness of breath. The patient denies interval anaurosis fugax. There is no recent history of TIA symptoms or focal motor deficits. The patient denies PAD or claudication symptoms.   CT angiography of the abdomen and pelvis shows an infrarenal AAA status post endograft placement with a type II endoleak noted.  Current Meds  Medication Sig   acetaminophen (TYLENOL) 650 MG CR tablet Take 1,300 mg by mouth every 8 (eight) hours as needed for pain.   albuterol (PROVENTIL HFA;VENTOLIN HFA) 108 (90 BASE) MCG/ACT inhaler Inhale 2 puffs into the lungs every 6 (six) hours as needed for wheezing or shortness of breath.   apixaban (ELIQUIS) 2.5 MG TABS tablet Take 1 tablet (2.5 mg total) by mouth 2 (two) times daily.   atorvastatin (LIPITOR) 10 MG tablet Take 10 mg by mouth daily at 6 PM.    [employer] PURELAX 17 GM/SCOOP powder Take by mouth.   furosemide (LASIX) 20 MG tablet Take 1 tablet (20 mg total) by mouth daily. Please call and schedule an appointment for further refills 1st attempt   HYDROcodone-acetaminophen (NORCO) 7.5-325 MG tablet Take 0.5-1 tablets by mouth 2 (two) times daily as needed.   levothyroxine (SYNTHROID, LEVOTHROID) 88 MCG tablet Take 88 mcg by mouth daily before breakfast.   mirtazapine (REMERON) 15 MG tablet Take 1 tablet by mouth daily.   saw palmetto 500 MG capsule Take 500 mg by mouth daily.    Past Medical History:  Diagnosis Date   Allergic rhinitis     ,RAD wheezing   Cataracts, bilateral    Complication of anesthesia    hallucinations after aaa surgery and valve replacement   Diverticulosis    , ACBE, flex, in 2003   Dyslipidemia    Fracture of left clavicle    , Hx   Hearing loss    , Bilateral   History of kidney stones    h/o   Hypotension 11/2018   Hypothyroidism    Insomnia    OA (optic atrophy)    Osteoarthritis    Pelvic fracture (HCC)    , Hx   RBBB (right bundle branch block)    , seen on EKG   Varicose veins     Past Surgical History:  Procedure Laterality Date   ABDOMINAL AORTOGRAM N/A 06/22/2016   Procedure: Abdominal Aortogram;  Surgeon: Renford Dills, MD;  Location: [facility];  Service: Cardiovascular;  Laterality: N/A;   AORTIC INTERVENTION N/A 06/22/2016   Procedure: Aortic Intervention;  Surgeon: Renford Dills, MD;  Location: [facility];  Service: Cardiovascular;  Laterality: N/A;   AORTIC VALVE REPLACEMENT  2011   COW VALVE   CATARACT EXTRACTION W/PHACO Right 02/17/2016   Procedure: CATARACT EXTRACTION PHACO AND INTRAOCULAR LENS PLACEMENT (IOC);  Surgeon: Sherald Hess, MD;  Location: [facility];  Service: Ophthalmology;  Laterality: Right;  RIGHT   INGUINAL HERNIA REPAIR Bilateral    KYPHOPLASTY N/A 06/29/2019   Procedure: T12 KYPHOPLASTY;  Surgeon: Kennedy Bucker, MD;  Location: [facility];  Service: Orthopedics;  Laterality: N/A;   KYPHOPLASTY N/A 07/11/2019   Procedure: T11 KYPHOPLASTY;  Surgeon: Kennedy Bucker, MD;  Location: [facility];  Service: Orthopedics;  Laterality: N/A;   KYPHOPLASTY N/A 07/25/2019   Procedure: L1 KYPHOPLASTY;  Surgeon: Kennedy Bucker, MD;  Location: [facility];  Service: Orthopedics;  Laterality: N/A;   Orchectomy Right    PACEMAKER IMPLANT N/A 05/31/2017   Procedure: PACEMAKER IMPLANT;  Surgeon: Marinus Maw, MD;  Location: [facility];  Service: Cardiovascular;  Laterality: N/A;   TEMPORARY PACEMAKER N/A 05/31/2017    Procedure: TEMPORARY PACEMAKER;  Surgeon: Tonny Bollman, MD;  Location: [facility];  Service: Cardiovascular;  Laterality: N/A;   THYROIDECTOMY      Social History Social History   Tobacco Use   Smoking status: Former    Current packs/day: 0.00    Average packs/day: 0.5 packs/day for 30.0 years (15.0 ttl pk-yrs)    Types: Cigarettes    Start date: 03/31/1935    Quit date: 03/30/1965    Years since quitting: 57.6   Smokeless tobacco: Current    Types: Chew  Vaping Use   Vaping status: Never Used  Substance Use Topics   Alcohol use: Yes    Alcohol/week: 1.0 standard drink of alcohol    Types: 1 Shots of liquor per week   Drug use: No    Family History Family History  Problem Relation Age of Onset   Diabetes Mellitus II Mother     Allergies  Allergen Reactions   Aleve [Naproxen Sodium] Hives   Codeine Other (See Comments)    unknown   Penicillin G Benzathine     Pt doesn't remember reaction     REVIEW OF SYSTEMS (Negative unless checked)  Constitutional: [] Weight loss  [] Fever  [] Chills Cardiac: [] Chest pain   [] Chest pressure   [] Palpitations   [] Shortness of breath when laying flat   [] Shortness of breath with exertion. Vascular:  [x] Pain in legs with walking   [] Pain in legs at rest  [] History of DVT   [] Phlebitis   [] Swelling in legs   [] Varicose veins   [] Non-healing ulcers Pulmonary:   [] Uses home oxygen   [] Productive cough   [] Hemoptysis   [] Wheeze  [] COPD   [] Asthma Neurologic:  [] Dizziness   [] Seizures   [] History of stroke   [] History of TIA  [] Aphasia   [] Vissual changes   [] Weakness or numbness in arm   [] Weakness or numbness in leg Musculoskeletal:   [] Joint swelling   [] Joint pain   [] Low back pain Hematologic:  [] Easy bruising  [] Easy bleeding   [] Hypercoagulable state   [] Anemic Gastrointestinal:  [] Diarrhea   [] Vomiting  [] Gastroesophageal reflux/heartburn   [] Difficulty swallowing. Genitourinary:  [] Chronic kidney disease   [] Difficult  urination  [] Frequent urination   [] Blood in urine Skin:  [] Rashes   [] Ulcers  Psychological:  [] History of anxiety   []  History of major depression.  Physical Examination  Vitals:   10/29/22 1614  BP: 117/72  Pulse: 60  Resp: 16   There is no height or weight on file to  calculate BMI. Gen: WD/WN, NAD Head: [place]/AT, No temporalis wasting.  Ear/Nose/Throat: Hearing grossly intact, nares w/o erythema or drainage Eyes: PER, EOMI, sclera nonicteric.  Neck: Supple, no masses.  No bruit or JVD.  Pulmonary:  Good air movement, no audible wheezing, no use of accessory muscles.  Cardiac: RRR, normal S1, S2, no Murmurs. Vascular:  mild trophic changes, no open wounds Vessel Right Left  Radial Palpable Palpable  PT Not Palpable Not Palpable  DP Not Palpable Not Palpable  Gastrointestinal: soft, non-distended. No guarding/no peritoneal signs.  Musculoskeletal: M/S 5/5 throughout.  No visible deformity.  Neurologic: CN 2-12 intact. Pain and light touch intact in extremities.  Symmetrical.  Speech is fluent. Motor exam as listed above. Psychiatric: Judgment intact, Mood & affect appropriate for pt's clinical situation. Dermatologic: No rashes or ulcers noted.  No changes consistent with cellulitis.   CBC Lab Results  Component Value Date   WBC 4.4 09/18/2022   HGB 10.9 (L) 09/18/2022   HCT 33.2 (L) 09/18/2022   MCV 90 09/18/2022   PLT 124 (L) 09/18/2022    BMET    Component Value Date/Time   NA 139 09/18/2022 1237   K 4.0 09/18/2022 1237   CL 99 09/18/2022 1237   CO2 28 09/18/2022 1237   GLUCOSE 110 (H) 09/18/2022 1237   GLUCOSE 107 (H) 05/31/2020 1429   BUN 32 09/18/2022 1237   CREATININE 1.60 (H) 10/26/2022 0813   CALCIUM 8.5 (L) 09/18/2022 1237   GFRNONAA 49 (L) 05/31/2020 1429   GFRAA 60 (L) 06/23/2019 1601   CrCl cannot be calculated (Unknown ideal weight.).  COAG Lab Results  Component Value Date   INR 1.14 05/30/2017   INR 1.16 06/22/2016   INR 1.12 03/30/2016     Radiology CT Angio Abd/Pel w/ and/or w/o  Result Date: 10/26/2022 CLINICAL DATA:  Abdominal aortic aneurysm follow-up EXAM: CTA ABDOMEN AND PELVIS WITHOUT AND WITH CONTRAST TECHNIQUE: Multidetector CT imaging of the abdomen and pelvis was performed using the standard protocol during bolus administration of intravenous contrast. Multiplanar reconstructed images and MIPs were obtained and reviewed to evaluate the vascular anatomy. RADIATION DOSE REDUCTION: This exam was performed according to the departmental dose-optimization program which includes automated exposure control, adjustment of the mA and/or kV according to patient size and/or use of iterative reconstruction technique. CONTRAST:  75mL OMNIPAQUE IOHEXOL 350 MG/ML SOLN COMPARISON:  Prior CT scan of the abdomen and pelvis 06/23/2019 FINDINGS: VASCULAR Aorta: Fusiform aneurysmal dilation of the infrarenal abdominal aorta status post endovascular aortic repair. Bifurcated endoprosthesis extends from just below the renal arteries into the left common iliac artery and the right external iliac artery. Coil embolization of the right hypogastric artery. There is a delayed type 2 endoleak visible on the delayed phase imaging with patchy enhancement of the excluded aneurysm sac. The excluded aneurysm sac has significantly enlarged measuring 7.6 x 7.1 cm compared to 6.3 x 6.7 cm in March of 2021. Celiac: Patent without evidence of aneurysm, dissection, vasculitis or significant stenosis. SMA: Patent without evidence of aneurysm, dissection, vasculitis or significant stenosis. Renals: Solitary renal arteries bilaterally. Significantly angulated right renal artery with mild to moderate stenosis due to calcified atherosclerotic plaque. The left renal artery is calcified at the origin but appears to remain patent without significant stenosis. IMA: Appears occluded at the origin. Inflow: Patent without evidence of aneurysm, dissection, vasculitis or significant  stenosis. Proximal Outflow: Limited evaluation due to delayed contrast bolus. Veins: No focal venous abnormality. Review of the MIP images  confirms the above findings. NON-VASCULAR Lower chest: Centrilobular pulmonary emphysema. Dependent atelectasis. Mechanical aortic valve. Aortic and multivessel coronary artery atherosclerotic vascular calcifications. Partially imaged cardiac rhythm maintenance device leads in the right atrium and right ventricle. Hepatobiliary: Normal hepatic contour and morphology. No discrete hepatic lesion. High attenuation stones are present within the gallbladder lumen. No biliary ductal dilatation. Pancreas: Unremarkable. No pancreatic ductal dilatation or surrounding inflammatory changes. Spleen: Normal in size without focal abnormality. Adrenals/Urinary Tract: Adrenal glands are unremarkable. Kidneys are normal, without renal calculi, focal lesion, or hydronephrosis. Diffuse bladder wall thickening/trabeculation with multiple small diverticula. Stomach/Bowel: Large volume of stool in the rectum. Extensive colonic diverticulosis without evidence of active inflammation. No focal bowel wall thickening or evidence of obstruction. Large duodenal diverticulum. Lymphatic: No suspicious lymphadenopathy. Reproductive: Prostate is unremarkable. Other: No abdominal wall hernia or abnormality. No abdominopelvic ascites. Musculoskeletal: No acute fracture or aggressive appearing lytic or blastic osseous lesion. Prior cement augmentation at T11, T12 and L1. Chronic appearing compression fracture of L2. IMPRESSION: VASCULAR 1. Surgical changes of prior EVAR of infrarenal abdominal aortic aneurysm with evidence of a delayed type 2 endoleak. The excluded aneurysm sac has enlarged over-the 3 years since the prior study now measuring 7.6 x 7.1 cm compared to 6.7 x 6.3 cm previously. 2. Extensive atherosclerotic plaque throughout all vascular beds. NON-VASCULAR 1. Centrilobular pulmonary emphysema. 2.  Mechanical aortic valve. 3. Cholelithiasis. 4. Extensive colonic diverticulosis without evidence of active inflammation. 5. Interval cement augmentation of compression fractures at T11, T12 and L1. 6. Untreated L2 compression fracture without focal lucency is age indeterminate and may be acute, subacute or chronic. Aortic aneurysm NOS (ICD10-I71.9); aortic Atherosclerosis (ICD10-I70.0) and Emphysema (ICD10-J43.9). Electronically Signed   By: Malachy Moan M.D.   On: 10/26/2022 09:29   VAS [place] EVAR DUPLEX  Result Date: 10/21/2022 Endovascular Aortic Repair Study (EVAR) Patient Name:  MOROCCO GIPE  Date of Exam:   10/19/2022 Medical Rec #: [id number]        Accession #:    [id number] Date of Birth: [birth year]         Patient Gender: M Patient Age:   68 years Exam Location:  [facility] Procedure:      VAS [place] EVAR DUPLEX Referring Phys: Levora Dredge --------------------------------------------------------------------------------  Indications: Follow up exam for EVAR. Risk Factors: Hypertension, past history of smoking, coronary artery disease. Vascular Interventions: EVAR on 06/22/16;.  Performing Technologist: Hardie Lora RVT  Examination Guidelines: A complete evaluation includes B-mode imaging, spectral Doppler, color Doppler, and power Doppler as needed of all accessible portions of each vessel. Bilateral testing is considered an integral part of a complete examination. Limited examinations for reoccurring indications may be performed as noted.  Abdominal Aorta Findings: +--------+-------+----------+----------+--------+--------+--------+ LocationAP (cm)Trans (cm)PSV (cm/s)WaveformThrombusComments +--------+-------+----------+----------+--------+--------+--------+ Proximal2.70   2.79      25                                 +--------+-------+----------+----------+--------+--------+--------+ Endovascular Aortic Repair (EVAR):  +----------+----------------+-------------------+-------------------+           Diameter AP (cm)Diameter Trans (cm)Velocities (cm/sec) +----------+----------------+-------------------+-------------------+ Aorta     6.85            7.21               42                  +----------+----------------+-------------------+-------------------+ Right Limb1.74  1.98               33                  +----------+----------------+-------------------+-------------------+ Left Limb 1.39            1.58               55                  +----------+----------------+-------------------+-------------------+ +-------------+---------------------+ Endoleak TypeNo endoleak detected. +-------------+---------------------+  Summary: Abdominal Aorta: Patent endovascular aneurysm repair with no evidence of endoleak. Could not visualize the flow seen within the residual sac documented on prior study. The largest aortic diameter has increased compared to prior exam. Previous diameter measurement was 6.8 cm obtained on 10/16/2021.  *See table(s) above for measurements and observations.  Electronically signed by Levora Dredge MD on 10/21/2022 at 4:27:20 PM.    Final      Assessment/Plan 1. Ruptured infrarenal abdominal aortic aneurysm (AAA) (HCC) Recommend:  The aneurysm sac is continuing to expand and the CT shows a type II endoleak and therefore he should undergo angiography with the hope for intervention. Patient is status post CT scan of the abdominal aorta as well as repair using a stent graft of his rupture. The patient is a candidate for endovascular treatment with angiography from the left iliac approach.  The right internal iliac was coiled at the time of's of the original stent graft placement  The patient will continue antiplatelet therapy as prescribed (since the patient is undergoing intervention as opposed to open repair) as well as aggressive management of hyperlipidemia. Exercise is  again strongly encouraged.   The patient is reminded that lifetime routine surveillance is a necessity with an endograft.   The risks and benefits of AAA repair are reviewed with the patient.  All questions are answered.  Alternative therapies are also discussed.  The patient agrees to proceed with endovascular aneurysm repair.  Patient will follow-up with me in the office after the surgery.  2. Type II endoleak of aortic graft Recommend:  The aneurysm sac is continuing to expand and the CT shows a type II endoleak and therefore he should undergo angiography with the hope for intervention. Patient is status post CT scan of the abdominal aorta as well as repair using a stent graft of his rupture. The patient is a candidate for endovascular treatment with angiography from the left iliac approach.  The right internal iliac was coiled at the time of's of the original stent graft placement  The patient will continue antiplatelet therapy as prescribed (since the patient is undergoing intervention as opposed to open repair) as well as aggressive management of hyperlipidemia. Exercise is again strongly encouraged.   The patient is reminded that lifetime routine surveillance is a necessity with an endograft.   The risks and benefits of AAA repair are reviewed with the patient.  All questions are answered.  Alternative therapies are also discussed.  The patient agrees to proceed with endovascular aneurysm repair.  Patient will follow-up with me in the office after the surgery.  3. Coronary artery disease involving native coronary artery of native heart without angina pectoris Continue cardiac and antihypertensive medications as already ordered and reviewed, no changes at this time.  Continue statin as ordered and reviewed, no changes at this time  Nitrates PRN for chest pain  4. Persistent atrial fibrillation (HCC) Continue antiarrhythmia medications as already ordered, these medications have been  reviewed and  there are no changes at this time.  Continue anticoagulation as ordered by Cardiology Service  5. Essential hypertension Continue antihypertensive medications as already ordered, these medications have been reviewed and there are no changes at this time.    Levora Dredge, MD  11/01/2022 2:13 PM

## 2022-11-02 ENCOUNTER — Telehealth (INDEPENDENT_AMBULATORY_CARE_PROVIDER_SITE_OTHER): Payer: Self-pay

## 2022-11-02 NOTE — Telephone Encounter (Signed)
Patient's daughter called back and the patient is scheduled for 11/03/22 with a 11:00 am arrival time to the [facility] with Dr. Gilda Crease. Pre-procedure instructions were discussed and patient's daughter understood.

## 2022-11-02 NOTE — Telephone Encounter (Signed)
A message was left for the patient or daughter to return my call. The FYI states to not leave a message or speak with anyone other than the patient.

## 2022-11-03 ENCOUNTER — Ambulatory Visit
Admission: RE | Admit: 2022-11-03 | Discharge: 2022-11-03 | Disposition: A | Discharge status: Home / Self Care | Payer: Medicare Other | Attending: Vascular Surgery | Admitting: Vascular Surgery

## 2022-11-03 ENCOUNTER — Ambulatory Visit: Admit: 2022-11-03 | Payer: Medicare Other | Admitting: Vascular Surgery

## 2022-11-03 ENCOUNTER — Encounter: Payer: Self-pay | Admitting: Vascular Surgery

## 2022-11-03 ENCOUNTER — Encounter
Admission: RE | Disposition: A | Discharge status: Home / Self Care | Payer: Self-pay | Source: Home / Self Care | Attending: Vascular Surgery

## 2022-11-03 ENCOUNTER — Other Ambulatory Visit: Payer: Self-pay

## 2022-11-03 DIAGNOSIS — Z9889 Other specified postprocedural states: Secondary | ICD-10-CM | POA: Diagnosis not present

## 2022-11-03 DIAGNOSIS — I10 Essential (primary) hypertension: Secondary | ICD-10-CM | POA: Diagnosis not present

## 2022-11-03 DIAGNOSIS — I2510 Atherosclerotic heart disease of native coronary artery without angina pectoris: Secondary | ICD-10-CM | POA: Insufficient documentation

## 2022-11-03 DIAGNOSIS — I9789 Other postprocedural complications and disorders of the circulatory system, not elsewhere classified: Secondary | ICD-10-CM | POA: Diagnosis present

## 2022-11-03 DIAGNOSIS — I4819 Other persistent atrial fibrillation: Secondary | ICD-10-CM | POA: Diagnosis not present

## 2022-11-03 DIAGNOSIS — T82330A Leakage of aortic (bifurcation) graft (replacement), initial encounter: Secondary | ICD-10-CM | POA: Diagnosis not present

## 2022-11-03 DIAGNOSIS — I714 Abdominal aortic aneurysm, without rupture, unspecified: Secondary | ICD-10-CM | POA: Diagnosis not present

## 2022-11-03 DIAGNOSIS — Z87891 Personal history of nicotine dependence: Secondary | ICD-10-CM | POA: Diagnosis not present

## 2022-11-03 DIAGNOSIS — E785 Hyperlipidemia, unspecified: Secondary | ICD-10-CM | POA: Diagnosis not present

## 2022-11-03 DIAGNOSIS — N1831 Chronic kidney disease, stage 3a: Secondary | ICD-10-CM

## 2022-11-03 HISTORY — PX: LOWER EXTREMITY ANGIOGRAPHY: CATH118251

## 2022-11-03 LAB — CREATININE, SERUM
Creatinine, Ser: 1.63 mg/dL — ABNORMAL HIGH (ref 0.61–1.24)
GFR, Estimated: 39 mL/min — ABNORMAL LOW (ref >60)

## 2022-11-03 LAB — BUN: BUN: 28 mg/dL — ABNORMAL HIGH (ref 8–23)

## 2022-11-03 SURGERY — LOWER EXTREMITY ANGIOGRAPHY
Anesthesia: Moderate Sedation | Laterality: Left

## 2022-11-03 SURGERY — EMBOLIZATION
Anesthesia: Moderate Sedation

## 2022-11-03 MED ORDER — HEPARIN SODIUM (PORCINE) 1000 UNIT/ML IJ SOLN
INTRAMUSCULAR | Status: DC | PRN
  Administered 2022-11-03: 3000 [IU] via INTRAVENOUS

## 2022-11-03 MED ORDER — FENTANYL CITRATE (PF) 100 MCG/2ML IJ SOLN
INTRAMUSCULAR | Status: AC
  Filled 2022-11-03: qty 2

## 2022-11-03 MED ORDER — SODIUM CHLORIDE 0.9 % IV SOLN: 250.0000 mL | INTRAVENOUS | Status: DC | PRN

## 2022-11-03 MED ORDER — CEFAZOLIN SODIUM-DEXTROSE 2-4 GM/100ML-% IV SOLN: 2.0000 g | INTRAVENOUS | Status: DC

## 2022-11-03 MED ORDER — MORPHINE SULFATE (PF) 4 MG/ML IV SOLN: 2.0000 mg | INTRAVENOUS | Status: DC | PRN

## 2022-11-03 MED ORDER — ONDANSETRON HCL 4 MG/2ML IJ SOLN: 4.0000 mg | Freq: Four times a day (QID) | INTRAMUSCULAR | Status: DC | PRN

## 2022-11-03 MED ORDER — SODIUM CHLORIDE 0.9 % IV SOLN: INTRAVENOUS | Status: DC

## 2022-11-03 MED ORDER — FENTANYL CITRATE (PF) 100 MCG/2ML IJ SOLN
INTRAMUSCULAR | Status: DC | PRN
  Administered 2022-11-03: 25 ug via INTRAVENOUS
  Administered 2022-11-03 (×2): 12.5 ug via INTRAVENOUS

## 2022-11-03 MED ORDER — LIDOCAINE HCL (PF) 1 % IJ SOLN
INTRAMUSCULAR | Status: DC | PRN
  Administered 2022-11-03: 10 mL via INTRADERMAL

## 2022-11-03 MED ORDER — HYDRALAZINE HCL 20 MG/ML IJ SOLN: 5.0000 mg | INTRAMUSCULAR | Status: DC | PRN

## 2022-11-03 MED ORDER — SODIUM CHLORIDE 0.9% FLUSH: 3.0000 mL | Freq: Two times a day (BID) | INTRAVENOUS | Status: DC

## 2022-11-03 MED ORDER — OXYCODONE HCL 5 MG PO TABS: 5.0000 mg | ORAL_TABLET | ORAL | Status: DC | PRN

## 2022-11-03 MED ORDER — SODIUM CHLORIDE 0.9% FLUSH: 3.0000 mL | INTRAVENOUS | Status: DC | PRN

## 2022-11-03 MED ORDER — MIDAZOLAM HCL 2 MG/2ML IJ SOLN
INTRAMUSCULAR | Status: AC
  Filled 2022-11-03: qty 2

## 2022-11-03 MED ORDER — ACETAMINOPHEN 325 MG PO TABS: 650.0000 mg | ORAL_TABLET | ORAL | Status: DC | PRN

## 2022-11-03 MED ORDER — HEPARIN (PORCINE) IN NACL 1000-0.9 UT/500ML-% IV SOLN
INTRAVENOUS | Status: DC | PRN
  Administered 2022-11-03: 1000 mL

## 2022-11-03 MED ORDER — LABETALOL HCL 5 MG/ML IV SOLN: 10.0000 mg | INTRAVENOUS | Status: DC | PRN

## 2022-11-03 MED ORDER — MIDAZOLAM HCL 2 MG/2ML IJ SOLN
INTRAMUSCULAR | Status: DC | PRN
  Administered 2022-11-03: 1 mg via INTRAVENOUS
  Administered 2022-11-03 (×2): .5 mg via INTRAVENOUS

## 2022-11-03 MED ORDER — HEPARIN SODIUM (PORCINE) 1000 UNIT/ML IJ SOLN
INTRAMUSCULAR | Status: AC
  Filled 2022-11-03: qty 10

## 2022-11-03 MED ORDER — VANCOMYCIN HCL IN DEXTROSE 1-5 GM/200ML-% IV SOLN
1000.0000 mg | Freq: Once | INTRAVENOUS | Status: AC
  Administered 2022-11-03: 1000 mg via INTRAVENOUS

## 2022-11-03 SURGICAL SUPPLY — 31 items
CATH ANGIO 5F PIGTAIL 65CM (CATHETERS) IMPLANT
CATH MICROCATH PRGRT 130CM (CATHETERS) IMPLANT
CATH MICROCATH PRGRT 2.8F 110 (CATHETERS) IMPLANT
CATH VS15FR (CATHETERS) IMPLANT
COIL 400 COMPLEX SOFT 4X15CM (Vascular Products) IMPLANT
COIL 400 COMPLEX STD 4X10CM (Vascular Products) IMPLANT
COIL 400 COMPLEX STD 5X12CM (Vascular Products) IMPLANT
COVER EZ STRL 42X30 (DRAPES) IMPLANT
COVER PROBE ULTRASOUND 5X96 (MISCELLANEOUS) IMPLANT
DEVICE STARCLOSE SE CLOSURE (Vascular Products) IMPLANT
DEVICE TORQUE .025-.038 (MISCELLANEOUS) IMPLANT
GLIDEWIRE ADV .014X300CM (WIRE) IMPLANT
GLIDEWIRE ADV .035X180CM (WIRE) IMPLANT
GLIDEWIRE GT ANGLED 45D (WIRE) IMPLANT
GOWN STRL REUS W/ TWL LRG LVL3 (GOWN DISPOSABLE) ×2 IMPLANT
GOWN STRL REUS W/TWL LRG LVL3 (GOWN DISPOSABLE) ×1
HANDLE DETACHMENT COIL (MISCELLANEOUS) IMPLANT
KIT MICROPUNCTURE NIT STIFF (SHEATH) IMPLANT
MICROCATH PROGREAT 130CM (CATHETERS) ×1
MICROCATH PROGREAT 2.8F 110 CM (CATHETERS) ×1
MICROSPHERE 800+75UM (Embolic) IMPLANT
NDL ENTRY 21GA 7CM ECHOTIP (NEEDLE) IMPLANT
NEEDLE ENTRY 21GA 7CM ECHOTIP (NEEDLE) ×1 IMPLANT
PACK ANGIOGRAPHY (CUSTOM PROCEDURE TRAY) ×2 IMPLANT
SET INTRO CAPELLA COAXIAL (SET/KITS/TRAYS/PACK) IMPLANT
SHEATH BRITE TIP 5FRX11 (SHEATH) IMPLANT
SYR MEDRAD MARK 7 150ML (SYRINGE) IMPLANT
TUBING CONTRAST HIGH PRESS 72 (TUBING) IMPLANT
WIRE G V18X300CM (WIRE) IMPLANT
WIRE GUIDERIGHT .035X150 (WIRE) IMPLANT
WIRE RUNTHROUGH .014X180CM (WIRE) IMPLANT

## 2022-11-03 NOTE — Op Note (Signed)
[facility]  Percutaneous Study/Intervention Procedural Note   Date of Surgery: 11/03/2022,3:55 PM  Surgeon:, Latina Craver   Pre-operative Diagnosis: Type II endoleak with increasing aneurysm sac size; abdominal aortic aneurysm status post stent graft repair  Post-operative diagnosis:  Same  Procedure(s) Performed:  1.  Introduction catheter tertiary branches of the left internal iliac artery third order catheter placement  2.  Coil embolization of paired lumbar arteries in association with installation of large 800 m PVC beads  3.  Star close left common femoral artery    Anesthesia: Conscious sedation was administered by the interventional radiology RN under my direct supervision. IV Versed plus fentanyl were utilized. Continuous ECG, pulse oximetry and blood pressure was monitored throughout the entire procedure. Conscious sedation was administered for a total of 111 minutes.  Sheath: 5 French Pinnacle 11 cm sheath retrograde left common femoral artery  Contrast: 45 cc   Fluoroscopy Time: 23.8 minutes  Indications: The patient has been followed status post aneurysm stent graft placement.  Although initially his aneurysm sac decrease significantly it is now reexpanded on his most recent follow-up exam.  CT scan was obtained which demonstrates a type II endoleak.  His aneurysm is now larger than it was prior to repair and therefore intervention is indicated.  Risks and benefits of been reviewed all questions were answered and the patient has agreed to proceed with endovascular repair of the endoleak.   Procedure:  Jay [name] a 89 y.o. male who was identified and appropriate procedural time out was performed.  The patient was then placed supine on the table and prepped and draped in the usual sterile fashion.  Ultrasound was used to evaluate the left common femoral artery.  It was echolucent and pulsatile indicating it is patent .  An ultrasound image  was acquired for the permanent record.  A micropuncture needle was used to access the left common femoral artery under direct ultrasound guidance.  The microwire was then advanced under fluoroscopic guidance without difficulty followed by the micro-sheath.  A 0.035 J wire was advanced without resistance and a 5Fr sheath was placed.  3000 units of heparin was given.  The left internal iliac artery is then selected with a V S1 catheter.  Multiple views are obtained given in different obliquities to identify the optimal angle.  The branches which appear to directly fill the sac are identified.  Using a combination of wires and a prograde catheter wire system the branch that appears to fill the aneurysm sac is selected.  Hand-injection of contrast through the prograde catheter is used to make selective imaging.  The wire and catheter were then negotiated through the aneurysm sac to the level of the outflow vein branch.  Once confirmation is made by hand-injection of contrast a total of 1-1/2 cc of 800 mcg beads were then instilled and then coils are deployed.  A total of 3 Ruby coils first a 4 mm x 15 cm soft coil was deployed followed by a 5 mm x 12 cm standard coil and lastly a 4 mm x 10 cm standard coil.  Coils are utilized.  Imaging from more proximal demonstrates occlusion of the flow and total resolution of the endoleak.  The pigtail catheter advanced over a J-wire just proximal to the stent and a bolus injection contrast is utilized verifying there is complete absence of endoleak.  Catheters were then removed J-wire is negotiated into the aorta and an LAO view of the left femoral  artery is obtained.  Star close is then deployed successfully.  Findings:   Aortogram: Stent graft in good position no evidence of type I or type III endoleak.  Selective pelvic angiography: Imaging via selective injection of the left internal iliac demonstrates branches that lead directly into the sac filling the endoleak  and an outflow branch.  Once peds and coils are deployed as noted above there is now total resolution of the endoleak    Disposition: Patient was taken to the recovery room in stable condition having tolerated the procedure well.  Jay [name]

## 2022-11-03 NOTE — Interval H&P Note (Signed)
History and Physical Interval Note:  11/03/2022 11:01 AM  Jay [name]  has presented today for surgery, with the diagnosis of L groin angio   Type II endoleak of aortic graft.  The various methods of treatment have been discussed with the patient and family. After consideration of risks, benefits and other options for treatment, the patient has consented to  Procedure(s): Lower Extremity Angiography (Left) as a surgical intervention.  The patient's history has been reviewed, patient examined, no change in status, stable for surgery.  I have reviewed the patient's chart and labs.  Questions were answered to the patient's satisfaction.     Levora Dredge

## 2022-11-04 ENCOUNTER — Encounter: Payer: Self-pay | Admitting: Vascular Surgery

## 2022-11-04 MED ORDER — IODIXANOL 320 MG/ML IV SOLN
INTRAVENOUS | Status: AC | PRN
  Administered 2022-11-03: 45 mL

## 2022-11-23 ENCOUNTER — Ambulatory Visit (INDEPENDENT_AMBULATORY_CARE_PROVIDER_SITE_OTHER): Payer: Medicare Other | Admitting: Vascular Surgery

## 2022-11-23 ENCOUNTER — Encounter (INDEPENDENT_AMBULATORY_CARE_PROVIDER_SITE_OTHER): Payer: Self-pay | Admitting: Vascular Surgery

## 2022-11-23 VITALS — BP 118/70 | HR 58 | Resp 15

## 2022-11-23 DIAGNOSIS — I10 Essential (primary) hypertension: Secondary | ICD-10-CM | POA: Diagnosis not present

## 2022-11-23 DIAGNOSIS — I9789 Other postprocedural complications and disorders of the circulatory system, not elsewhere classified: Secondary | ICD-10-CM | POA: Diagnosis not present

## 2022-11-23 DIAGNOSIS — I7133 Infrarenal abdominal aortic aneurysm, ruptured: Secondary | ICD-10-CM | POA: Diagnosis not present

## 2022-11-23 DIAGNOSIS — I2510 Atherosclerotic heart disease of native coronary artery without angina pectoris: Secondary | ICD-10-CM

## 2022-11-23 DIAGNOSIS — E785 Hyperlipidemia, unspecified: Secondary | ICD-10-CM

## 2022-11-23 DIAGNOSIS — I2583 Coronary atherosclerosis due to lipid rich plaque: Secondary | ICD-10-CM

## 2022-11-23 NOTE — Progress Notes (Signed)
MRN : [id number]  Jay [name] is a 89 y.o. (03-27-1926) male who presents with chief complaint of check circulation.  History of Present Illness:   The patient returns to the office for surveillance of an abdominal aortic aneurysm status post stent graft placement on 06/23/2016.   Recently he underwent angiography on November 03, 2022 for treatment of a type II endoleak with an expanding aneurysm sac.  Treatment was successful.  Patient denies abdominal pain or back pain, no other abdominal complaints. No groin related complaints. No symptoms consistent with distal embolization No changes in claudication distance or new rest pain symptoms.   There have been no interval changes in his overall healthcare since his last visit.   Patient denies amaurosis fugax or TIA symptoms.  The patient denies recent episodes of angina or shortness of breath.    Current Meds  Medication Sig   acetaminophen (TYLENOL) 650 MG CR tablet Take 1,300 mg by mouth every 8 (eight) hours as needed for pain.   albuterol (PROVENTIL HFA;VENTOLIN HFA) 108 (90 BASE) MCG/ACT inhaler Inhale 2 puffs into the lungs every 6 (six) hours as needed for wheezing or shortness of breath.   apixaban (ELIQUIS) 2.5 MG TABS tablet Take 1 tablet (2.5 mg total) by mouth 2 (two) times daily.   atorvastatin (LIPITOR) 10 MG tablet Take 10 mg by mouth daily at 6 PM.    [employer] PURELAX 17 GM/SCOOP powder Take by mouth.   furosemide (LASIX) 20 MG tablet Take 1 tablet (20 mg total) by mouth daily. Please call and schedule an appointment for further refills 1st attempt   HYDROcodone-acetaminophen (NORCO) 7.5-325 MG tablet Take 0.5-1 tablets by mouth 2 (two) times daily as needed.   levothyroxine (SYNTHROID, LEVOTHROID) 88 MCG tablet Take 88 mcg by mouth daily before breakfast.   mirtazapine (REMERON) 15 MG tablet Take 1 tablet by mouth daily.    Past Medical History:   Diagnosis Date   Allergic rhinitis    ,RAD wheezing   Cataracts, bilateral    Complication of anesthesia    hallucinations after aaa surgery and valve replacement   Diverticulosis    , ACBE, flex, in 2003   Dyslipidemia    Fracture of left clavicle    , Hx   Hearing loss    , Bilateral   History of kidney stones    h/o   Hypotension 11/2018   Hypothyroidism    Insomnia    OA (optic atrophy)    Osteoarthritis    Pelvic fracture (HCC)    , Hx   RBBB (right bundle branch block)    , seen on EKG   Varicose veins     Past Surgical History:  Procedure Laterality Date   ABDOMINAL AORTOGRAM N/A 06/22/2016   Procedure: Abdominal Aortogram;  Surgeon: Renford Dills, MD;  Location: [facility];  Service: Cardiovascular;  Laterality: N/A;   AORTIC INTERVENTION N/A 06/22/2016   Procedure: Aortic Intervention;  Surgeon: Renford Dills, MD;  Location: [facility];  Service: Cardiovascular;  Laterality: N/A;   AORTIC VALVE REPLACEMENT  2011   COW VALVE   CATARACT EXTRACTION W/PHACO Right 02/17/2016   Procedure: CATARACT EXTRACTION PHACO AND INTRAOCULAR LENS PLACEMENT (IOC);  Surgeon: Sherald Hess, MD;  Location: [facility];  Service: Ophthalmology;  Laterality: Right;  RIGHT   INGUINAL HERNIA REPAIR Bilateral    KYPHOPLASTY N/A 06/29/2019   Procedure: T12 KYPHOPLASTY;  Surgeon: Kennedy Bucker, MD;  Location: [facility];  Service: Orthopedics;  Laterality: N/A;   KYPHOPLASTY N/A 07/11/2019   Procedure: T11 KYPHOPLASTY;  Surgeon: Kennedy Bucker, MD;  Location: [facility];  Service: Orthopedics;  Laterality: N/A;   KYPHOPLASTY N/A 07/25/2019   Procedure: L1 KYPHOPLASTY;  Surgeon: Kennedy Bucker, MD;  Location: [facility];  Service: Orthopedics;  Laterality: N/A;   LOWER EXTREMITY ANGIOGRAPHY Left 11/03/2022   Procedure: Lower Extremity Angiography;  Surgeon: Renford Dills, MD;  Location: [facility];  Service: Cardiovascular;  Laterality: Left;    Orchectomy Right    PACEMAKER IMPLANT N/A 05/31/2017   Procedure: PACEMAKER IMPLANT;  Surgeon: Marinus Maw, MD;  Location: [facility];  Service: Cardiovascular;  Laterality: N/A;   TEMPORARY PACEMAKER N/A 05/31/2017   Procedure: TEMPORARY PACEMAKER;  Surgeon: Tonny Bollman, MD;  Location: [facility];  Service: Cardiovascular;  Laterality: N/A;   THYROIDECTOMY      Social History Social History   Tobacco Use   Smoking status: Former    Current packs/day: 0.00    Average packs/day: 0.5 packs/day for 30.0 years (15.0 ttl pk-yrs)    Types: Cigarettes    Start date: 03/31/1935    Quit date: 03/30/1965    Years since quitting: 57.6   Smokeless tobacco: Current    Types: Chew  Vaping Use   Vaping status: Never Used  Substance Use Topics   Alcohol use: Not Currently    Alcohol/week: 1.0 standard drink of alcohol    Types: 1 Shots of liquor per week   Drug use: No    Family History Family History  Problem Relation Age of Onset   Diabetes Mellitus II Mother     Allergies  Allergen Reactions   Aleve [Naproxen Sodium] Hives   Codeine Other (See Comments)    unknown   Penicillin G Benzathine     Pt doesn't remember reaction     REVIEW OF SYSTEMS (Negative unless checked)  Constitutional: [] Weight loss  [] Fever  [] Chills Cardiac: [] Chest pain   [] Chest pressure   [] Palpitations   [] Shortness of breath when laying flat   [] Shortness of breath with exertion. Vascular:  [x] Pain in legs with walking   [] Pain in legs at rest  [] History of DVT   [] Phlebitis   [] Swelling in legs   [] Varicose veins   [] Non-healing ulcers Pulmonary:   [] Uses home oxygen   [] Productive cough   [] Hemoptysis   [] Wheeze  [] COPD   [] Asthma Neurologic:  [] Dizziness   [] Seizures   [] History of stroke   [] History of TIA  [] Aphasia   [] Vissual changes   [] Weakness or numbness in arm   [] Weakness or numbness in leg Musculoskeletal:   [] Joint swelling   [] Joint pain   [] Low back pain Hematologic:   [] Easy bruising  [] Easy bleeding   [] Hypercoagulable state   [] Anemic Gastrointestinal:  [] Diarrhea   [] Vomiting  [] Gastroesophageal reflux/heartburn   [] Difficulty swallowing. Genitourinary:  [] Chronic kidney disease   [] Difficult urination  [] Frequent urination   [] Blood in urine Skin:  [] Rashes   [] Ulcers  Psychological:  [] History of anxiety   []  History  of major depression.  Physical Examination  Vitals:   11/23/22 1518  BP: 118/70  Pulse: (!) 58  Resp: 15   There is no height or weight on file to calculate BMI. Gen: WD/WN, NAD Head: [place]/AT, No temporalis wasting.  Ear/Nose/Throat: Hearing grossly intact, nares w/o erythema or drainage Eyes: PER, EOMI, sclera nonicteric.  Neck: Supple, no masses.  No bruit or JVD.  Pulmonary:  Good air movement, no audible wheezing, no use of accessory muscles.  Cardiac: RRR, normal S1, S2, no Murmurs. Vascular:  mild trophic changes, no open wounds Vessel Right Left  Radial Palpable Palpable  Gastrointestinal: soft, non-distended. No guarding/no peritoneal signs.  Musculoskeletal: M/S 5/5 throughout.  No visible deformity.  Neurologic: CN 2-12 intact. Pain and light touch intact in extremities.  Symmetrical.  Speech is fluent. Motor exam as listed above. Psychiatric: Judgment intact, Mood & affect appropriate for pt's clinical situation. Dermatologic: No rashes or ulcers noted.  No changes consistent with cellulitis.   CBC Lab Results  Component Value Date   WBC 4.4 09/18/2022   HGB 10.9 (L) 09/18/2022   HCT 33.2 (L) 09/18/2022   MCV 90 09/18/2022   PLT 124 (L) 09/18/2022    BMET    Component Value Date/Time   NA 139 09/18/2022 1237   K 4.0 09/18/2022 1237   CL 99 09/18/2022 1237   CO2 28 09/18/2022 1237   GLUCOSE 110 (H) 09/18/2022 1237   GLUCOSE 107 (H) 05/31/2020 1429   BUN 28 (H) 11/03/2022 1208   BUN 32 09/18/2022 1237   CREATININE 1.63 (H) 11/03/2022 1208   CALCIUM 8.5 (L) 09/18/2022 1237   GFRNONAA 39 (L) 11/03/2022  1208   GFRAA 60 (L) 06/23/2019 1601   CrCl cannot be calculated (Unknown ideal weight.).  COAG Lab Results  Component Value Date   INR 1.14 05/30/2017   INR 1.16 06/22/2016   INR 1.12 03/30/2016    Radiology PERIPHERAL VASCULAR CATHETERIZATION  Result Date: 11/03/2022 See surgical note for result.  CT Angio Abd/Pel w/ and/or w/o  Result Date: 10/26/2022 CLINICAL DATA:  Abdominal aortic aneurysm follow-up EXAM: CTA ABDOMEN AND PELVIS WITHOUT AND WITH CONTRAST TECHNIQUE: Multidetector CT imaging of the abdomen and pelvis was performed using the standard protocol during bolus administration of intravenous contrast. Multiplanar reconstructed images and MIPs were obtained and reviewed to evaluate the vascular anatomy. RADIATION DOSE REDUCTION: This exam was performed according to the departmental dose-optimization program which includes automated exposure control, adjustment of the mA and/or kV according to patient size and/or use of iterative reconstruction technique. CONTRAST:  75mL OMNIPAQUE IOHEXOL 350 MG/ML SOLN COMPARISON:  Prior CT scan of the abdomen and pelvis 06/23/2019 FINDINGS: VASCULAR Aorta: Fusiform aneurysmal dilation of the infrarenal abdominal aorta status post endovascular aortic repair. Bifurcated endoprosthesis extends from just below the renal arteries into the left common iliac artery and the right external iliac artery. Coil embolization of the right hypogastric artery. There is a delayed type 2 endoleak visible on the delayed phase imaging with patchy enhancement of the excluded aneurysm sac. The excluded aneurysm sac has significantly enlarged measuring 7.6 x 7.1 cm compared to 6.3 x 6.7 cm in March of 2021. Celiac: Patent without evidence of aneurysm, dissection, vasculitis or significant stenosis. SMA: Patent without evidence of aneurysm, dissection, vasculitis or significant stenosis. Renals: Solitary renal arteries bilaterally. Significantly angulated right renal artery  with mild to moderate stenosis due to calcified atherosclerotic plaque. The left renal artery is calcified at the origin but appears to  remain patent without significant stenosis. IMA: Appears occluded at the origin. Inflow: Patent without evidence of aneurysm, dissection, vasculitis or significant stenosis. Proximal Outflow: Limited evaluation due to delayed contrast bolus. Veins: No focal venous abnormality. Review of the MIP images confirms the above findings. NON-VASCULAR Lower chest: Centrilobular pulmonary emphysema. Dependent atelectasis. Mechanical aortic valve. Aortic and multivessel coronary artery atherosclerotic vascular calcifications. Partially imaged cardiac rhythm maintenance device leads in the right atrium and right ventricle. Hepatobiliary: Normal hepatic contour and morphology. No discrete hepatic lesion. High attenuation stones are present within the gallbladder lumen. No biliary ductal dilatation. Pancreas: Unremarkable. No pancreatic ductal dilatation or surrounding inflammatory changes. Spleen: Normal in size without focal abnormality. Adrenals/Urinary Tract: Adrenal glands are unremarkable. Kidneys are normal, without renal calculi, focal lesion, or hydronephrosis. Diffuse bladder wall thickening/trabeculation with multiple small diverticula. Stomach/Bowel: Large volume of stool in the rectum. Extensive colonic diverticulosis without evidence of active inflammation. No focal bowel wall thickening or evidence of obstruction. Large duodenal diverticulum. Lymphatic: No suspicious lymphadenopathy. Reproductive: Prostate is unremarkable. Other: No abdominal wall hernia or abnormality. No abdominopelvic ascites. Musculoskeletal: No acute fracture or aggressive appearing lytic or blastic osseous lesion. Prior cement augmentation at T11, T12 and L1. Chronic appearing compression fracture of L2. IMPRESSION: VASCULAR 1. Surgical changes of prior EVAR of infrarenal abdominal aortic aneurysm with  evidence of a delayed type 2 endoleak. The excluded aneurysm sac has enlarged over-the 3 years since the prior study now measuring 7.6 x 7.1 cm compared to 6.7 x 6.3 cm previously. 2. Extensive atherosclerotic plaque throughout all vascular beds. NON-VASCULAR 1. Centrilobular pulmonary emphysema. 2. Mechanical aortic valve. 3. Cholelithiasis. 4. Extensive colonic diverticulosis without evidence of active inflammation. 5. Interval cement augmentation of compression fractures at T11, T12 and L1. 6. Untreated L2 compression fracture without focal lucency is age indeterminate and may be acute, subacute or chronic. Aortic aneurysm NOS (ICD10-I71.9); aortic Atherosclerosis (ICD10-I70.0) and Emphysema (ICD10-J43.9). Electronically Signed   By: Malachy Moan M.D.   On: 10/26/2022 09:29     Assessment/Plan 1. Type II endoleak of aortic graft Recommend:  The patient is status post successful angiogram with intervention and embolization before endoleak.    No further invasive studies, angiography or surgery at this time The patient should continue walking and begin a more formal exercise program.  The patient should continue antiplatelet therapy and aggressive treatment of the lipid abnormalities  Continued surveillance is indicated as andrographolide changes are likely to progress with time.    Patient should undergo noninvasive studies as ordered. The patient will follow up with me to review the studies.  - VAS US AORTA/IVC/ILIACS; Future  2. Ruptured infrarenal abdominal aortic aneurysm (AAA) (HCC) Recommend:  Patient is status post successful endovascular repair of the AAA.   No further intervention is required at this time.   No endoleak is detected and the aneurysm sac is stable.  The patient will continue antiplatelet therapy as prescribed as well as aggressive management of hyperlipidemia. Exercise is again strongly encouraged.   However, endografts require continued surveillance with  ultrasound or CT scan. This is mandatory to detect any changes that allow repressurization of the aneurysm sac.  The patient is informed that this would be asymptomatic.  The patient is reminded that lifelong routine surveillance is a necessity with an endograft. Patient will continue to follow-up at the specified interval with ultrasound of the aorta. - VAS US AORTA/IVC/ILIACS; Future  3. Coronary artery disease due to lipid rich plaque Continue cardiac and antihypertensive  medications as already ordered and reviewed, no changes at this time.  Continue statin as ordered and reviewed, no changes at this time  Nitrates PRN for chest pain  4. Essential hypertension Continue antihypertensive medications as already ordered, these medications have been reviewed and there are no changes at this time.  5. Dyslipidemia Continue statin as ordered and reviewed, no changes at this time    Levora Dredge, MD  11/23/2022 3:37 PM

## 2022-11-24 ENCOUNTER — Ambulatory Visit (INDEPENDENT_AMBULATORY_CARE_PROVIDER_SITE_OTHER): Payer: Medicare Other

## 2022-11-24 DIAGNOSIS — I442 Atrioventricular block, complete: Secondary | ICD-10-CM | POA: Diagnosis not present

## 2022-11-24 LAB — CUP PACEART REMOTE DEVICE CHECK
Battery Remaining Longevity: 52 mo
Battery Remaining Percentage: 44 %
Battery Voltage: 2.98 V
Brady Statistic AP VP Percent: 0 %
Brady Statistic AP VS Percent: 0 %
Brady Statistic AS VP Percent: 0 %
Brady Statistic AS VS Percent: 0 %
Brady Statistic RA Percent Paced: 0 %
Brady Statistic RV Percent Paced: 55 %
Date Time Interrogation Session: 20240827020015
Implantable Lead Connection Status: 753985
Implantable Lead Connection Status: 753985
Implantable Lead Implant Date: 20190304
Implantable Lead Implant Date: 20190304
Implantable Lead Location: 753859
Implantable Lead Location: 753860
Implantable Pulse Generator Implant Date: 20190304
Lead Channel Impedance Value: 390 Ohm
Lead Channel Impedance Value: 590 Ohm
Lead Channel Pacing Threshold Amplitude: 0.625 V
Lead Channel Pacing Threshold Amplitude: 0.75 V
Lead Channel Pacing Threshold Pulse Width: 0.5 ms
Lead Channel Pacing Threshold Pulse Width: 0.5 ms
Lead Channel Sensing Intrinsic Amplitude: 4.4 mV
Lead Channel Sensing Intrinsic Amplitude: 7.5 mV
Lead Channel Setting Pacing Amplitude: 1.625
Lead Channel Setting Pacing Amplitude: 2.5 V
Lead Channel Setting Pacing Pulse Width: 0.5 ms
Lead Channel Setting Sensing Sensitivity: 4 mV
Pulse Gen Model: 2272
Pulse Gen Serial Number: 8992729

## 2022-12-07 NOTE — Progress Notes (Signed)
Remote pacemaker transmission.   

## 2023-01-10 NOTE — Progress Notes (Signed)
MRN : [id number]  Jay [name] is a 89 y.o. (15-Aug-1925) male who presents with chief complaint of check circulation.  History of Present Illness:   The patient returns to the office for surveillance of an abdominal aortic aneurysm status post stent graft placement on 06/23/2016.    Recently he underwent angiography on November 03, 2022 for treatment of a type II endoleak with an expanding aneurysm sac.  Treatment was successful.   Patient denies abdominal pain or back pain, no other abdominal complaints. No groin related complaints. No symptoms consistent with distal embolization No changes in claudication distance or new rest pain symptoms.    There have been no interval changes in his overall healthcare since his last visit.    Patient denies amaurosis fugax or TIA symptoms.  The patient denies recent episodes of angina or shortness of breath.   The duplex ultrasound of the aneurysm obtained today demonstrates a decrease in the size of the aneurysm and no evidence of endoleak  No outpatient medications have been marked as taking for the 01/11/23 encounter (Appointment) with Gilda Crease, Latina Craver, MD.    Past Medical History:  Diagnosis Date   Allergic rhinitis    ,RAD wheezing   Cataracts, bilateral    Complication of anesthesia    hallucinations after aaa surgery and valve replacement   Diverticulosis    , ACBE, flex, in 2003   Dyslipidemia    Fracture of left clavicle    , Hx   Hearing loss    , Bilateral   History of kidney stones    h/o   Hypotension 11/2018   Hypothyroidism    Insomnia    OA (optic atrophy)    Osteoarthritis    Pelvic fracture (HCC)    , Hx   RBBB (right bundle branch block)    , seen on EKG   Varicose veins     Past Surgical History:  Procedure Laterality Date   ABDOMINAL AORTOGRAM N/A 06/22/2016   Procedure: Abdominal Aortogram;  Surgeon: Renford Dills, MD;   Location: [facility];  Service: Cardiovascular;  Laterality: N/A;   AORTIC INTERVENTION N/A 06/22/2016   Procedure: Aortic Intervention;  Surgeon: Renford Dills, MD;  Location: [facility];  Service: Cardiovascular;  Laterality: N/A;   AORTIC VALVE REPLACEMENT  2011   COW VALVE   CATARACT EXTRACTION W/PHACO Right 02/17/2016   Procedure: CATARACT EXTRACTION PHACO AND INTRAOCULAR LENS PLACEMENT (IOC);  Surgeon: Sherald Hess, MD;  Location: [facility];  Service: Ophthalmology;  Laterality: Right;  RIGHT   INGUINAL HERNIA REPAIR Bilateral    KYPHOPLASTY N/A 06/29/2019   Procedure: T12 KYPHOPLASTY;  Surgeon: Kennedy Bucker, MD;  Location: [facility];  Service: Orthopedics;  Laterality: N/A;   KYPHOPLASTY N/A 07/11/2019   Procedure: T11 KYPHOPLASTY;  Surgeon: Kennedy Bucker, MD;  Location: [facility];  Service: Orthopedics;  Laterality: N/A;   KYPHOPLASTY N/A 07/25/2019   Procedure: L1 KYPHOPLASTY;  Surgeon: Kennedy Bucker, MD;  Location: [facility];  Service: Orthopedics;  Laterality: N/A;   LOWER EXTREMITY ANGIOGRAPHY Left 11/03/2022  Procedure: Lower Extremity Angiography;  Surgeon: Renford Dills, MD;  Location: [facility];  Service: Cardiovascular;  Laterality: Left;   Orchectomy Right    PACEMAKER IMPLANT N/A 05/31/2017   Procedure: PACEMAKER IMPLANT;  Surgeon: Marinus Maw, MD;  Location: [facility];  Service: Cardiovascular;  Laterality: N/A;   TEMPORARY PACEMAKER N/A 05/31/2017   Procedure: TEMPORARY PACEMAKER;  Surgeon: Tonny Bollman, MD;  Location: [facility] CV [facility];  Service: Cardiovascular;  Laterality: N/A;   THYROIDECTOMY      Social History Social History   Tobacco Use   Smoking status: Former    Current packs/day: 0.00    Average packs/day: 0.5 packs/day for 30.0 years (15.0 ttl pk-yrs)    Types: Cigarettes    Start date: 03/31/1935    Quit date: 03/30/1965    Years since quitting: 57.8   Smokeless tobacco: Current     Types: Chew  Vaping Use   Vaping status: Never Used  Substance Use Topics   Alcohol use: Not Currently    Alcohol/week: 1.0 standard drink of alcohol    Types: 1 Shots of liquor per week   Drug use: No    Family History Family History  Problem Relation Age of Onset   Diabetes Mellitus II Mother     Allergies  Allergen Reactions   Aleve [Naproxen Sodium] Hives   Codeine Other (See Comments)    unknown   Penicillin G Benzathine     Pt doesn't remember reaction     REVIEW OF SYSTEMS (Negative unless checked)  Constitutional: [] Weight loss  [] Fever  [] Chills Cardiac: [] Chest pain   [] Chest pressure   [] Palpitations   [] Shortness of breath when laying flat   [] Shortness of breath with exertion. Vascular:  [x] Pain in legs with walking   [] Pain in legs at rest  [] History of DVT   [] Phlebitis   [] Swelling in legs   [] Varicose veins   [] Non-healing ulcers Pulmonary:   [] Uses home oxygen   [] Productive cough   [] Hemoptysis   [] Wheeze  [] COPD   [] Asthma Neurologic:  [] Dizziness   [] Seizures   [] History of stroke   [] History of TIA  [] Aphasia   [] Vissual changes   [] Weakness or numbness in arm   [] Weakness or numbness in leg Musculoskeletal:   [] Joint swelling   [] Joint pain   [] Low back pain Hematologic:  [] Easy bruising  [] Easy bleeding   [] Hypercoagulable state   [] Anemic Gastrointestinal:  [] Diarrhea   [] Vomiting  [] Gastroesophageal reflux/heartburn   [] Difficulty swallowing. Genitourinary:  [] Chronic kidney disease   [] Difficult urination  [] Frequent urination   [] Blood in urine Skin:  [] Rashes   [] Ulcers  Psychological:  [] History of anxiety   []  History of major depression.  Physical Examination  There were no vitals filed for this visit. There is no height or weight on file to calculate BMI. Gen: WD/WN, NAD Head: [place]/AT, No temporalis wasting.  Ear/Nose/Throat: Hearing grossly intact, nares w/o erythema or drainage Eyes: PER, EOMI, sclera nonicteric.  Neck: Supple, no  masses.  No bruit or JVD.  Pulmonary:  Good air movement, no audible wheezing, no use of accessory muscles.  Cardiac: RRR, normal S1, S2, no Murmurs. Vascular:  mild trophic changes, no open wounds Vessel Right Left  Radial Palpable Palpable  PT Not Palpable Not Palpable  DP Not Palpable Not Palpable  Gastrointestinal: soft, non-distended. No guarding/no peritoneal signs.  Musculoskeletal: M/S 5/5 throughout.  No visible deformity.  Neurologic: CN 2-12 intact. Pain and light  touch intact in extremities.  Symmetrical.  Speech is fluent. Motor exam as listed above. Psychiatric: Judgment intact, Mood & affect appropriate for pt's clinical situation. Dermatologic: No rashes or ulcers noted.  No changes consistent with cellulitis.   CBC Lab Results  Component Value Date   WBC 4.4 09/18/2022   HGB 10.9 (L) 09/18/2022   HCT 33.2 (L) 09/18/2022   MCV 90 09/18/2022   PLT 124 (L) 09/18/2022    BMET    Component Value Date/Time   NA 139 09/18/2022 1237   K 4.0 09/18/2022 1237   CL 99 09/18/2022 1237   CO2 28 09/18/2022 1237   GLUCOSE 110 (H) 09/18/2022 1237   GLUCOSE 107 (H) 05/31/2020 1429   BUN 28 (H) 11/03/2022 1208   BUN 32 09/18/2022 1237   CREATININE 1.63 (H) 11/03/2022 1208   CALCIUM 8.5 (L) 09/18/2022 1237   GFRNONAA 39 (L) 11/03/2022 1208   GFRAA 60 (L) 06/23/2019 1601   CrCl cannot be calculated (Patient's most recent lab result is older than the maximum 21 days allowed.).  COAG Lab Results  Component Value Date   INR 1.14 05/30/2017   INR 1.16 06/22/2016   INR 1.12 03/30/2016    Radiology No results found.   Assessment/Plan 1. Type II endoleak of aortic graft Recommend:  Patient is status post successful endovascular repair of the AAA.   No further intervention is required at this time.   No endoleak is detected and the aneurysm sac is stable.  The patient will continue antiplatelet therapy as prescribed as well as aggressive management of  hyperlipidemia. Exercise is encouraged.   However, endografts require continued surveillance with ultrasound or CT scan. This is mandatory to detect any changes that allow repressurization of the aneurysm sac.  The patient is informed that this would be asymptomatic.  The patient is reminded that lifelong routine surveillance is a necessity with an endograft. Patient will continue to follow-up at the specified interval with ultrasound of the aorta.  2. Ruptured infrarenal abdominal aortic aneurysm (AAA) (HCC) Recommend:  Patient is status post successful endovascular repair of the AAA.   No further intervention is required at this time.   No endoleak is detected and the aneurysm sac is stable.  The patient will continue antiplatelet therapy as prescribed as well as aggressive management of hyperlipidemia. Exercise is encouraged.   However, endografts require continued surveillance with ultrasound or CT scan. This is mandatory to detect any changes that allow repressurization of the aneurysm sac.  The patient is informed that this would be asymptomatic.  The patient is reminded that lifelong routine surveillance is a necessity with an endograft. Patient will continue to follow-up at the specified interval with ultrasound of the aorta.  3. Essential hypertension Continue antihypertensive medications as already ordered, these medications have been reviewed and there are no changes at this time.  4. Coronary artery disease involving native coronary artery of native heart without angina pectoris Continue cardiac and antihypertensive medications as already ordered and reviewed, no changes at this time.  Continue statin as ordered and reviewed, no changes at this time  Nitrates PRN for chest pain  5. Paroxysmal atrial fibrillation (HCC) Continue antiarrhythmia medications as already ordered, these medications have been reviewed and there are no changes at this time.  Continue anticoagulation  as ordered by Cardiology Service    Levora Dredge, MD  01/10/2023 5:01 PM

## 2023-01-11 ENCOUNTER — Encounter (INDEPENDENT_AMBULATORY_CARE_PROVIDER_SITE_OTHER): Payer: Self-pay | Admitting: Vascular Surgery

## 2023-01-11 ENCOUNTER — Ambulatory Visit (INDEPENDENT_AMBULATORY_CARE_PROVIDER_SITE_OTHER): Payer: Medicare Other | Admitting: Vascular Surgery

## 2023-01-11 ENCOUNTER — Ambulatory Visit (INDEPENDENT_AMBULATORY_CARE_PROVIDER_SITE_OTHER): Payer: Medicare Other

## 2023-01-11 VITALS — BP 100/60 | HR 56 | Resp 15

## 2023-01-11 DIAGNOSIS — I2510 Atherosclerotic heart disease of native coronary artery without angina pectoris: Secondary | ICD-10-CM | POA: Diagnosis not present

## 2023-01-11 DIAGNOSIS — I7133 Infrarenal abdominal aortic aneurysm, ruptured: Secondary | ICD-10-CM | POA: Diagnosis not present

## 2023-01-11 DIAGNOSIS — I480 Paroxysmal atrial fibrillation: Secondary | ICD-10-CM

## 2023-01-11 DIAGNOSIS — I10 Essential (primary) hypertension: Secondary | ICD-10-CM

## 2023-01-11 DIAGNOSIS — I9789 Other postprocedural complications and disorders of the circulatory system, not elsewhere classified: Secondary | ICD-10-CM | POA: Diagnosis not present

## 2023-01-16 ENCOUNTER — Emergency Department (HOSPITAL_COMMUNITY): Payer: Medicare Other

## 2023-01-16 ENCOUNTER — Inpatient Hospital Stay (HOSPITAL_COMMUNITY)
Admission: EM | Admit: 2023-01-16 | Discharge: 2023-01-29 | DRG: 951 | Disposition: E | Payer: Medicare Other | Attending: Pulmonary Disease | Admitting: Pulmonary Disease

## 2023-01-16 DIAGNOSIS — Z885 Allergy status to narcotic agent status: Secondary | ICD-10-CM | POA: Diagnosis not present

## 2023-01-16 DIAGNOSIS — Z95828 Presence of other vascular implants and grafts: Secondary | ICD-10-CM

## 2023-01-16 DIAGNOSIS — I4819 Other persistent atrial fibrillation: Secondary | ICD-10-CM | POA: Diagnosis present

## 2023-01-16 DIAGNOSIS — Z952 Presence of prosthetic heart valve: Secondary | ICD-10-CM

## 2023-01-16 DIAGNOSIS — I469 Cardiac arrest, cause unspecified: Secondary | ICD-10-CM | POA: Diagnosis present

## 2023-01-16 DIAGNOSIS — Z515 Encounter for palliative care: Principal | ICD-10-CM

## 2023-01-16 DIAGNOSIS — E039 Hypothyroidism, unspecified: Secondary | ICD-10-CM | POA: Diagnosis present

## 2023-01-16 DIAGNOSIS — Z79899 Other long term (current) drug therapy: Secondary | ICD-10-CM

## 2023-01-16 DIAGNOSIS — Z880 Allergy status to penicillin: Secondary | ICD-10-CM | POA: Diagnosis not present

## 2023-01-16 DIAGNOSIS — Z7901 Long term (current) use of anticoagulants: Secondary | ICD-10-CM

## 2023-01-16 DIAGNOSIS — E872 Acidosis, unspecified: Secondary | ICD-10-CM | POA: Diagnosis present

## 2023-01-16 DIAGNOSIS — E785 Hyperlipidemia, unspecified: Secondary | ICD-10-CM | POA: Diagnosis present

## 2023-01-16 DIAGNOSIS — Z950 Presence of cardiac pacemaker: Secondary | ICD-10-CM | POA: Diagnosis not present

## 2023-01-16 DIAGNOSIS — I10 Essential (primary) hypertension: Secondary | ICD-10-CM | POA: Diagnosis present

## 2023-01-16 DIAGNOSIS — E876 Hypokalemia: Secondary | ICD-10-CM | POA: Diagnosis present

## 2023-01-16 DIAGNOSIS — Z79890 Hormone replacement therapy (postmenopausal): Secondary | ICD-10-CM

## 2023-01-16 DIAGNOSIS — Z886 Allergy status to analgesic agent status: Secondary | ICD-10-CM | POA: Diagnosis not present

## 2023-01-16 DIAGNOSIS — R579 Shock, unspecified: Secondary | ICD-10-CM | POA: Diagnosis present

## 2023-01-16 LAB — COMPREHENSIVE METABOLIC PANEL
ALT: 32 U/L (ref 0–44)
AST: 54 U/L — ABNORMAL HIGH (ref 15–41)
Albumin: <1.5 g/dL — ABNORMAL LOW (ref 3.5–5.0)
Alkaline Phosphatase: 76 U/L (ref 38–126)
Anion gap: 12 (ref 5–15)
BUN: 19 mg/dL (ref 8–23)
CO2: 10 mmol/L — ABNORMAL LOW (ref 22–32)
Calcium: 5 mg/dL — CL (ref 8.9–10.3)
Chloride: 119 mmol/L — ABNORMAL HIGH (ref 98–111)
Creatinine, Ser: 1.1 mg/dL (ref 0.61–1.24)
GFR, Estimated: >60 mL/min (ref >60)
Glucose, Bld: 178 mg/dL — ABNORMAL HIGH (ref 70–99)
Potassium: 3.1 mmol/L — ABNORMAL LOW (ref 3.5–5.1)
Sodium: 141 mmol/L (ref 135–145)
Total Bilirubin: 0.8 mg/dL (ref 0.3–1.2)
Total Protein: 3 g/dL — ABNORMAL LOW (ref 6.5–8.1)

## 2023-01-16 LAB — CBC WITH DIFFERENTIAL/PLATELET
Abs Immature Granulocytes: 0.18 10*3/uL — ABNORMAL HIGH (ref 0.00–0.07)
Basophils Absolute: 0 10*3/uL (ref 0.0–0.1)
Basophils Relative: 0 %
Eosinophils Absolute: 0 10*3/uL (ref 0.0–0.5)
Eosinophils Relative: 0 %
HCT: 25.7 % — ABNORMAL LOW (ref 39.0–52.0)
Hemoglobin: 7.3 g/dL — ABNORMAL LOW (ref 13.0–17.0)
Immature Granulocytes: 5 %
Lymphocytes Relative: 28 %
Lymphs Abs: 1 10*3/uL (ref 0.7–4.0)
MCH: 29.4 pg (ref 26.0–34.0)
MCHC: 28.4 g/dL — ABNORMAL LOW (ref 30.0–36.0)
MCV: 103.6 fL — ABNORMAL HIGH (ref 80.0–100.0)
Monocytes Absolute: 0.2 10*3/uL (ref 0.1–1.0)
Monocytes Relative: 5 %
Neutro Abs: 2.1 10*3/uL (ref 1.7–7.7)
Neutrophils Relative %: 62 %
Platelets: 69 10*3/uL — ABNORMAL LOW (ref 150–400)
RBC: 2.48 MIL/uL — ABNORMAL LOW (ref 4.22–5.81)
RDW: 15.3 % (ref 11.5–15.5)
Smear Review: DECREASED
WBC: 3.4 10*3/uL — ABNORMAL LOW (ref 4.0–10.5)
nRBC: 1.5 % — ABNORMAL HIGH (ref 0.0–0.2)

## 2023-01-16 LAB — I-STAT ARTERIAL BLOOD GAS, ED
Acid-base deficit: 17 mmol/L — ABNORMAL HIGH (ref 0.0–2.0)
Bicarbonate: 14.4 mmol/L — ABNORMAL LOW (ref 20.0–28.0)
Calcium, Ion: 1.03 mmol/L — ABNORMAL LOW (ref 1.15–1.40)
HCT: 38 % — ABNORMAL LOW (ref 39.0–52.0)
Hemoglobin: 12.9 g/dL — ABNORMAL LOW (ref 13.0–17.0)
O2 Saturation: 100 %
Patient temperature: 98.6
Potassium: 4.8 mmol/L (ref 3.5–5.1)
Sodium: 132 mmol/L — ABNORMAL LOW (ref 135–145)
TCO2: 16 mmol/L — ABNORMAL LOW (ref 22–32)
pCO2 arterial: 55 mm[Hg] — ABNORMAL HIGH (ref 32–48)
pH, Arterial: 7.025 — CL (ref 7.35–7.45)
pO2, Arterial: 258 mm[Hg] — ABNORMAL HIGH (ref 83–108)

## 2023-01-16 LAB — TYPE AND SCREEN
ABO/RH(D): O NEG
Antibody Screen: NEGATIVE

## 2023-01-16 LAB — CBG MONITORING, ED: Glucose-Capillary: 151 mg/dL — ABNORMAL HIGH (ref 70–99)

## 2023-01-16 LAB — TROPONIN I (HIGH SENSITIVITY)
Troponin I (High Sensitivity): 67 ng/L — ABNORMAL HIGH (ref <18)
Troponin I (High Sensitivity): 345 ng/L (ref <18)

## 2023-01-16 LAB — MAGNESIUM: Magnesium: 1.7 mg/dL (ref 1.7–2.4)

## 2023-01-16 MED ORDER — HYDROMORPHONE HCL-NACL 50-0.9 MG/50ML-% IV SOLN
0.0000 mg/h | INTRAVENOUS | Status: DC
  Filled 2023-01-16: qty 50

## 2023-01-16 MED ORDER — HYDROMORPHONE HCL PF 10 MG/ML IJ SOLN
0.0000 mg/h | INTRAMUSCULAR | Status: DC
  Filled 2023-01-16: qty 5

## 2023-01-16 MED ORDER — LACTATED RINGERS IV SOLN: INTRAVENOUS | Status: DC

## 2023-01-16 MED ORDER — LACTATED RINGERS IV BOLUS
1000.0000 mL | Freq: Once | INTRAVENOUS | Status: AC
  Administered 2023-01-16: 1000 mL via INTRAVENOUS

## 2023-01-16 MED ORDER — HYDROMORPHONE BOLUS VIA INFUSION: 1.0000 mg | INTRAVENOUS | Status: DC | PRN

## 2023-01-16 MED ORDER — MIDAZOLAM HCL 2 MG/2ML IJ SOLN
2.0000 mg | INTRAMUSCULAR | Status: DC | PRN
  Filled 2023-01-16: qty 2

## 2023-01-16 MED ORDER — NOREPINEPHRINE 4 MG/250ML-% IV SOLN
0.0000 ug/min | INTRAVENOUS | Status: DC
  Administered 2023-01-16: 5 ug/min via INTRAVENOUS
  Filled 2023-01-16: qty 250

## 2023-01-17 ENCOUNTER — Encounter (INDEPENDENT_AMBULATORY_CARE_PROVIDER_SITE_OTHER): Payer: Self-pay | Admitting: Vascular Surgery

## 2023-01-21 ENCOUNTER — Ambulatory Visit: Payer: Medicare Other | Admitting: Cardiology

## 2023-01-29 NOTE — ED Notes (Signed)
Family at bedside along with MD [facility] requesting discontinuation of advanced measures, transition to comfort care. Levo discontinued at this time.

## 2023-01-29 NOTE — ED Notes (Signed)
Pulse check + CPR discontinued

## 2023-01-29 NOTE — ED Provider Notes (Signed)
[place] EMERGENCY DEPARTMENT AT [facility] Provider Note   CSN: [id number] Arrival date & time: 2023/01/26  1901     History {Add pertinent medical, surgical, social history, OB history to HPI:1} Chief Complaint  Patient presents with  . CPR    Jay [name] is a 89 y.o. male.  h/o hypothyroidism, diverticulosis, HTN, HLD, orthostatic hypotension, RBBB, AAA s/p stent graft placement, AVR (bioprosthetic), & CHB s/p PPM  The history is provided by medical records and the EMS personnel.       Home Medications Prior to Admission medications   Medication Sig Start Date End Date Taking? Authorizing Provider  acetaminophen (TYLENOL) 650 MG CR tablet Take 1,300 mg by mouth every 8 (eight) hours as needed for pain.    [provider]  albuterol (PROVENTIL HFA;VENTOLIN HFA) 108 (90 BASE) MCG/ACT inhaler Inhale 2 puffs into the lungs every 6 (six) hours as needed for wheezing or shortness of breath.    [provider]  apixaban (ELIQUIS) 2.5 MG TABS tablet Take 1 tablet (2.5 mg total) by mouth 2 (two) times daily. 09/18/22   Jeanella Craze, NP  atorvastatin (LIPITOR) 10 MG tablet Take 10 mg by mouth daily at 6 PM.     [provider]  [employer] PURELAX 17 GM/SCOOP powder Take by mouth. 12/23/19   [provider]  furosemide (LASIX) 20 MG tablet Take 1 tablet (20 mg total) by mouth daily. Please call and schedule an appointment for further refills 1st attempt 11/18/20   Marinus Maw, MD  HYDROcodone-acetaminophen [facility]) 7.5-325 MG tablet Take 0.5-1 tablets by mouth 2 (two) times daily as needed. 11/14/19   [provider]  levothyroxine (SYNTHROID, LEVOTHROID) 88 MCG tablet Take 88 mcg by mouth daily before breakfast.    [provider]  mirtazapine (REMERON) 15 MG tablet Take 1 tablet by mouth daily. 10/22/19   [provider]  saw palmetto 500 MG capsule Take 500 mg by mouth daily. Patient not taking: Reported on  11/03/2022    [provider]      Allergies    Aleve [naproxen sodium], Codeine, and Penicillin g benzathine    Review of Systems   Review of Systems  Physical Exam Updated Vital Signs BP (!) 84/69   Resp (!) 23  Physical Exam  ED Results / Procedures / Treatments   Labs (all labs ordered are listed, but only abnormal results are displayed) Labs Reviewed  CBG MONITORING, ED - Abnormal; Notable for the following components:      Result Value   Glucose-Capillary 151 (*)    All other components within normal limits  CBC WITH DIFFERENTIAL/PLATELET  COMPREHENSIVE METABOLIC PANEL  MAGNESIUM  I-STAT ARTERIAL BLOOD GAS, ED  TYPE AND SCREEN  TROPONIN I (HIGH SENSITIVITY)    EKG EKG Interpretation Date/Time:  Saturday Jan 26, 2023 19:06:45 EDT Ventricular Rate:  100 PR Interval:  168 QRS Duration:  184 QT Interval:  439 QTC Calculation: 552 R Axis:   255  Text Interpretation: Electronic ventricular pacemaker Confirmed by Gwyneth Sprout ([id number]) on 26-Jan-2023 7:16:56 PM  Radiology No results found.  Procedures Procedures  {Document cardiac monitor, telemetry assessment procedure when appropriate:1}  Medications Ordered in ED Medications  norepinephrine (LEVOPHED) 4mg  in (0.016 mg/mL) premix infusion (5 mcg/min Intravenous New Bag/Given 26-Jan-2023 1916)  lactated ringers bolus 1,000 mL (has no administration in time range)  lactated ringers infusion (has no administration in time range)    ED Course/ Medical  Decision Making/ A&P   {   Click here for ABCD2, HEART and other calculatorsREFRESH Note before signing :1}                              Medical Decision Making Amount and/or Complexity of Data Reviewed Labs: ordered. Radiology: ordered.  Risk Prescription drug management.   ***  {Document critical care time when appropriate:1} {Document review of labs and clinical decision tools ie heart score, Chads2Vasc2 etc:1}  {Document your  independent review of radiology images, and any outside records:1} {Document your discussion with family members, caretakers, and with consultants:1} {Document social determinants of health affecting pt's care:1} {Document your decision making why or why not admission, treatments were needed:1} Final Clinical Impression(s) / ED Diagnoses Final diagnoses:  None    Rx / DC Orders ED Discharge Orders     None

## 2023-01-29 NOTE — ED Notes (Signed)
Palliative extubation completed with RT

## 2023-01-29 NOTE — ED Notes (Addendum)
PEA, CPR restarted

## 2023-01-29 NOTE — ED Triage Notes (Addendum)
EMS report pt found unresponsive by family at 6pm, pulseless/PEA upon arrival. CPR started at 1802. 20g LAC, IO RT, 6 ETT placed. 1000L NS bolus, 6 epi pushes administered in route. EMS reports one episode of ROSC lasting approx 5 minutes with pulses lost again and CPR restarted. No shocks delivered. CBG 238. CPR in progress at time of arrival.  Family report fall earlier in the day, pt is on eliquis.

## 2023-01-29 NOTE — ED Notes (Addendum)
TOD: 22:13. Confirmed with Christean Leaf RN

## 2023-01-29 NOTE — Procedures (Signed)
Extubation Procedure Note  Patient Details:   Name: Jay [name] DOB: 22-Nov-1925 MRN: [id number]   Airway Documentation:  Airway 6 mm (Active)  Secured at (cm) 23 cm 01-24-2023 1920  Measured From Lips 24-Jan-2023 1920  Secured Location Center 2023-01-24 1920  Secured By [employer] 01-24-23 1920  Prone position No 01-24-2023 1920  Cuff Pressure (cm H2O) Red OR 40-60 CmH2O 01-24-23 1920  Site Condition Dry 01/24/23 1920   Vent end date: (not recorded) Vent end time: (not recorded)   Evaluation  O2 sats: currently acceptable Complications: No apparent complications Patient did tolerate procedure well. Bilateral Breath Sounds: Clear, Diminished   No Pt terminally extubated per MD and family.  Ranae Plumber Quianna Avery 01-24-23, 10:31 PM

## 2023-01-29 NOTE — ED Notes (Signed)
Rosc achieved. Family updated by Dr. Anitra Lauth and at bedside.

## 2023-01-29 DEATH — deceased

## 2023-02-23 ENCOUNTER — Ambulatory Visit: Payer: Medicare Other

## 2023-05-25 ENCOUNTER — Ambulatory Visit: Payer: Medicare Other

## 2023-07-12 ENCOUNTER — Encounter (INDEPENDENT_AMBULATORY_CARE_PROVIDER_SITE_OTHER): Payer: Medicare Other

## 2023-07-12 ENCOUNTER — Ambulatory Visit (INDEPENDENT_AMBULATORY_CARE_PROVIDER_SITE_OTHER): Payer: Medicare Other | Admitting: Vascular Surgery

## 2023-08-24 ENCOUNTER — Ambulatory Visit: Payer: Medicare Other

## 2023-11-23 ENCOUNTER — Ambulatory Visit: Payer: Medicare Other

## 2024-02-22 ENCOUNTER — Ambulatory Visit: Payer: Medicare Other
